# Patient Record
Sex: Male | Born: 1940 | Race: White | Hispanic: No | Marital: Married | Smoking: Never smoker
Health system: Southern US, Community
[De-identification: ages and names within clinical notes are randomized; demographics above are authoritative.]

## PROBLEM LIST (undated history)

## (undated) DIAGNOSIS — I5042 Chronic combined systolic (congestive) and diastolic (congestive) heart failure: Secondary | ICD-10-CM

## (undated) DIAGNOSIS — E785 Hyperlipidemia, unspecified: Secondary | ICD-10-CM

## (undated) DIAGNOSIS — N419 Inflammatory disease of prostate, unspecified: Secondary | ICD-10-CM

## (undated) DIAGNOSIS — N400 Enlarged prostate without lower urinary tract symptoms: Secondary | ICD-10-CM

## (undated) DIAGNOSIS — C4490 Unspecified malignant neoplasm of skin, unspecified: Secondary | ICD-10-CM

## (undated) DIAGNOSIS — M6530 Trigger finger, unspecified finger: Secondary | ICD-10-CM

## (undated) DIAGNOSIS — I2510 Atherosclerotic heart disease of native coronary artery without angina pectoris: Secondary | ICD-10-CM

## (undated) DIAGNOSIS — N200 Calculus of kidney: Secondary | ICD-10-CM

## (undated) HISTORY — DX: Unspecified malignant neoplasm of skin, unspecified: C44.90

## (undated) HISTORY — PX: CARDIAC CATHETERIZATION: SHX172

## (undated) HISTORY — DX: Calculus of kidney: N20.0

## (undated) HISTORY — DX: Chronic combined systolic (congestive) and diastolic (congestive) heart failure: I50.42

## (undated) HISTORY — DX: Trigger finger, unspecified finger: M65.30

## (undated) HISTORY — DX: Benign prostatic hyperplasia without lower urinary tract symptoms: N40.0

## (undated) HISTORY — DX: Hyperlipidemia, unspecified: E78.5

---

## 2005-03-11 ENCOUNTER — Encounter: Admission: RE | Admit: 2005-03-11 | Discharge: 2005-03-11 | Payer: Self-pay | Admitting: Family Medicine

## 2007-07-09 DIAGNOSIS — C4492 Squamous cell carcinoma of skin, unspecified: Secondary | ICD-10-CM

## 2007-07-09 HISTORY — DX: Squamous cell carcinoma of skin, unspecified: C44.92

## 2011-02-20 ENCOUNTER — Emergency Department (HOSPITAL_COMMUNITY)
Admission: EM | Admit: 2011-02-20 | Discharge: 2011-02-21 | Disposition: A | Discharge status: Home / Self Care | Payer: Medicare Other | Attending: Emergency Medicine | Admitting: Emergency Medicine

## 2011-02-20 DIAGNOSIS — R109 Unspecified abdominal pain: Secondary | ICD-10-CM | POA: Insufficient documentation

## 2011-02-20 DIAGNOSIS — N400 Enlarged prostate without lower urinary tract symptoms: Secondary | ICD-10-CM | POA: Insufficient documentation

## 2011-02-20 DIAGNOSIS — N201 Calculus of ureter: Secondary | ICD-10-CM | POA: Insufficient documentation

## 2011-02-20 DIAGNOSIS — R3129 Other microscopic hematuria: Secondary | ICD-10-CM | POA: Insufficient documentation

## 2011-02-21 ENCOUNTER — Emergency Department (HOSPITAL_COMMUNITY): Payer: Medicare Other

## 2011-02-21 ENCOUNTER — Encounter (HOSPITAL_COMMUNITY): Payer: Self-pay

## 2011-02-21 LAB — URINALYSIS, ROUTINE W REFLEX MICROSCOPIC
Ketones, ur: NEGATIVE mg/dL
Protein, ur: NEGATIVE mg/dL
Urobilinogen, UA: 0.2 mg/dL (ref 0.0–1.0)

## 2011-02-21 LAB — URINE MICROSCOPIC-ADD ON

## 2011-02-23 LAB — URINE CULTURE
Colony Count: 50000
Culture  Setup Time: 201206140453

## 2011-12-04 ENCOUNTER — Other Ambulatory Visit: Payer: Self-pay | Admitting: Family Medicine

## 2011-12-04 ENCOUNTER — Ambulatory Visit
Admission: RE | Admit: 2011-12-04 | Discharge: 2011-12-04 | Disposition: A | Discharge status: Home / Self Care | Payer: Medicare Other | Source: Ambulatory Visit | Attending: Family Medicine | Admitting: Family Medicine

## 2011-12-04 DIAGNOSIS — R1084 Generalized abdominal pain: Secondary | ICD-10-CM

## 2011-12-04 MED ORDER — IOHEXOL 300 MG/ML  SOLN
100.0000 mL | Freq: Once | INTRAMUSCULAR | Status: AC | PRN
  Administered 2011-12-04: 100 mL via INTRAVENOUS

## 2014-03-23 ENCOUNTER — Encounter: Payer: Self-pay | Admitting: *Deleted

## 2015-11-06 DIAGNOSIS — H35371 Puckering of macula, right eye: Secondary | ICD-10-CM | POA: Diagnosis not present

## 2015-11-06 DIAGNOSIS — Z961 Presence of intraocular lens: Secondary | ICD-10-CM | POA: Diagnosis not present

## 2015-11-09 DIAGNOSIS — H35371 Puckering of macula, right eye: Secondary | ICD-10-CM | POA: Diagnosis not present

## 2015-11-12 DIAGNOSIS — R358 Other polyuria: Secondary | ICD-10-CM | POA: Diagnosis not present

## 2015-11-24 DIAGNOSIS — N410 Acute prostatitis: Secondary | ICD-10-CM | POA: Diagnosis not present

## 2015-11-24 DIAGNOSIS — N200 Calculus of kidney: Secondary | ICD-10-CM | POA: Diagnosis not present

## 2015-11-24 DIAGNOSIS — N401 Enlarged prostate with lower urinary tract symptoms: Secondary | ICD-10-CM | POA: Diagnosis not present

## 2015-11-24 DIAGNOSIS — R350 Frequency of micturition: Secondary | ICD-10-CM | POA: Diagnosis not present

## 2015-11-24 DIAGNOSIS — R972 Elevated prostate specific antigen [PSA]: Secondary | ICD-10-CM | POA: Diagnosis not present

## 2015-11-24 DIAGNOSIS — N3941 Urge incontinence: Secondary | ICD-10-CM | POA: Diagnosis not present

## 2015-11-24 DIAGNOSIS — R3915 Urgency of urination: Secondary | ICD-10-CM | POA: Diagnosis not present

## 2015-12-04 DIAGNOSIS — N419 Inflammatory disease of prostate, unspecified: Secondary | ICD-10-CM | POA: Diagnosis not present

## 2015-12-11 DIAGNOSIS — R339 Retention of urine, unspecified: Secondary | ICD-10-CM | POA: Diagnosis not present

## 2015-12-11 DIAGNOSIS — R338 Other retention of urine: Secondary | ICD-10-CM | POA: Diagnosis not present

## 2015-12-11 DIAGNOSIS — N401 Enlarged prostate with lower urinary tract symptoms: Secondary | ICD-10-CM | POA: Diagnosis not present

## 2015-12-13 DIAGNOSIS — N400 Enlarged prostate without lower urinary tract symptoms: Secondary | ICD-10-CM | POA: Diagnosis not present

## 2015-12-13 DIAGNOSIS — R195 Other fecal abnormalities: Secondary | ICD-10-CM | POA: Diagnosis not present

## 2015-12-13 DIAGNOSIS — K5909 Other constipation: Secondary | ICD-10-CM | POA: Diagnosis not present

## 2015-12-13 DIAGNOSIS — R1030 Lower abdominal pain, unspecified: Secondary | ICD-10-CM | POA: Diagnosis not present

## 2015-12-15 DIAGNOSIS — N401 Enlarged prostate with lower urinary tract symptoms: Secondary | ICD-10-CM | POA: Diagnosis not present

## 2015-12-15 DIAGNOSIS — R339 Retention of urine, unspecified: Secondary | ICD-10-CM | POA: Diagnosis not present

## 2015-12-19 DIAGNOSIS — R339 Retention of urine, unspecified: Secondary | ICD-10-CM | POA: Diagnosis not present

## 2015-12-21 DIAGNOSIS — F419 Anxiety disorder, unspecified: Secondary | ICD-10-CM | POA: Diagnosis not present

## 2015-12-21 DIAGNOSIS — R339 Retention of urine, unspecified: Secondary | ICD-10-CM | POA: Diagnosis not present

## 2015-12-21 DIAGNOSIS — K5900 Constipation, unspecified: Secondary | ICD-10-CM | POA: Diagnosis not present

## 2015-12-21 DIAGNOSIS — N400 Enlarged prostate without lower urinary tract symptoms: Secondary | ICD-10-CM | POA: Diagnosis not present

## 2015-12-27 DIAGNOSIS — Z1211 Encounter for screening for malignant neoplasm of colon: Secondary | ICD-10-CM | POA: Diagnosis not present

## 2015-12-27 DIAGNOSIS — K5900 Constipation, unspecified: Secondary | ICD-10-CM | POA: Diagnosis not present

## 2016-01-03 ENCOUNTER — Encounter (HOSPITAL_COMMUNITY): Payer: Self-pay

## 2016-01-03 ENCOUNTER — Emergency Department (HOSPITAL_COMMUNITY)
Admission: EM | Admit: 2016-01-03 | Discharge: 2016-01-03 | Disposition: A | Discharge status: Home / Self Care | Payer: Medicare Other | Attending: Emergency Medicine | Admitting: Emergency Medicine

## 2016-01-03 ENCOUNTER — Emergency Department (HOSPITAL_COMMUNITY): Payer: Medicare Other

## 2016-01-03 DIAGNOSIS — R339 Retention of urine, unspecified: Secondary | ICD-10-CM | POA: Insufficient documentation

## 2016-01-03 DIAGNOSIS — Z7982 Long term (current) use of aspirin: Secondary | ICD-10-CM | POA: Insufficient documentation

## 2016-01-03 DIAGNOSIS — R109 Unspecified abdominal pain: Secondary | ICD-10-CM | POA: Diagnosis not present

## 2016-01-03 DIAGNOSIS — N401 Enlarged prostate with lower urinary tract symptoms: Secondary | ICD-10-CM | POA: Insufficient documentation

## 2016-01-03 DIAGNOSIS — N200 Calculus of kidney: Secondary | ICD-10-CM | POA: Insufficient documentation

## 2016-01-03 DIAGNOSIS — Z85828 Personal history of other malignant neoplasm of skin: Secondary | ICD-10-CM | POA: Diagnosis not present

## 2016-01-03 DIAGNOSIS — K5900 Constipation, unspecified: Secondary | ICD-10-CM | POA: Insufficient documentation

## 2016-01-03 DIAGNOSIS — Z8739 Personal history of other diseases of the musculoskeletal system and connective tissue: Secondary | ICD-10-CM | POA: Diagnosis not present

## 2016-01-03 DIAGNOSIS — Z79899 Other long term (current) drug therapy: Secondary | ICD-10-CM | POA: Insufficient documentation

## 2016-01-03 DIAGNOSIS — E785 Hyperlipidemia, unspecified: Secondary | ICD-10-CM | POA: Insufficient documentation

## 2016-01-03 HISTORY — DX: Inflammatory disease of prostate, unspecified: N41.9

## 2016-01-03 LAB — CBC WITH DIFFERENTIAL/PLATELET
BASOS ABS: 0 10*3/uL (ref 0.0–0.1)
Basophils Relative: 0 %
Eosinophils Absolute: 0.1 10*3/uL (ref 0.0–0.7)
Eosinophils Relative: 1 %
HEMATOCRIT: 41.3 % (ref 39.0–52.0)
Hemoglobin: 14.1 g/dL (ref 13.0–17.0)
LYMPHS PCT: 15 %
Lymphs Abs: 1.6 10*3/uL (ref 0.7–4.0)
MCH: 30.3 pg (ref 26.0–34.0)
MCHC: 34.1 g/dL (ref 30.0–36.0)
MCV: 88.6 fL (ref 78.0–100.0)
MONO ABS: 0.9 10*3/uL (ref 0.1–1.0)
MONOS PCT: 8 %
NEUTROS ABS: 8.5 10*3/uL — AB (ref 1.7–7.7)
Neutrophils Relative %: 76 %
Platelets: 173 10*3/uL (ref 150–400)
RBC: 4.66 MIL/uL (ref 4.22–5.81)
RDW: 13 % (ref 11.5–15.5)
WBC: 11.1 10*3/uL — ABNORMAL HIGH (ref 4.0–10.5)

## 2016-01-03 LAB — COMPREHENSIVE METABOLIC PANEL
ALK PHOS: 53 U/L (ref 38–126)
ALT: 19 U/L (ref 17–63)
AST: 19 U/L (ref 15–41)
Albumin: 4.8 g/dL (ref 3.5–5.0)
Anion gap: 12 (ref 5–15)
BILIRUBIN TOTAL: 0.7 mg/dL (ref 0.3–1.2)
BUN: 25 mg/dL — AB (ref 6–20)
CALCIUM: 9.7 mg/dL (ref 8.9–10.3)
CO2: 26 mmol/L (ref 22–32)
Chloride: 103 mmol/L (ref 101–111)
Creatinine, Ser: 1.47 mg/dL — ABNORMAL HIGH (ref 0.61–1.24)
GFR calc Af Amer: 52 mL/min — ABNORMAL LOW (ref >60)
GFR, EST NON AFRICAN AMERICAN: 45 mL/min — AB (ref >60)
Glucose, Bld: 132 mg/dL — ABNORMAL HIGH (ref 65–99)
POTASSIUM: 4.5 mmol/L (ref 3.5–5.1)
Sodium: 141 mmol/L (ref 135–145)
TOTAL PROTEIN: 7.3 g/dL (ref 6.5–8.1)

## 2016-01-03 LAB — URINALYSIS, ROUTINE W REFLEX MICROSCOPIC
Bilirubin Urine: NEGATIVE
GLUCOSE, UA: NEGATIVE mg/dL
Ketones, ur: NEGATIVE mg/dL
Nitrite: NEGATIVE
PH: 5.5 (ref 5.0–8.0)
Protein, ur: NEGATIVE mg/dL
Specific Gravity, Urine: 1.025 (ref 1.005–1.030)

## 2016-01-03 LAB — URINE MICROSCOPIC-ADD ON
Bacteria, UA: NONE SEEN
Squamous Epithelial / LPF: NONE SEEN

## 2016-01-03 LAB — LIPASE, BLOOD: LIPASE: 29 U/L (ref 11–51)

## 2016-01-03 MED ORDER — KETOROLAC TROMETHAMINE 30 MG/ML IJ SOLN: 30.0000 mg | Freq: Once | INTRAMUSCULAR | Status: DC

## 2016-01-03 MED ORDER — HYDROCODONE-ACETAMINOPHEN 5-325 MG PO TABS: 0.5000 | ORAL_TABLET | Freq: Four times a day (QID) | ORAL | Status: DC | PRN

## 2016-01-03 MED ORDER — ONDANSETRON HCL 4 MG PO TABS: 4.0000 mg | ORAL_TABLET | Freq: Three times a day (TID) | ORAL | Status: DC | PRN

## 2016-01-03 MED ORDER — DOCUSATE SODIUM 100 MG PO CAPS: 100.0000 mg | ORAL_CAPSULE | Freq: Two times a day (BID) | ORAL | Status: DC

## 2016-01-03 MED ORDER — HYDROCODONE-ACETAMINOPHEN 5-325 MG PO TABS
0.5000 | ORAL_TABLET | Freq: Once | ORAL | Status: AC
  Administered 2016-01-03: 0.5 via ORAL
  Filled 2016-01-03: qty 1

## 2016-01-03 MED ORDER — KETOROLAC TROMETHAMINE 30 MG/ML IJ SOLN
15.0000 mg | Freq: Once | INTRAMUSCULAR | Status: AC
  Administered 2016-01-03: 15 mg via INTRAVENOUS
  Filled 2016-01-03: qty 1

## 2016-01-03 MED ORDER — ONDANSETRON HCL 4 MG/2ML IJ SOLN
4.0000 mg | Freq: Once | INTRAMUSCULAR | Status: AC
  Administered 2016-01-03: 4 mg via INTRAVENOUS
  Filled 2016-01-03: qty 2

## 2016-01-03 MED ORDER — FENTANYL CITRATE (PF) 100 MCG/2ML IJ SOLN
50.0000 ug | Freq: Once | INTRAMUSCULAR | Status: AC
  Administered 2016-01-03: 50 ug via INTRAVENOUS
  Filled 2016-01-03: qty 2

## 2016-01-03 MED ORDER — SODIUM CHLORIDE 0.9 % IV BOLUS (SEPSIS)
1000.0000 mL | Freq: Once | INTRAVENOUS | Status: AC
  Administered 2016-01-03: 1000 mL via INTRAVENOUS

## 2016-01-03 MED ORDER — IBUPROFEN 400 MG PO TABS: 400.0000 mg | ORAL_TABLET | Freq: Three times a day (TID) | ORAL | Status: AC

## 2016-01-03 NOTE — ED Notes (Signed)
Patient c/o right flank pain since yesterday. Patient has a history of kidney stones. Patient has prostatitis and has been doing self cathing at home.

## 2016-01-03 NOTE — ED Provider Notes (Signed)
CSN: [id number]     Arrival date & time 01/03/16  [id number] History   First MD Initiated Contact with Patient 01/03/16 [phone]     Chief Complaint  Patient presents with  . Flank Pain     (Consider location/radiation/quality/duration/timing/severity/associated sxs/prior Treatment) Patient is a 75 y.o. James [name] presenting with flank pain.  Flank Pain This is a new problem. The current episode started 6 to 12 hours ago. The problem occurs constantly. The problem has been gradually worsening. Associated symptoms include abdominal pain. Pertinent negatives include no chest pain and no shortness of breath. He has tried nothing for the symptoms.    Past Medical History  Diagnosis Date  . Hyperlipidemia   . Kidney stone   . BPH (benign prostatic hypertrophy)   . Skin cancer   . Trigger finger   . Prostatitis    No past surgical history on file. Family History  Problem Relation Age of Onset  . Ovarian cancer Mother   . Heart disease Neg Hx    Social History  Substance Use Topics  . Smoking status: Never Smoker   . Smokeless tobacco: Never Used  . Alcohol Use: No    Review of Systems  Respiratory: Negative for cough and shortness of breath.   Cardiovascular: Negative for chest pain.  Gastrointestinal: Positive for nausea, abdominal pain and constipation. Negative for vomiting.  Genitourinary: Positive for flank pain.  Musculoskeletal: Negative for back pain and gait problem.  All other systems reviewed and are negative.     Allergies  Dicyclomine hcl and Sulfa antibiotics  Home Medications   Prior to Admission medications   Medication Sig Start Date End Date Taking? Authorizing Provider  ALPRAZolam (XANAX) 0.25 MG tablet Take 0.25 mg by mouth 3 (three) times daily as needed. 12/21/15  Yes Historical Provider, MD  aspirin EC 81 MG tablet Take 81 mg by mouth daily.   Yes Historical Provider, MD  atorvastatin (LIPITOR) 10 MG tablet Take 10 mg by mouth daily at 6 PM.    Yes Historical  Provider, MD  Multiple Vitamin (MULTIVITAMIN WITH MINERALS) TABS tablet Take 1 tablet by mouth daily.   Yes Historical Provider, MD  Omega-3 Fatty Acids (FISH OIL) 1000 MG CAPS Take 1,000 mg by mouth at bedtime.    Yes Historical Provider, MD  tamsulosin (FLOMAX) 0.4 MG CAPS capsule Take 0.4 mg by mouth 2 (two) times daily.    Yes Historical Provider, MD  docusate sodium (COLACE) 100 MG capsule Take 1 capsule (100 mg total) by mouth every 12 (twelve) hours. 01/03/16   [name], MD  HYDROcodone-acetaminophen (NORCO/VICODIN) 5-325 MG tablet Take 0.5-1 tablets by mouth every 6 (six) hours as needed for severe pain. 01/03/16   [name], MD  ibuprofen (ADVIL,MOTRIN) 400 MG tablet Take 1 tablet (400 mg total) by mouth 3 (three) times daily. 01/03/16 01/10/16  [name], MD  ondansetron (ZOFRAN) 4 MG tablet Take 1 tablet (4 mg total) by mouth every 8 (eight) hours as needed for nausea or vomiting. 01/03/16   [name], MD   BP 128/74 mmHg  Pulse 78  Temp(Src) 98.3 F (36.8 C) (Oral)  Resp 16  Ht 5\' 7"  (1.702 m)  Wt 178 lb (80.74 kg)  BMI 27.87 kg/m2  SpO2 100% Physical Exam  Constitutional: He is oriented to person, place, and time. He appears well-developed and well-nourished.  HENT:  Head: Normocephalic and atraumatic.  Neck: Normal range of motion.  Cardiovascular: Normal rate.   Pulmonary/Chest: Effort normal. No  respiratory distress. He has no wheezes.  Abdominal: Soft. He exhibits no distension. There is no tenderness. There is no rebound.  Musculoskeletal: Normal range of motion. He exhibits no edema or tenderness.  Neurological: He is alert and oriented to person, place, and time. No cranial nerve deficit. Coordination normal.  Skin: Skin is warm and dry.  Nursing note and vitals reviewed.   ED Course  Procedures (including critical care time) Labs Review Labs Reviewed  URINALYSIS, ROUTINE W REFLEX MICROSCOPIC (NOT AT [facility]) - Abnormal; Notable for the following:     APPearance CLOUDY (*)    Hgb urine dipstick LARGE (*)    Leukocytes, UA SMALL (*)    All other components within normal limits  CBC WITH DIFFERENTIAL/PLATELET - Abnormal; Notable for the following:    WBC 11.1 (*)    Neutro Abs 8.5 (*)    All other components within normal limits  COMPREHENSIVE METABOLIC PANEL - Abnormal; Notable for the following:    Glucose, Bld 132 (*)    BUN 25 (*)    Creatinine, Ser 1.47 (*)    GFR calc non Af Amer 45 (*)    GFR calc Af Amer 52 (*)    All other components within normal limits  LIPASE, BLOOD  URINE MICROSCOPIC-ADD ON    Imaging Review Ct Renal Stone Study  01/03/2016  CLINICAL DATA:  Left-sided pain started this morning, right-sided flank pain, vomiting. EXAM: CT ABDOMEN AND PELVIS WITHOUT CONTRAST TECHNIQUE: Multidetector CT imaging of the abdomen and pelvis was performed following the standard protocol without IV contrast. COMPARISON:  12/04/2011. FINDINGS: Lower chest: Lung bases show patchy ground-glass in the right lower lobe with dependent atelectasis. Heart size normal. No pericardial or pleural effusion. Hepatobiliary: Liver and gallbladder are unremarkable. No biliary ductal dilatation. Pancreas: Negative. Spleen: Negative. Adrenals/Urinary Tract: Adrenal glands are unremarkable. Right renal edema with moderate to severe right hydronephrosis secondary to 1 or 2 stones collectively measuring 10 mm at the right ureterovesical junction. No additional urinary calculi. Sub cm low-attenuation lesion off the lower pole left kidney, too small to characterize. Left ureter is decompressed. Bladder is low in volume. Stomach/Bowel: Stomach, small bowel and colon are unremarkable. Appendix is not readily visualized. Vascular/Lymphatic: Vascular structures are unremarkable. No pathologically enlarged lymph nodes. Reproductive: Prostate is rather enlarged. Other: No free fluid.  Mesenteries and peritoneum are unremarkable. 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 No worrisome lytic or sclerotic lesions. IMPRESSION: 1. Moderate to severe right hydronephrosis secondary to a stone or stones at the right ureterovesical junction, collectively measuring 10 mm. 2. Patchy ground-glass in the right lower lobe, likely infectious or inflammatory in etiology. 3. Enlarged prostate. Electronically Signed   By: Lorin Picket M.D.   On: 01/03/2016 09:01   I have personally reviewed and evaluated these images and lab results as part of my medical decision-making.   EKG Interpretation None      MDM   Final diagnoses:  Kidney stone   R flank pain, constipation, new urinary retention (h/o BPH).  eval for cystitis/pyelo, nephrolithiasis.  Does have kidney stones as likely cause for symptoms. Has BPH and constipation as well. Will continue miralax, and increase fluid intake. Will place foley so that he can increase fluid intake (been doing in and out caths for a week now anyway). Already has urology follow up on Friday, will reevaluate need for foley there.  Will return here for new/worsening symptoms.   New Prescriptions: Discharge Medication List  as of 01/03/2016 11:55 AM    START taking these medications   Details  docusate sodium (COLACE) 100 MG capsule Take 1 capsule (100 mg total) by mouth every 12 (twelve) hours., Starting 01/03/2016, Until Discontinued, Print    HYDROcodone-acetaminophen (NORCO/VICODIN) 5-325 MG tablet Take 0.5-1 tablets by mouth every 6 (six) hours as needed for severe pain., Starting 01/03/2016, Until Discontinued, Print    ibuprofen (ADVIL,MOTRIN) 400 MG tablet Take 1 tablet (400 mg total) by mouth 3 (three) times daily., Starting 01/03/2016, Until Wed 01/10/16, Print    ondansetron (ZOFRAN) 4 MG tablet Take 1 tablet (4 mg total) by mouth every 8 (eight) hours as needed for nausea or vomiting., Starting 01/03/2016, Until Discontinued, Print         I have personally and contemperaneously reviewed labs and  imaging and used in my decision making as above.   A medical screening exam was performed and I feel the patient has had an appropriate workup for their chief complaint at this time and likelihood of emergent condition existing is low. Their vital signs are stable. They have been counseled on decision, discharge, follow up and which symptoms necessitate immediate return to the emergency department.  They verbally stated understanding and agreement with plan and discharged in stable condition.      [name], MD 01/03/16 1538

## 2016-01-03 NOTE — Progress Notes (Signed)
Pt confirmed pcp in EPIC updated

## 2016-01-03 NOTE — ED Notes (Signed)
MD at bedside. EDP MESNER PRESENT. GINGER ALE GIVEN

## 2016-01-05 DIAGNOSIS — N201 Calculus of ureter: Secondary | ICD-10-CM | POA: Diagnosis not present

## 2016-01-07 DIAGNOSIS — N368 Other specified disorders of urethra: Secondary | ICD-10-CM | POA: Diagnosis not present

## 2016-01-07 DIAGNOSIS — R300 Dysuria: Secondary | ICD-10-CM | POA: Diagnosis not present

## 2016-01-09 DIAGNOSIS — Z882 Allergy status to sulfonamides status: Secondary | ICD-10-CM | POA: Diagnosis not present

## 2016-01-09 DIAGNOSIS — N201 Calculus of ureter: Secondary | ICD-10-CM | POA: Diagnosis not present

## 2016-01-09 DIAGNOSIS — I10 Essential (primary) hypertension: Secondary | ICD-10-CM | POA: Diagnosis not present

## 2016-01-09 DIAGNOSIS — R338 Other retention of urine: Secondary | ICD-10-CM | POA: Diagnosis not present

## 2016-01-09 DIAGNOSIS — N411 Chronic prostatitis: Secondary | ICD-10-CM | POA: Diagnosis not present

## 2016-01-09 DIAGNOSIS — R972 Elevated prostate specific antigen [PSA]: Secondary | ICD-10-CM | POA: Diagnosis not present

## 2016-01-09 DIAGNOSIS — N138 Other obstructive and reflux uropathy: Secondary | ICD-10-CM | POA: Diagnosis not present

## 2016-01-09 DIAGNOSIS — Z888 Allergy status to other drugs, medicaments and biological substances status: Secondary | ICD-10-CM | POA: Diagnosis not present

## 2016-01-09 DIAGNOSIS — N401 Enlarged prostate with lower urinary tract symptoms: Secondary | ICD-10-CM | POA: Diagnosis not present

## 2016-01-09 DIAGNOSIS — Z87442 Personal history of urinary calculi: Secondary | ICD-10-CM | POA: Diagnosis not present

## 2016-01-10 ENCOUNTER — Ambulatory Visit
Admission: RE | Admit: 2016-01-10 | Discharge: 2016-01-10 | Disposition: A | Discharge status: Home / Self Care | Payer: Medicare Other | Source: Ambulatory Visit | Attending: Physician Assistant | Admitting: Physician Assistant

## 2016-01-10 ENCOUNTER — Other Ambulatory Visit: Payer: Self-pay | Admitting: Physician Assistant

## 2016-01-10 DIAGNOSIS — K5900 Constipation, unspecified: Secondary | ICD-10-CM

## 2016-01-10 DIAGNOSIS — N200 Calculus of kidney: Secondary | ICD-10-CM | POA: Diagnosis not present

## 2016-01-10 DIAGNOSIS — R1084 Generalized abdominal pain: Secondary | ICD-10-CM | POA: Diagnosis not present

## 2016-01-10 DIAGNOSIS — K5904 Chronic idiopathic constipation: Secondary | ICD-10-CM | POA: Diagnosis not present

## 2016-01-12 DIAGNOSIS — R339 Retention of urine, unspecified: Secondary | ICD-10-CM | POA: Diagnosis not present

## 2016-01-14 ENCOUNTER — Emergency Department (HOSPITAL_COMMUNITY): Payer: Medicare Other

## 2016-01-14 ENCOUNTER — Emergency Department (HOSPITAL_COMMUNITY)
Admission: EM | Admit: 2016-01-14 | Discharge: 2016-01-14 | Disposition: A | Discharge status: Home / Self Care | Payer: Medicare Other | Attending: Emergency Medicine | Admitting: Emergency Medicine

## 2016-01-14 ENCOUNTER — Encounter (HOSPITAL_COMMUNITY): Payer: Self-pay | Admitting: Emergency Medicine

## 2016-01-14 DIAGNOSIS — Z7982 Long term (current) use of aspirin: Secondary | ICD-10-CM | POA: Insufficient documentation

## 2016-01-14 DIAGNOSIS — Z85828 Personal history of other malignant neoplasm of skin: Secondary | ICD-10-CM | POA: Diagnosis not present

## 2016-01-14 DIAGNOSIS — M25511 Pain in right shoulder: Secondary | ICD-10-CM | POA: Diagnosis not present

## 2016-01-14 DIAGNOSIS — Z79891 Long term (current) use of opiate analgesic: Secondary | ICD-10-CM | POA: Diagnosis not present

## 2016-01-14 DIAGNOSIS — J189 Pneumonia, unspecified organism: Secondary | ICD-10-CM | POA: Diagnosis not present

## 2016-01-14 DIAGNOSIS — E785 Hyperlipidemia, unspecified: Secondary | ICD-10-CM | POA: Insufficient documentation

## 2016-01-14 DIAGNOSIS — N400 Enlarged prostate without lower urinary tract symptoms: Secondary | ICD-10-CM | POA: Diagnosis not present

## 2016-01-14 DIAGNOSIS — R918 Other nonspecific abnormal finding of lung field: Secondary | ICD-10-CM | POA: Diagnosis not present

## 2016-01-14 DIAGNOSIS — Z79899 Other long term (current) drug therapy: Secondary | ICD-10-CM | POA: Insufficient documentation

## 2016-01-14 DIAGNOSIS — R1011 Right upper quadrant pain: Secondary | ICD-10-CM | POA: Diagnosis not present

## 2016-01-14 LAB — CBC
HEMATOCRIT: 38.4 % — AB (ref 39.0–52.0)
HEMOGLOBIN: 13.3 g/dL (ref 13.0–17.0)
MCH: 30.9 pg (ref 26.0–34.0)
MCHC: 34.6 g/dL (ref 30.0–36.0)
MCV: 89.1 fL (ref 78.0–100.0)
Platelets: 168 10*3/uL (ref 150–400)
RBC: 4.31 MIL/uL (ref 4.22–5.81)
RDW: 12.9 % (ref 11.5–15.5)
WBC: 9.7 10*3/uL (ref 4.0–10.5)

## 2016-01-14 LAB — COMPREHENSIVE METABOLIC PANEL
ALBUMIN: 4.2 g/dL (ref 3.5–5.0)
ALT: 17 U/L (ref 17–63)
ANION GAP: 10 (ref 5–15)
AST: 15 U/L (ref 15–41)
Alkaline Phosphatase: 58 U/L (ref 38–126)
BUN: 15 mg/dL (ref 6–20)
CO2: 27 mmol/L (ref 22–32)
Calcium: 9.2 mg/dL (ref 8.9–10.3)
Chloride: 101 mmol/L (ref 101–111)
Creatinine, Ser: 0.96 mg/dL (ref 0.61–1.24)
GFR calc Af Amer: >60 mL/min (ref >60)
GFR calc non Af Amer: >60 mL/min (ref >60)
GLUCOSE: 115 mg/dL — AB (ref 65–99)
POTASSIUM: 4.2 mmol/L (ref 3.5–5.1)
SODIUM: 138 mmol/L (ref 135–145)
Total Bilirubin: 1.1 mg/dL (ref 0.3–1.2)
Total Protein: 7.3 g/dL (ref 6.5–8.1)

## 2016-01-14 LAB — URINALYSIS, ROUTINE W REFLEX MICROSCOPIC
BILIRUBIN URINE: NEGATIVE
GLUCOSE, UA: NEGATIVE mg/dL
HGB URINE DIPSTICK: NEGATIVE
Ketones, ur: NEGATIVE mg/dL
Leukocytes, UA: NEGATIVE
Nitrite: NEGATIVE
PH: 7.5 (ref 5.0–8.0)
PROTEIN: NEGATIVE mg/dL
SPECIFIC GRAVITY, URINE: 1.018 (ref 1.005–1.030)

## 2016-01-14 LAB — LIPASE, BLOOD: Lipase: 24 U/L (ref 11–51)

## 2016-01-14 MED ORDER — DIATRIZOATE MEGLUMINE & SODIUM 66-10 % PO SOLN
30.0000 mL | Freq: Once | ORAL | Status: AC
  Administered 2016-01-14: 30 mL via ORAL

## 2016-01-14 MED ORDER — HYDROCODONE-ACETAMINOPHEN 5-325 MG PO TABS: 2.0000 | ORAL_TABLET | ORAL | Status: DC | PRN

## 2016-01-14 MED ORDER — LEVOFLOXACIN 750 MG PO TABS
750.0000 mg | ORAL_TABLET | Freq: Once | ORAL | Status: AC
  Administered 2016-01-14: 750 mg via ORAL
  Filled 2016-01-14: qty 1

## 2016-01-14 MED ORDER — MORPHINE SULFATE (PF) 4 MG/ML IV SOLN
4.0000 mg | Freq: Once | INTRAVENOUS | Status: AC
  Administered 2016-01-14: 4 mg via INTRAVENOUS
  Filled 2016-01-14: qty 1

## 2016-01-14 MED ORDER — LEVOFLOXACIN 750 MG PO TABS: 750.0000 mg | ORAL_TABLET | Freq: Every day | ORAL | Status: DC

## 2016-01-14 MED ORDER — IOPAMIDOL (ISOVUE-300) INJECTION 61%
100.0000 mL | Freq: Once | INTRAVENOUS | Status: AC | PRN
  Administered 2016-01-14: 100 mL via INTRAVENOUS

## 2016-01-14 NOTE — Discharge Instructions (Signed)
Community-Acquired Pneumonia, Adult °Pneumonia is an infection of the lungs. There are different types of pneumonia. One type can develop while a person is in a hospital. A different type, called community-acquired pneumonia, develops in people who are not, or have not recently been, in the hospital or other health care facility.  °CAUSES °Pneumonia may be caused by bacteria, viruses, or funguses. Community-acquired pneumonia is often caused by Streptococcus pneumonia bacteria. These bacteria are often passed from one person to another by breathing in droplets from the cough or sneeze of an infected person. °RISK FACTORS °The condition is more likely to develop in: °· People who have chronic diseases, such as chronic obstructive pulmonary disease (COPD), asthma, congestive heart failure, cystic fibrosis, diabetes, or kidney disease. °· People who have early-stage or late-stage HIV. °· People who have sickle cell disease. °· People who have had their spleen removed (splenectomy). °· People who have poor dental hygiene. °· People who have medical conditions that increase the risk of breathing in (aspirating) secretions their own mouth and nose.   °· People who have a weakened immune system (immunocompromised). °· People who smoke. °· People who travel to areas where pneumonia-causing germs commonly exist. °· People who are around animal habitats or animals that have pneumonia-causing germs, including birds, bats, rabbits, cats, and farm animals. °SYMPTOMS °Symptoms of this condition include: °· A dry cough. °· A wet (productive) cough. °· Fever. °· Sweating. °· Chest pain, especially when breathing deeply or coughing. °· Rapid breathing or difficulty breathing. °· Shortness of breath. °· Shaking chills. °· Fatigue. °· Muscle aches. °DIAGNOSIS °Your health care provider will take a medical history and perform a physical exam. You may also have other tests, including: °· Imaging studies of your chest, including  X-rays. °· Tests to check your blood oxygen level and other blood gases. °· Other tests on blood, mucus (sputum), fluid around your lungs (pleural fluid), and urine. °If your pneumonia is severe, other tests may be done to identify the specific cause of your illness. °TREATMENT °The type of treatment that you receive depends on many factors, such as the cause of your pneumonia, the medicines you take, and other medical conditions that you have. For most adults, treatment and recovery from pneumonia may occur at home. In some cases, treatment must happen in a hospital. Treatment may include: °· Antibiotic medicines, if the pneumonia was caused by bacteria. °· Antiviral medicines, if the pneumonia was caused by a virus. °· Medicines that are given by mouth or through an IV tube. °· Oxygen. °· Respiratory therapy. °Although rare, treating severe pneumonia may include: °· Mechanical ventilation. This is done if you are not breathing well on your own and you cannot maintain a safe blood oxygen level. °· Thoracentesis. This procedure removes fluid around one lung or both lungs to help you breathe better. °HOME CARE INSTRUCTIONS °· Take over-the-counter and prescription medicines only as told by your health care provider. °¨ Only take cough medicine if you are losing sleep. Understand that cough medicine can prevent your body's natural ability to remove mucus from your lungs. °¨ If you were prescribed an antibiotic medicine, take it as told by your health care provider. Do not stop taking the antibiotic even if you start to feel better. °· Sleep in a semi-upright position at night. Try sleeping in a reclining chair, or place a few pillows under your head. °· Do not use tobacco products, including cigarettes, chewing tobacco, and e-cigarettes. If you need help quitting, ask your health care provider. °· Drink enough water to keep your urine   clear or pale yellow. This will help to thin out mucus secretions in your  lungs. PREVENTION There are ways that you can decrease your risk of developing community-acquired pneumonia. Consider getting a pneumococcal vaccine if:  You are older than 74 years of age.  You are older than 74 years of age and are undergoing cancer treatment, have chronic lung disease, or have other medical conditions that affect your immune system. Ask your health care provider if this applies to you. There are different types and schedules of pneumococcal vaccines. Ask your health care provider which vaccination option is best for you. You may also prevent community-acquired pneumonia if you take these actions:  Get an influenza vaccine every year. Ask your health care provider which type of influenza vaccine is best for you.  Go to the dentist on a regular basis.  Wash your hands often. Use hand sanitizer if soap and water are not available. SEEK MEDICAL CARE IF:  You have a fever.  You are losing sleep because you cannot control your cough with cough medicine. SEEK IMMEDIATE MEDICAL CARE IF:  You have worsening shortness of breath.  You have increased chest pain.  Your sickness becomes worse, especially if you are an older adult or have a weakened immune system.  You cough up blood.   This information is not intended to replace advice given to you by your health care provider. Make sure you discuss any questions you have with your health care provider.   Take antibiotics as prescribed. Follow up with your primary care doctor as soon as possible for reevaluation. Take pain medication as needed. Please return to the emergency department if you experience severe worsening of your symptoms, difficulty breathing, increased pain, fevers, chills, cough, loss of consciousness.

## 2016-01-14 NOTE — ED Notes (Signed)
Patient transported to CT 

## 2016-01-14 NOTE — ED Notes (Signed)
Recently had a kidney stone and stent placed last week, stent removed on Friday. Woke up today at 0300 with RUQ abd pain radiating into his right shoulder, hurts worse to take a deep breath. Does not appear to be in obvious distress in triage. 99.8 temp in triage, elevated from urgent care an hour ago at 97.7.

## 2016-01-14 NOTE — ED Provider Notes (Signed)
CSN: [id number]     Arrival date & time 01/14/16  1045 History   First MD Initiated Contact with Patient 01/14/16 1120     Chief Complaint  Patient presents with  . Abdominal Pain  . Shoulder Pain     (Consider location/radiation/quality/duration/timing/severity/associated sxs/prior Treatment) HPI  James [name] is a 75 y.o M with a pmhx of HLD, BPH, nephrolithiasis who presents the ED today complaining of right upper quadrant abdominal pain. Patient states when he woke up this morning he had severe right upper quadrant abdominal pain that radiated to his right shoulder. Pain was 8/10 and was worse when he took a deep breath. The pain has since eased off and is now only a dull ache. He reports some nausea but no vomiting. She was seen at urgent care prior to arrival and sent to the ED for further evaluation. Patient also reports that he has severe constipation. He has been passing a lot of gas and small BM yesterday. Patient also require self-catheterization due to his BPH. He does not report any issues surrounding urination. Patient also states that he had a stent placed in his right kidney 6 days ago due to nephrolithiasis which was an uncomplicated procedure and he has not had any issues since. He denies any fevers, chills, diarrhea, melena, hematochezia, chest pain or shortness of breath.  Past Medical History  Diagnosis Date  . Hyperlipidemia   . Kidney stone   . BPH (benign prostatic hypertrophy)   . Skin cancer   . Trigger finger   . Prostatitis    History reviewed. No pertinent past surgical history. Family History  Problem Relation Age of Onset  . Ovarian cancer Mother   . Heart disease Neg Hx    Social History  Substance Use Topics  . Smoking status: Never Smoker   . Smokeless tobacco: Never Used  . Alcohol Use: No    Review of Systems  All other systems reviewed and are negative.     Allergies  Dicyclomine hcl and Sulfa antibiotics  Home Medications   Prior  to Admission medications   Medication Sig Start Date End Date Taking? Authorizing Provider  ALPRAZolam (XANAX) 0.25 MG tablet Take 0.25 mg by mouth 3 (three) times daily as needed. 12/21/15   Historical Provider, MD  aspirin EC 81 MG tablet Take 81 mg by mouth daily.    Historical Provider, MD  atorvastatin (LIPITOR) 10 MG tablet Take 10 mg by mouth daily at 6 PM.     Historical Provider, MD  docusate sodium (COLACE) 100 MG capsule Take 1 capsule (100 mg total) by mouth every 12 (twelve) hours. 01/03/16   [name], MD  HYDROcodone-acetaminophen (NORCO/VICODIN) 5-325 MG tablet Take 0.5-1 tablets by mouth every 6 (six) hours as needed for severe pain. 01/03/16   [name], MD  Multiple Vitamin (MULTIVITAMIN WITH MINERALS) TABS tablet Take 1 tablet by mouth daily.    Historical Provider, MD  Omega-3 Fatty Acids (FISH OIL) 1000 MG CAPS Take 1,000 mg by mouth at bedtime.     Historical Provider, MD  ondansetron (ZOFRAN) 4 MG tablet Take 1 tablet (4 mg total) by mouth every 8 (eight) hours as needed for nausea or vomiting. 01/03/16   [name], MD  tamsulosin (FLOMAX) 0.4 MG CAPS capsule Take 0.4 mg by mouth 2 (two) times daily.     Historical Provider, MD   BP 139/74 mmHg  Pulse 97  Temp(Src) 99.8 F (37.7 C) (Oral)  Resp 18  SpO2 93% Physical Exam  Constitutional: He is oriented to person, place, and time. He appears well-developed and well-nourished. No distress.  HENT:  Head: Normocephalic and atraumatic.  Mouth/Throat: No oropharyngeal exudate.  Eyes: Conjunctivae and EOM are normal. Pupils are equal, round, and reactive to light. Right eye exhibits no discharge. Left eye exhibits no discharge. No scleral icterus.  Neck: Neck supple.  Cardiovascular: Normal rate, regular rhythm, normal heart sounds and intact distal pulses.  Exam reveals no gallop and no friction rub.   No murmur heard. Pulmonary/Chest: Effort normal and breath sounds normal. No respiratory distress. He has no  wheezes. He has no rales. He exhibits tenderness ( Right anterior chest wall).  Abdominal: Soft. Bowel sounds are normal. He exhibits distension. There is no tenderness. There is no guarding.  Musculoskeletal: Normal range of motion. He exhibits no edema.  Lymphadenopathy:    He has no cervical adenopathy.  Neurological: He is alert and oriented to person, place, and time.  Skin: Skin is warm and dry. No rash noted. He is not diaphoretic. No erythema. No pallor.  Psychiatric: He has a normal mood and affect. His behavior is normal.  Nursing note and vitals reviewed.   ED Course  Procedures (including critical care time) Labs Review Labs Reviewed  COMPREHENSIVE METABOLIC PANEL - Abnormal; Notable for the following:    Glucose, Bld 115 (*)    All other components within normal limits  CBC - Abnormal; Notable for the following:    HCT 38.4 (*)    All other components within normal limits  LIPASE, BLOOD  URINALYSIS, ROUTINE W REFLEX MICROSCOPIC (NOT AT [facility])    Imaging Review Dg Chest 2 View  01/14/2016  CLINICAL DATA:  Patient with right upper chest pain for 5 days. EXAM: CHEST  2 VIEW COMPARISON:  CT abdomen pelvis 5 7 2017. FINDINGS: Normal cardiac and mediastinal contours. Low lung volumes. Heterogeneous opacities within the right-greater-than-left lower lungs. No pleural effusion or pneumothorax. Mid thoracic spine degenerative changes. Dense nodular opacity within the right upper hemi thorax. IMPRESSION: Airspace opacities within the right-greater-than-left lower lungs may represent atelectasis or infection. Dense nodular opacity within the right upper hemi thorax may represent overlapping osseous structure or potentially a true pulmonary nodule. Consider evaluation with apical lordotic imaging. Otherwise, chest CT is warranted. Electronically Signed   By: Lovey Newcomer M.D.   On: 01/14/2016 16:11   Ct Chest Wo Contrast  01/14/2016  CLINICAL DATA:  Abnormal chest x-ray.  Evaluate for  possible nodule. EXAM: CT CHEST WITHOUT CONTRAST TECHNIQUE: Multidetector CT imaging of the chest was performed following the standard protocol without IV contrast. COMPARISON:  Chest x-ray and CT of the abdomen and pelvis from earlier today FINDINGS: The questioned nodular density on the chest x-ray from earlier today represents asymmetric degenerative change at the right first costochondral junction. No suspicious pulmonary nodules or masses are identified. Opacity in the right base is more confluent than appreciated on the recent CT of the chest with air bronchograms. These findings are more consistent with infiltrate/pneumonia than atelectasis. The minimal opacity in the left lung base is consist with atelectasis. A very mild opacity in the right middle lobe could represent further infiltrate. No effusions. The heart size is unchanged. The thoracic aorta and pulmonary arteries are normal in caliber. No adenopathy. Evaluation of the upper abdomen is limited but unremarkable. Visualized bones are within normal limits. IMPRESSION: 1. Right basilar and middle lobe opacities are worrisome for infiltrate/pneumonia. Recommend follow-up to  resolution. 2. No suspicious pulmonary nodules are identified. Electronically Signed   By: Dorise Bullion III M.D   On: 01/14/2016 17:49   Ct Abdomen Pelvis W Contrast  01/14/2016  CLINICAL DATA:  The patient had a renal stone and stent placed 1 week ago. Stent was removed 2 days ago. The patient awoke with right upper quadrant pain radiating into the shoulder. Pain is worse with inspiration. EXAM: CT ABDOMEN AND PELVIS WITH CONTRAST TECHNIQUE: Multidetector CT imaging of the abdomen and pelvis was performed using the standard protocol following bolus administration of intravenous contrast. CONTRAST:  176mL ISOVUE-300 IOPAMIDOL (ISOVUE-300) INJECTION 61% COMPARISON:  January 03, 2016 FINDINGS: There is new opacity in the inferior right middle lobe and posteriorly in the right lower  lobe. The lung bases are otherwise normal. No free air or free fluid. The liver, gallbladder, and portal vein are normal. The spleen, pancreas, and adrenal glands are normal. No renal stones, masses, or hydronephrosis. No aneurysm or adenopathy. The stomach and small bowel are normal. There is moderate fecal loading throughout the colon. The colon is redundant and tortuous but otherwise normal. No evidence of appendicitis. There is a fat containing umbilical hernia. The pelvis demonstrates no adenopathy or mass. The prostate is enlarged measuring 5.1 x 7.1 cm. The seminal vesicles are normal. Calcifications in the posterior bladder are likely recently passed stones. The visualized bones are unremarkable. IMPRESSION: 1. Increasing opacity in the right middle and lower lobes. While this could be atelectasis, infiltrate is not excluded. That being said, atelectasis is favored. Recommend clinical correlation and follow-up to resolution. 2. Calcifications dependently in the bladder are likely recently passed renal stones, not seen previously in the bladder on the last study. 3. No on going renal stones or obstruction. 4. Prostate enlargement. Electronically Signed   By: Dorise Bullion III M.D   On: 01/14/2016 14:01   I have personally reviewed and evaluated these images and lab results as part of my medical decision-making.   EKG Interpretation   Date/Time:  Sunday Jan 14 2016 15:24:30 EDT Ventricular Rate:  93 PR Interval:  189 QRS Duration: 83 QT Interval:  343 QTC Calculation: 427 R Axis:   -38 Text Interpretation:  Sinus rhythm Probable left atrial enlargement Left  axis deviation Anteroseptal infarct, age indeterminate No acute changes No  old tracing to compare Confirmed by Kathrynn Humble, MD, Thelma Comp [phone]) on  01/14/2016 3:41:08 PM      MDM   Final diagnoses:  CAP (community acquired pneumonia)    75 year-old male with recent right kidney stent placement presents the ED with acute onset right  upper quadrant abdominal pain that radiates into his right chest, shoulder and back. Pain is pleuritic in nature and worsens with deep breath. Patient appears well in ED, in no apparent distress. Vitals are stable. No labored breathing. Patient states his pain has significantly improved since this morning it is now a dull ache. Concern for possible perinephric abscess given recent procedure. We'll obtain lab work and CT abdomen. All lab work is within normal limits. CT abdomen reveals an increasing opacity in the right middle and lower lobes. No evidence of abscess, renal stones or obstruction. Upon reevaluation of the patient, his pain is significantly worsened especially with deep breaths. Pt is low risk well's criteria. No hypoxia or tachycardia. Doubt PE. EKG obtained which reveals sinus rhythm, no evidence of ischemia. Chest x-ray obtained which shows airspace opacities within the right and left lower lungs, greater on the right.  This may represent atelectasis or infection. There is a dense nodular opacity in the right upper hemithorax as well. CT chest recommended. I spoke with the radiology who states that a CT noncontrast is appropriate in this case. Chest CT obtained which reveals right basilar and middle lobe opacities worrisome for infiltrate and pneumonia. Although this is an atypical presentation of pneumonia. We'll treat with antibiotics, Levaquin. Patient is low risk on his port score, feel that this may be treated as an outpatient. His pain has improved with opioid medication. Patient was ambulated in the ED and maintained his oxygen saturation above 93%. Treatment plan was discussed with patient who is agreeable. He will follow-up with his primary care doctor tomorrow for reevaluation. Prescription for Levaquin given as well as additional pain medication.  Patient was discussed with and seen by Dr. Kathrynn Humble who agrees with the treatment plan.     Dondra Spry [place], PA-C 01/14/16  2017  Varney Biles, MD 01/16/16 [phone]

## 2016-01-14 NOTE — ED Notes (Signed)
Pt stayed around 93% during ambulation

## 2016-01-14 NOTE — ED Notes (Signed)
Pt ambulatory and independent at discharge.  Left with wife.

## 2016-01-19 DIAGNOSIS — J189 Pneumonia, unspecified organism: Secondary | ICD-10-CM | POA: Diagnosis not present

## 2016-01-19 DIAGNOSIS — L259 Unspecified contact dermatitis, unspecified cause: Secondary | ICD-10-CM | POA: Diagnosis not present

## 2016-01-22 DIAGNOSIS — R339 Retention of urine, unspecified: Secondary | ICD-10-CM | POA: Diagnosis not present

## 2016-01-29 DIAGNOSIS — N200 Calculus of kidney: Secondary | ICD-10-CM | POA: Diagnosis not present

## 2016-01-29 DIAGNOSIS — R339 Retention of urine, unspecified: Secondary | ICD-10-CM | POA: Diagnosis not present

## 2016-01-29 DIAGNOSIS — N312 Flaccid neuropathic bladder, not elsewhere classified: Secondary | ICD-10-CM | POA: Diagnosis not present

## 2016-01-29 DIAGNOSIS — N401 Enlarged prostate with lower urinary tract symptoms: Secondary | ICD-10-CM | POA: Diagnosis not present

## 2016-02-06 DIAGNOSIS — L821 Other seborrheic keratosis: Secondary | ICD-10-CM | POA: Diagnosis not present

## 2016-02-06 DIAGNOSIS — D485 Neoplasm of uncertain behavior of skin: Secondary | ICD-10-CM | POA: Diagnosis not present

## 2016-02-06 DIAGNOSIS — D0439 Carcinoma in situ of skin of other parts of face: Secondary | ICD-10-CM | POA: Diagnosis not present

## 2016-02-06 DIAGNOSIS — L570 Actinic keratosis: Secondary | ICD-10-CM | POA: Diagnosis not present

## 2016-02-08 DIAGNOSIS — R339 Retention of urine, unspecified: Secondary | ICD-10-CM | POA: Diagnosis not present

## 2016-02-16 ENCOUNTER — Ambulatory Visit
Admission: RE | Admit: 2016-02-16 | Discharge: 2016-02-16 | Disposition: A | Discharge status: Home / Self Care | Payer: Medicare Other | Source: Ambulatory Visit | Attending: Family Medicine | Admitting: Family Medicine

## 2016-02-16 ENCOUNTER — Other Ambulatory Visit: Payer: Self-pay | Admitting: Family Medicine

## 2016-02-16 DIAGNOSIS — Z09 Encounter for follow-up examination after completed treatment for conditions other than malignant neoplasm: Secondary | ICD-10-CM

## 2016-02-16 DIAGNOSIS — R918 Other nonspecific abnormal finding of lung field: Secondary | ICD-10-CM | POA: Diagnosis not present

## 2016-03-07 DIAGNOSIS — R339 Retention of urine, unspecified: Secondary | ICD-10-CM | POA: Diagnosis not present

## 2016-04-26 DIAGNOSIS — R338 Other retention of urine: Secondary | ICD-10-CM | POA: Diagnosis not present

## 2016-04-26 DIAGNOSIS — N281 Cyst of kidney, acquired: Secondary | ICD-10-CM | POA: Diagnosis not present

## 2016-04-26 DIAGNOSIS — N200 Calculus of kidney: Secondary | ICD-10-CM | POA: Diagnosis not present

## 2016-04-26 DIAGNOSIS — N312 Flaccid neuropathic bladder, not elsewhere classified: Secondary | ICD-10-CM | POA: Diagnosis not present

## 2016-04-26 DIAGNOSIS — N401 Enlarged prostate with lower urinary tract symptoms: Secondary | ICD-10-CM | POA: Diagnosis not present

## 2016-05-01 DIAGNOSIS — R339 Retention of urine, unspecified: Secondary | ICD-10-CM | POA: Diagnosis not present

## 2016-05-02 DIAGNOSIS — L570 Actinic keratosis: Secondary | ICD-10-CM | POA: Diagnosis not present

## 2016-05-02 DIAGNOSIS — D485 Neoplasm of uncertain behavior of skin: Secondary | ICD-10-CM | POA: Diagnosis not present

## 2016-05-02 DIAGNOSIS — C44722 Squamous cell carcinoma of skin of right lower limb, including hip: Secondary | ICD-10-CM | POA: Diagnosis not present

## 2016-06-12 DIAGNOSIS — D485 Neoplasm of uncertain behavior of skin: Secondary | ICD-10-CM | POA: Diagnosis not present

## 2016-06-12 DIAGNOSIS — L570 Actinic keratosis: Secondary | ICD-10-CM | POA: Diagnosis not present

## 2016-06-12 DIAGNOSIS — C44722 Squamous cell carcinoma of skin of right lower limb, including hip: Secondary | ICD-10-CM | POA: Diagnosis not present

## 2016-06-12 DIAGNOSIS — D489 Neoplasm of uncertain behavior, unspecified: Secondary | ICD-10-CM | POA: Diagnosis not present

## 2016-07-03 DIAGNOSIS — Z23 Encounter for immunization: Secondary | ICD-10-CM | POA: Diagnosis not present

## 2016-07-23 DIAGNOSIS — L570 Actinic keratosis: Secondary | ICD-10-CM | POA: Diagnosis not present

## 2016-07-23 DIAGNOSIS — R339 Retention of urine, unspecified: Secondary | ICD-10-CM | POA: Diagnosis not present

## 2016-07-23 DIAGNOSIS — L820 Inflamed seborrheic keratosis: Secondary | ICD-10-CM | POA: Diagnosis not present

## 2016-07-23 DIAGNOSIS — C44722 Squamous cell carcinoma of skin of right lower limb, including hip: Secondary | ICD-10-CM | POA: Diagnosis not present

## 2016-07-23 DIAGNOSIS — D0462 Carcinoma in situ of skin of left upper limb, including shoulder: Secondary | ICD-10-CM | POA: Diagnosis not present

## 2016-07-24 DIAGNOSIS — D492 Neoplasm of unspecified behavior of bone, soft tissue, and skin: Secondary | ICD-10-CM | POA: Diagnosis not present

## 2016-07-24 DIAGNOSIS — C44722 Squamous cell carcinoma of skin of right lower limb, including hip: Secondary | ICD-10-CM | POA: Diagnosis not present

## 2016-07-24 DIAGNOSIS — D0462 Carcinoma in situ of skin of left upper limb, including shoulder: Secondary | ICD-10-CM | POA: Diagnosis not present

## 2016-08-16 DIAGNOSIS — R339 Retention of urine, unspecified: Secondary | ICD-10-CM | POA: Diagnosis not present

## 2016-08-27 DIAGNOSIS — E7800 Pure hypercholesterolemia, unspecified: Secondary | ICD-10-CM | POA: Diagnosis not present

## 2016-08-27 DIAGNOSIS — Z0000 Encounter for general adult medical examination without abnormal findings: Secondary | ICD-10-CM | POA: Diagnosis not present

## 2016-08-27 DIAGNOSIS — F419 Anxiety disorder, unspecified: Secondary | ICD-10-CM | POA: Diagnosis not present

## 2016-08-27 DIAGNOSIS — N400 Enlarged prostate without lower urinary tract symptoms: Secondary | ICD-10-CM | POA: Diagnosis not present

## 2016-09-06 DIAGNOSIS — H6090 Unspecified otitis externa, unspecified ear: Secondary | ICD-10-CM | POA: Diagnosis not present

## 2016-09-18 DIAGNOSIS — R339 Retention of urine, unspecified: Secondary | ICD-10-CM | POA: Diagnosis not present

## 2016-10-18 DIAGNOSIS — R339 Retention of urine, unspecified: Secondary | ICD-10-CM | POA: Diagnosis not present

## 2016-10-28 DIAGNOSIS — N138 Other obstructive and reflux uropathy: Secondary | ICD-10-CM | POA: Diagnosis not present

## 2016-10-28 DIAGNOSIS — N401 Enlarged prostate with lower urinary tract symptoms: Secondary | ICD-10-CM | POA: Diagnosis not present

## 2016-10-28 DIAGNOSIS — N281 Cyst of kidney, acquired: Secondary | ICD-10-CM | POA: Diagnosis not present

## 2016-10-28 DIAGNOSIS — R339 Retention of urine, unspecified: Secondary | ICD-10-CM | POA: Diagnosis not present

## 2016-10-28 DIAGNOSIS — N312 Flaccid neuropathic bladder, not elsewhere classified: Secondary | ICD-10-CM | POA: Diagnosis not present

## 2016-11-15 DIAGNOSIS — R339 Retention of urine, unspecified: Secondary | ICD-10-CM | POA: Diagnosis not present

## 2017-01-13 DIAGNOSIS — R339 Retention of urine, unspecified: Secondary | ICD-10-CM | POA: Diagnosis not present

## 2017-02-07 DIAGNOSIS — H6990 Unspecified Eustachian tube disorder, unspecified ear: Secondary | ICD-10-CM | POA: Diagnosis not present

## 2017-02-21 IMAGING — CT CT ABD-PELV W/ CM
2 of 5 series · 16 of 46 positions shown, 18 images · IV contrast (ISOVUE)
Comparison: January 03, 2016

CLINICAL DATA: The patient had a renal stone and stent placed 1
week ago. Stent was removed 2 days ago. The patient awoke with right
upper quadrant pain radiating into the shoulder. Pain is worse with
inspiration.

EXAM:
CT ABDOMEN AND PELVIS WITH CONTRAST
TECHNIQUE: Multidetector CT imaging of the abdomen and pelvis was performed
using the standard protocol following bolus administration of
intravenous contrast.
CONTRAST:  100mL GZK4LB-I44 IOPAMIDOL (GZK4LB-I44) INJECTION 61%

[Series 2: abd/pel with · axial · 0.77mm/px · z∈[-453,-33]mm · 13 of 100 slices shown, 15 images]
[im 8/100  soft-tissue]
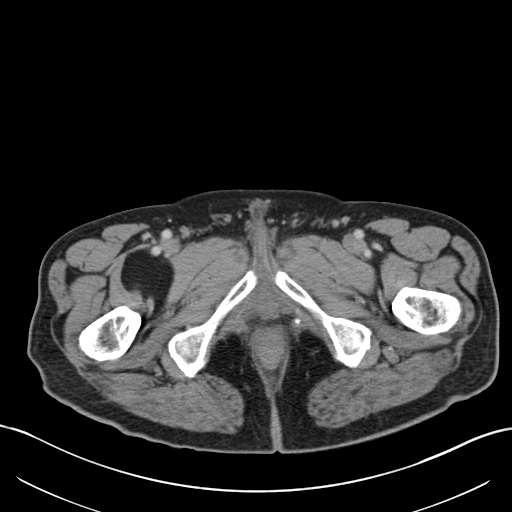
[im 8/100  bone]
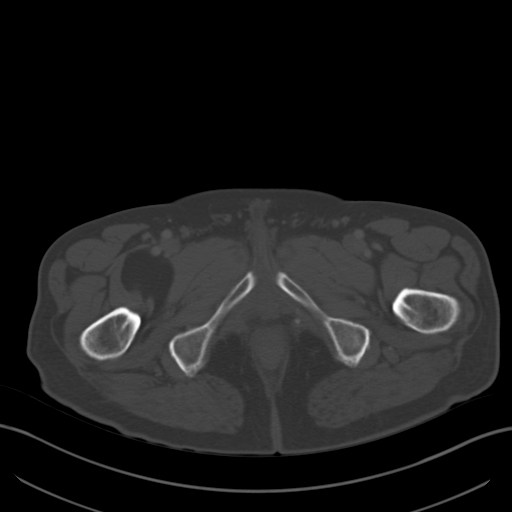
[im 15/100  soft-tissue]
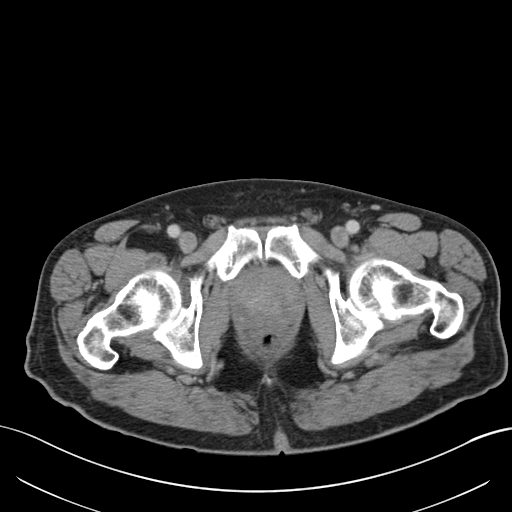
[im 22/100  soft-tissue]
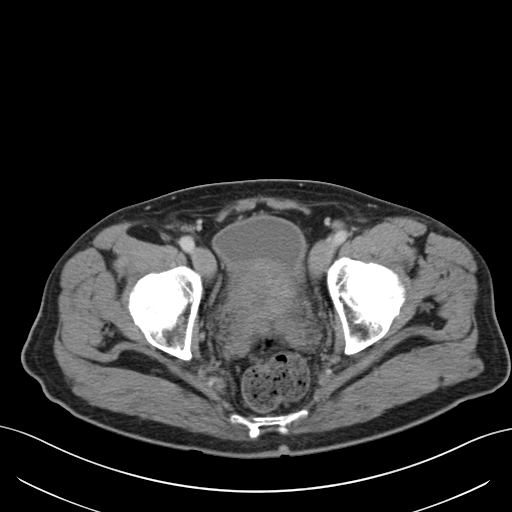
[im 29/100  soft-tissue]
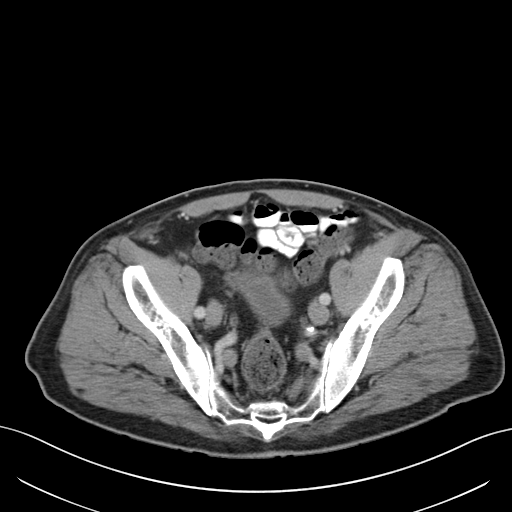
[im 36/100  soft-tissue]
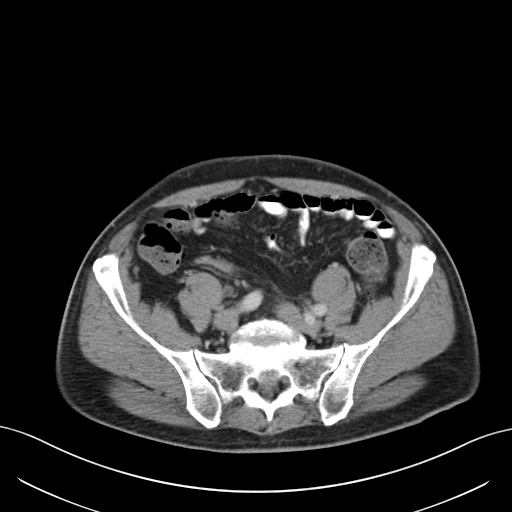
[im 43/100  soft-tissue]
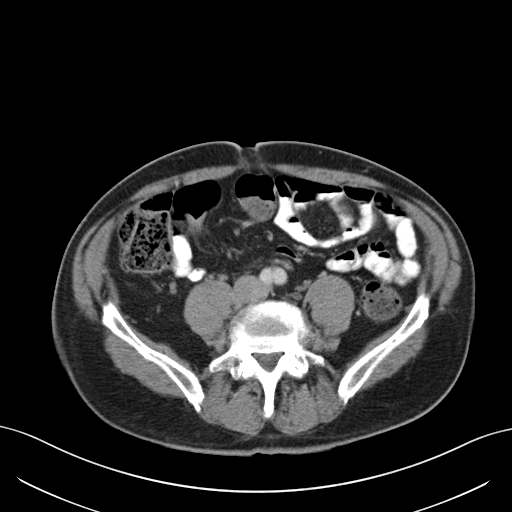
[im 50/100  soft-tissue]
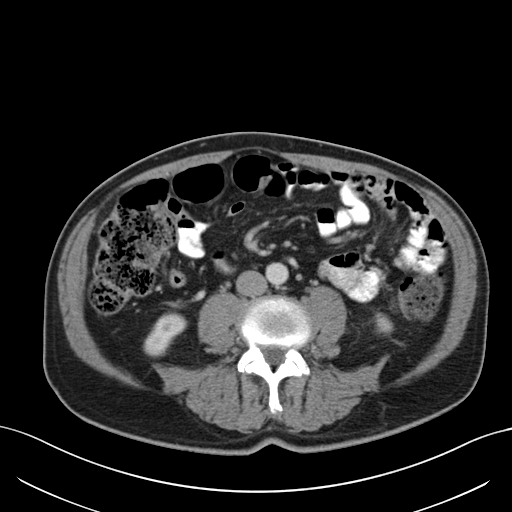
[im 57/100  soft-tissue]
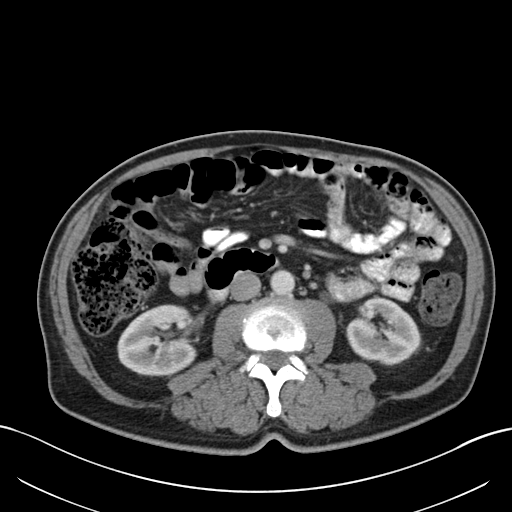
[im 64/100  soft-tissue]
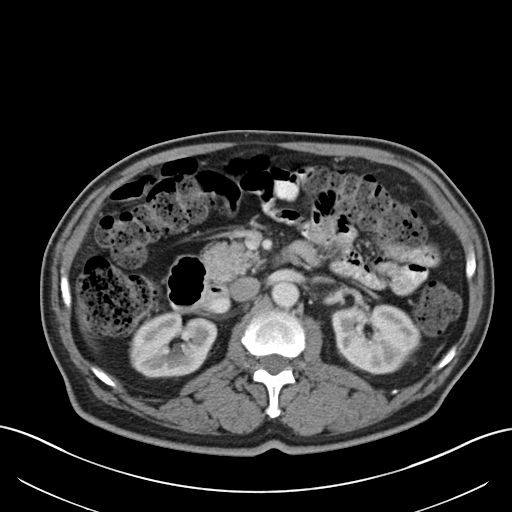
[im 64/100  bone]
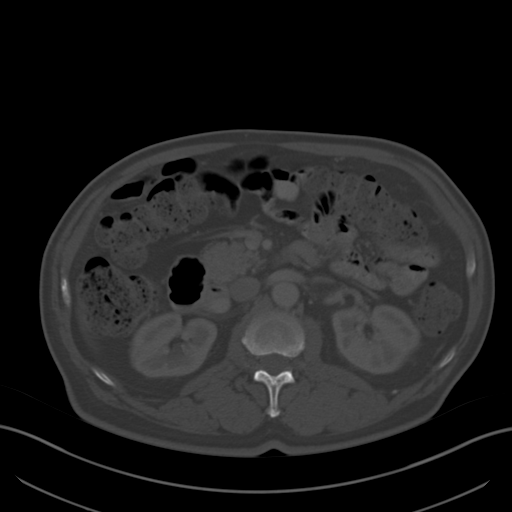
[im 71/100  soft-tissue]
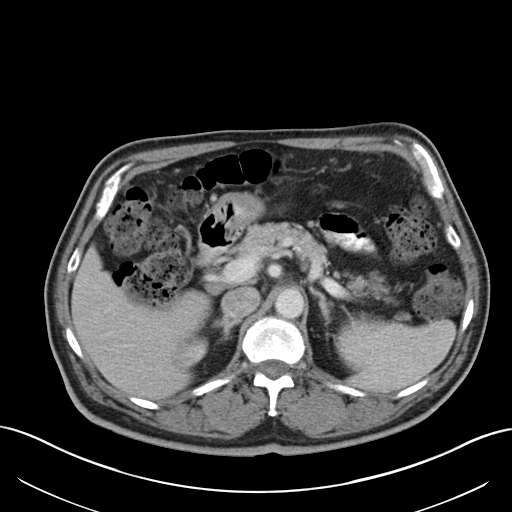
[im 78/100  soft-tissue]
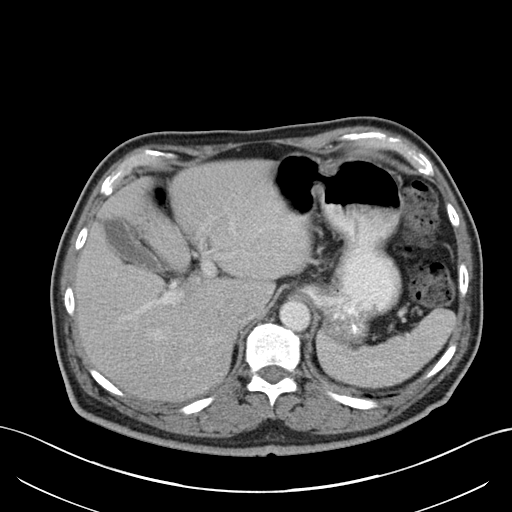
[im 85/100  soft-tissue]
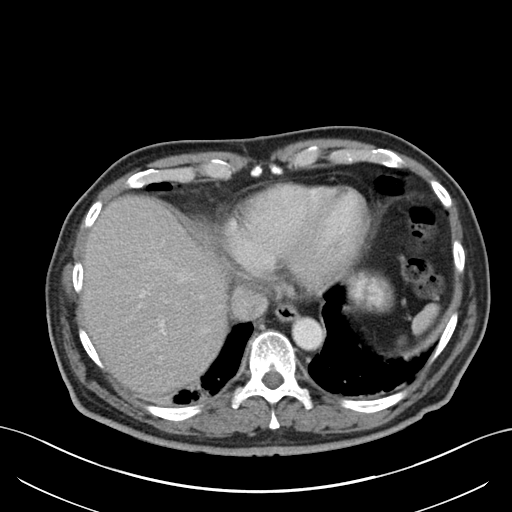
[im 92/100  soft-tissue]
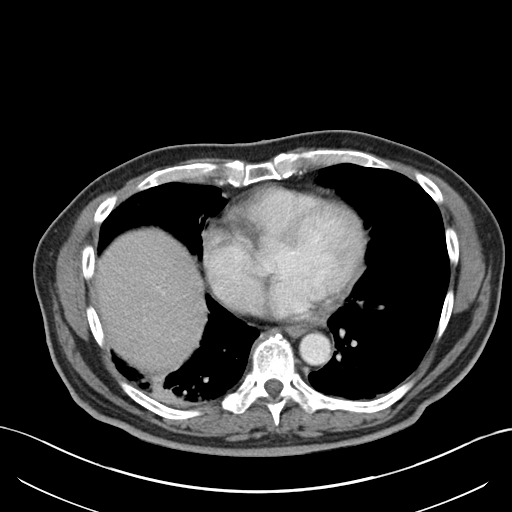

[Series 4: coronal a/|p · coronal · 0.74mm/px · 3 of 154 slices shown]
[im 52/154  soft-tissue]
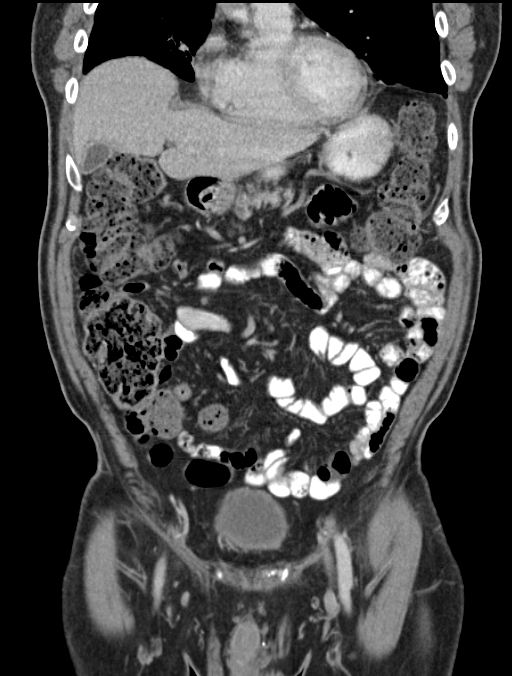
[im 69/154  soft-tissue]
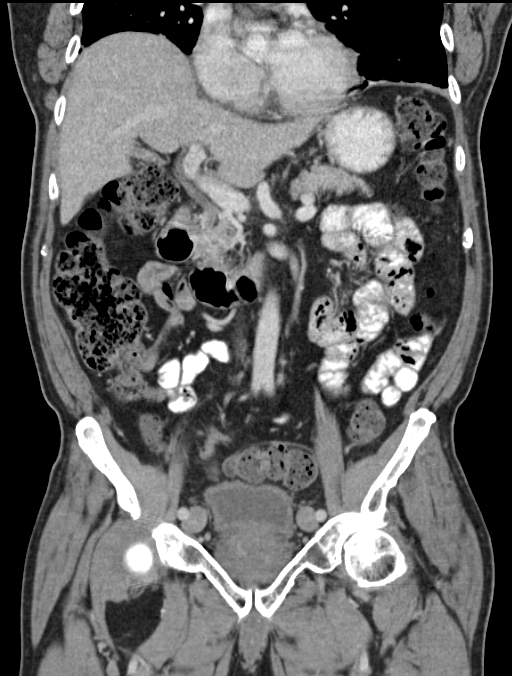
[im 86/154  soft-tissue]
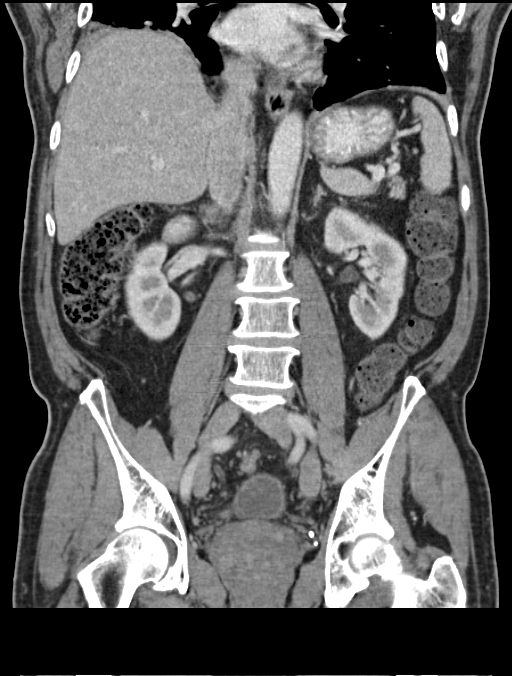

[16 of 46 positions shown; findings below may reference images not displayed]

FINDINGS: There is new opacity in the inferior right middle lobe and
posteriorly in the right lower lobe. The lung bases are otherwise
normal.

No free air or free fluid. The liver, gallbladder, and portal vein
are normal. The spleen, pancreas, and adrenal glands are normal. No
renal stones, masses, or hydronephrosis. No aneurysm or adenopathy.
The stomach and small bowel are normal. There is moderate fecal
loading throughout the colon. The colon is redundant and tortuous
but otherwise normal. No evidence of appendicitis.

There is a fat containing umbilical hernia.

The pelvis demonstrates no adenopathy or mass. The prostate is
enlarged measuring 5.1 x 7.1 cm. The seminal vesicles are normal.
Calcifications in the posterior bladder are likely recently passed
stones.

The visualized bones are unremarkable.
IMPRESSION: 1. Increasing opacity in the right middle and lower lobes. While
this could be atelectasis, infiltrate is not excluded. That being
said, atelectasis is favored. Recommend clinical correlation and
follow-up to resolution.
2. Calcifications dependently in the bladder are likely recently
passed renal stones, not seen previously in the bladder on the last
study.
3. No on going renal stones or obstruction.
4. Prostate enlargement.

## 2017-02-21 IMAGING — CT CT CHEST W/O CM
2 of 4 series · 15 of 36 positions shown, 18 images · non-contrast
Comparison: Chest x-ray and CT of the abdomen and pelvis from
earlier today

CLINICAL DATA: Abnormal chest x-ray.  Evaluate for possible nodule.

EXAM:
CT CHEST WITHOUT CONTRAST
TECHNIQUE: Multidetector CT imaging of the chest was performed following the
standard protocol without IV contrast.

[Series 5: lung windows · axial · 0.70mm/px · z∈[-321,-83]mm · 12 of 139 slices shown, 15 images]
[im 10/139  mediastinal]
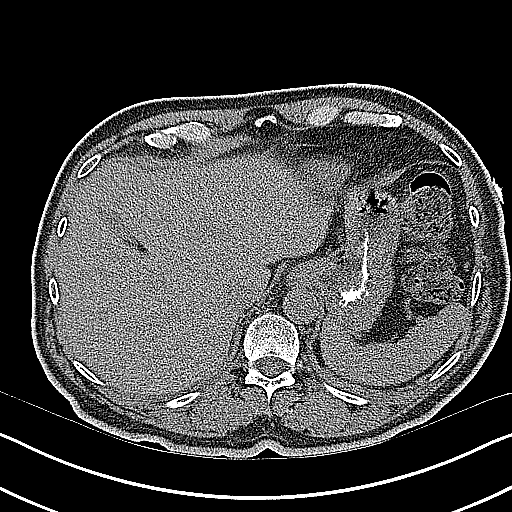
[im 10/139  lung]
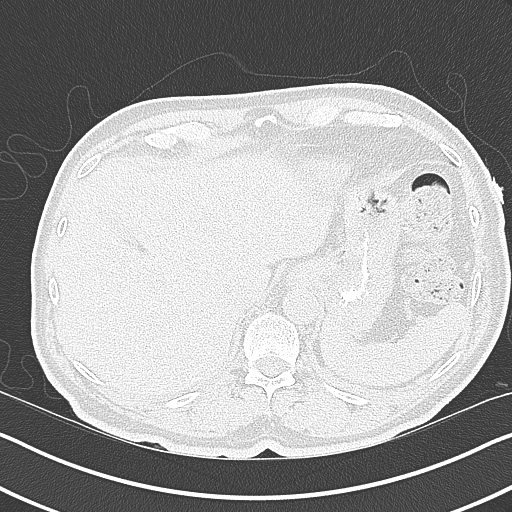
[im 19/139  lung]
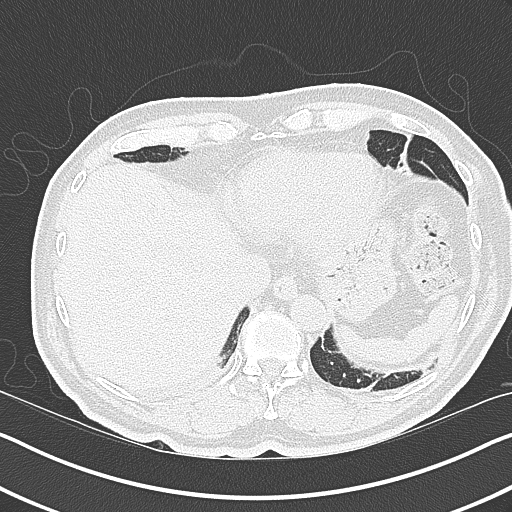
[im 28/139  lung]
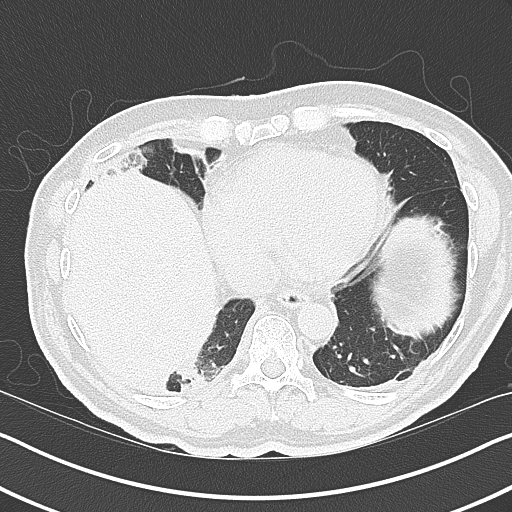
[im 47/139  lung]
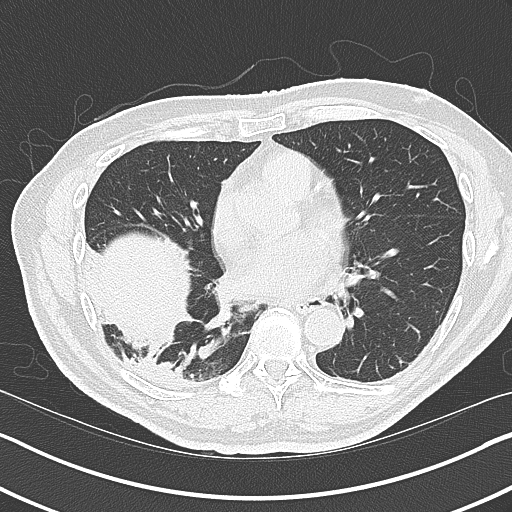
[im 56/139  mediastinal]
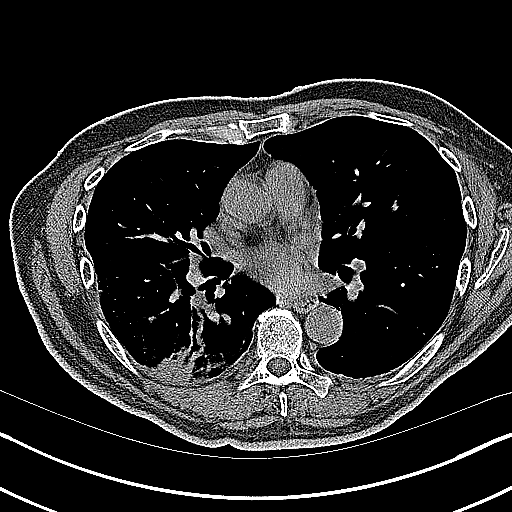
[im 56/139  lung]
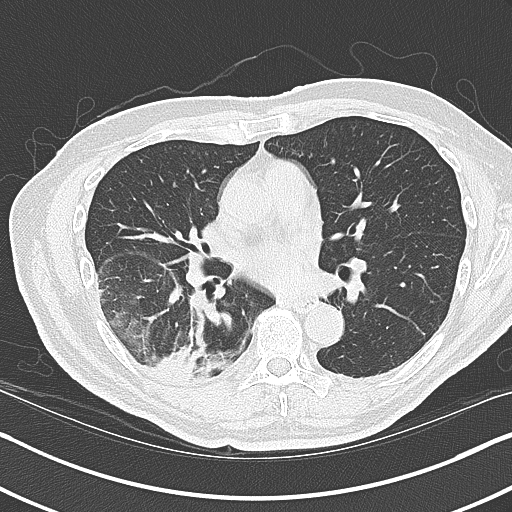
[im 65/139  lung]
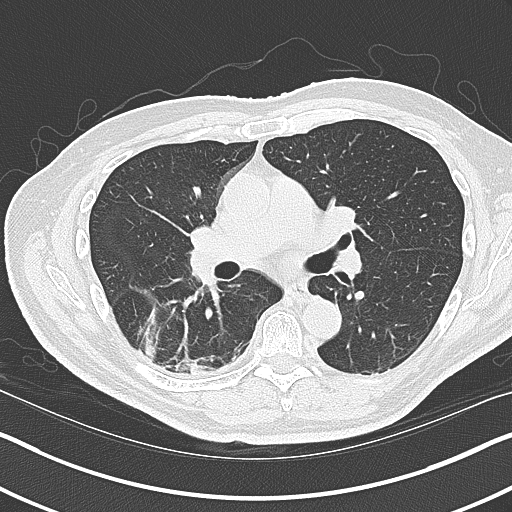
[im 74/139  lung]
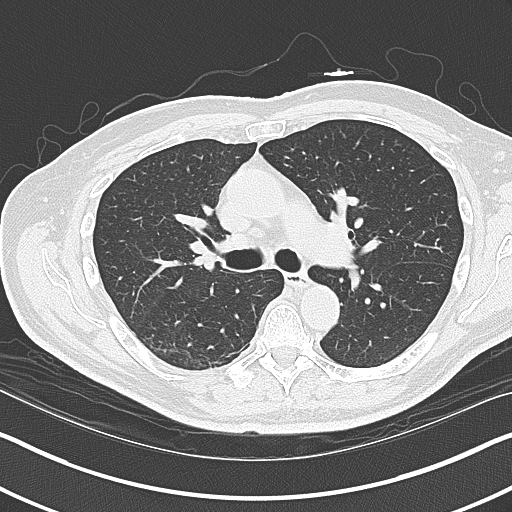
[im 83/139  lung]
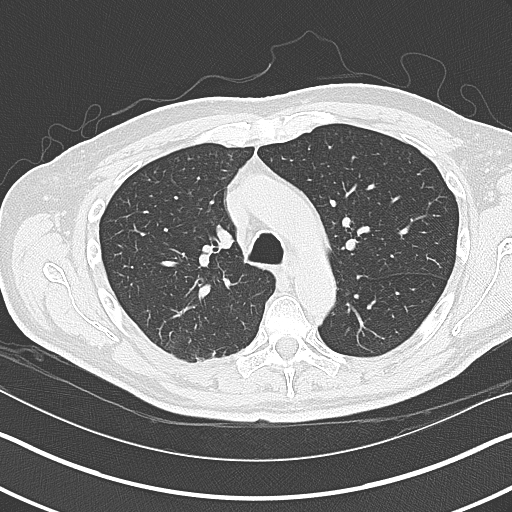
[im 93/139  mediastinal]
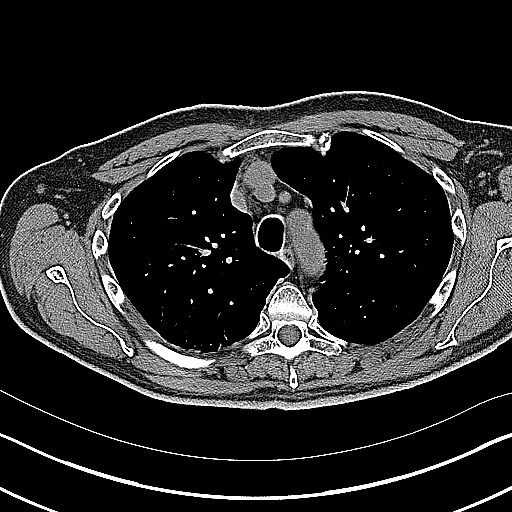
[im 93/139  lung]
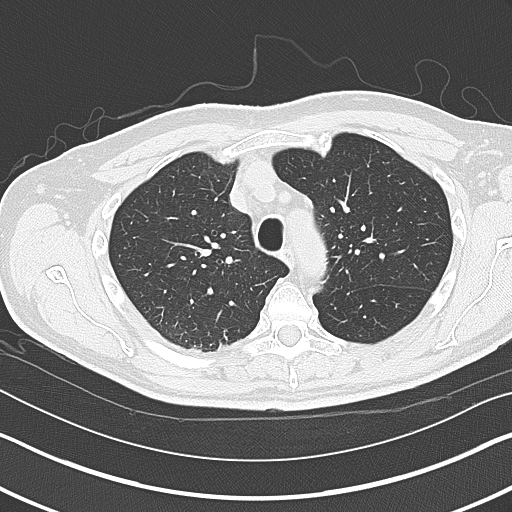
[im 111/139  lung]
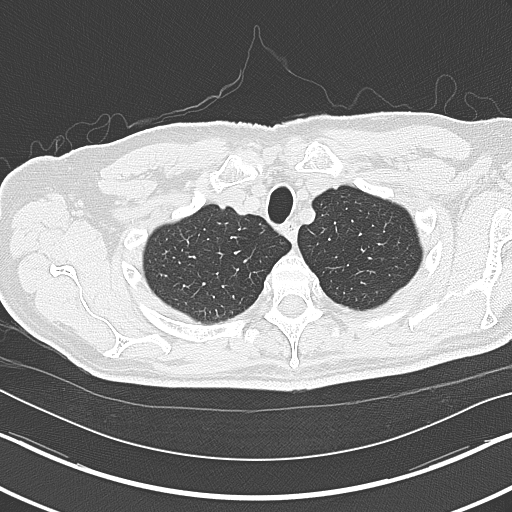
[im 120/139  lung]
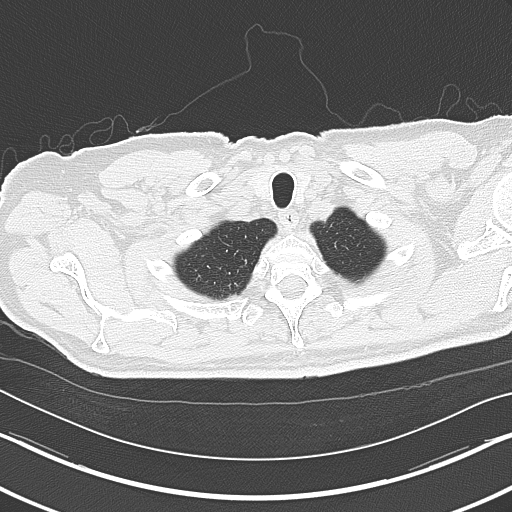
[im 129/139  lung]
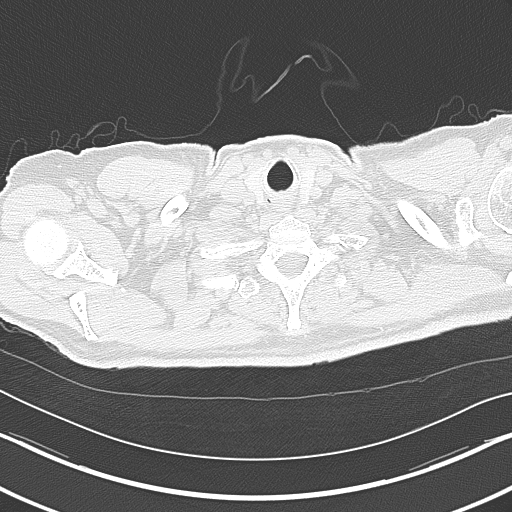

[Series 6: coronals · coronal · 0.59mm/px · 3 of 150 slices shown]
[im 30/150  lung]
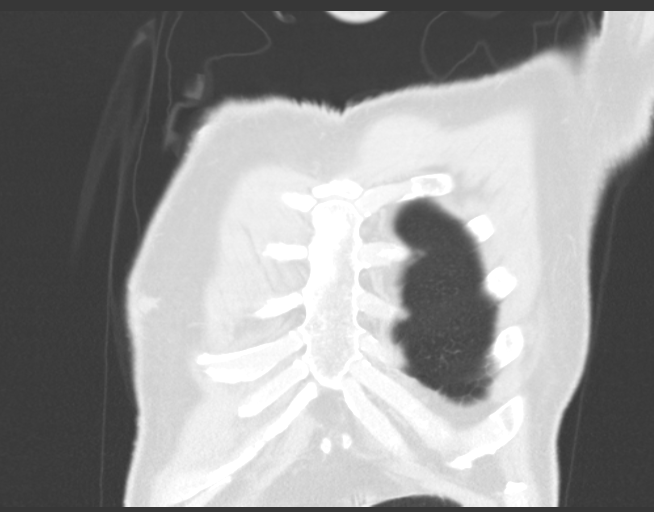
[im 60/150  lung]
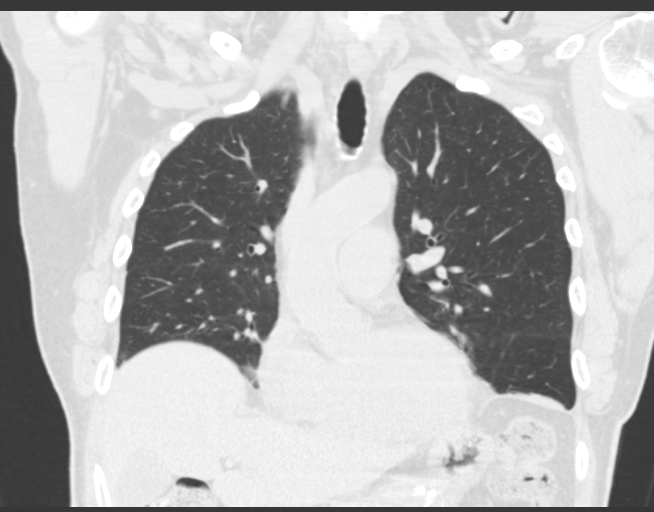
[im 90/150  lung]
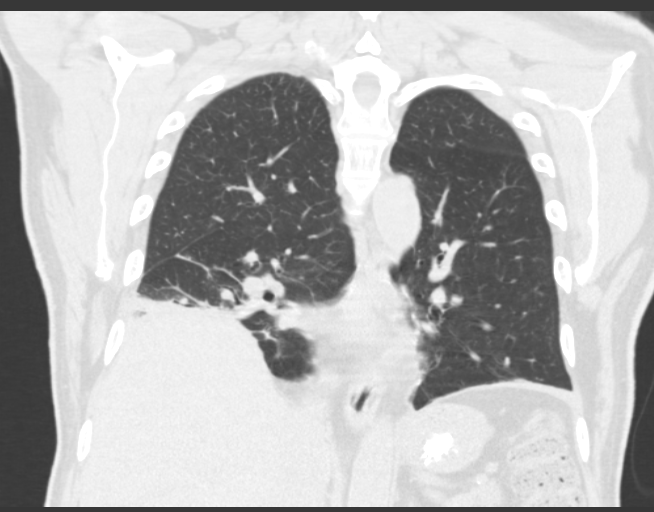

[15 of 36 positions shown; findings below may reference images not displayed]

FINDINGS: The questioned nodular density on the chest x-ray from earlier today
represents asymmetric degenerative change at the right first
costochondral junction. No suspicious pulmonary nodules or masses
are identified. Opacity in the right base is more confluent than
appreciated on the recent CT of the chest with air bronchograms.
These findings are more consistent with infiltrate/pneumonia than
atelectasis. The minimal opacity in the left lung base is consist
with atelectasis. A very mild opacity in the right middle lobe could
represent further infiltrate. No effusions. The heart size is
unchanged. The thoracic aorta and pulmonary arteries are normal in
caliber. No adenopathy.

Evaluation of the upper abdomen is limited but unremarkable.

Visualized bones are within normal limits.
IMPRESSION: 1. Right basilar and middle lobe opacities are worrisome for
infiltrate/pneumonia. Recommend follow-up to resolution.
2. No suspicious pulmonary nodules are identified.

## 2017-03-04 DIAGNOSIS — R339 Retention of urine, unspecified: Secondary | ICD-10-CM | POA: Diagnosis not present

## 2017-04-01 DIAGNOSIS — R339 Retention of urine, unspecified: Secondary | ICD-10-CM | POA: Diagnosis not present

## 2017-04-28 DIAGNOSIS — N281 Cyst of kidney, acquired: Secondary | ICD-10-CM | POA: Diagnosis not present

## 2017-04-28 DIAGNOSIS — N401 Enlarged prostate with lower urinary tract symptoms: Secondary | ICD-10-CM | POA: Diagnosis not present

## 2017-04-28 DIAGNOSIS — R338 Other retention of urine: Secondary | ICD-10-CM | POA: Diagnosis not present

## 2017-04-28 DIAGNOSIS — N312 Flaccid neuropathic bladder, not elsewhere classified: Secondary | ICD-10-CM | POA: Diagnosis not present

## 2017-05-01 DIAGNOSIS — R339 Retention of urine, unspecified: Secondary | ICD-10-CM | POA: Diagnosis not present

## 2017-05-22 DIAGNOSIS — L821 Other seborrheic keratosis: Secondary | ICD-10-CM | POA: Diagnosis not present

## 2017-05-22 DIAGNOSIS — C44729 Squamous cell carcinoma of skin of left lower limb, including hip: Secondary | ICD-10-CM | POA: Diagnosis not present

## 2017-05-22 DIAGNOSIS — D492 Neoplasm of unspecified behavior of bone, soft tissue, and skin: Secondary | ICD-10-CM | POA: Diagnosis not present

## 2017-05-22 DIAGNOSIS — D487 Neoplasm of uncertain behavior of other specified sites: Secondary | ICD-10-CM | POA: Diagnosis not present

## 2017-05-22 DIAGNOSIS — L570 Actinic keratosis: Secondary | ICD-10-CM | POA: Diagnosis not present

## 2017-05-30 DIAGNOSIS — R339 Retention of urine, unspecified: Secondary | ICD-10-CM | POA: Diagnosis not present

## 2017-06-26 DIAGNOSIS — R339 Retention of urine, unspecified: Secondary | ICD-10-CM | POA: Diagnosis not present

## 2017-07-03 DIAGNOSIS — Z23 Encounter for immunization: Secondary | ICD-10-CM | POA: Diagnosis not present

## 2017-07-24 DIAGNOSIS — R339 Retention of urine, unspecified: Secondary | ICD-10-CM | POA: Diagnosis not present

## 2017-08-20 DIAGNOSIS — R339 Retention of urine, unspecified: Secondary | ICD-10-CM | POA: Diagnosis not present

## 2017-08-26 DIAGNOSIS — H35371 Puckering of macula, right eye: Secondary | ICD-10-CM | POA: Diagnosis not present

## 2017-08-26 DIAGNOSIS — Z961 Presence of intraocular lens: Secondary | ICD-10-CM | POA: Diagnosis not present

## 2017-08-28 DIAGNOSIS — N400 Enlarged prostate without lower urinary tract symptoms: Secondary | ICD-10-CM | POA: Diagnosis not present

## 2017-08-28 DIAGNOSIS — B356 Tinea cruris: Secondary | ICD-10-CM | POA: Diagnosis not present

## 2017-08-28 DIAGNOSIS — Z0001 Encounter for general adult medical examination with abnormal findings: Secondary | ICD-10-CM | POA: Diagnosis not present

## 2017-08-28 DIAGNOSIS — E7800 Pure hypercholesterolemia, unspecified: Secondary | ICD-10-CM | POA: Diagnosis not present

## 2017-09-19 DIAGNOSIS — R339 Retention of urine, unspecified: Secondary | ICD-10-CM | POA: Diagnosis not present

## 2017-10-01 DIAGNOSIS — H35371 Puckering of macula, right eye: Secondary | ICD-10-CM | POA: Diagnosis not present

## 2017-10-09 DIAGNOSIS — Z961 Presence of intraocular lens: Secondary | ICD-10-CM | POA: Diagnosis not present

## 2017-10-09 DIAGNOSIS — Z09 Encounter for follow-up examination after completed treatment for conditions other than malignant neoplasm: Secondary | ICD-10-CM | POA: Diagnosis not present

## 2017-10-09 DIAGNOSIS — H35371 Puckering of macula, right eye: Secondary | ICD-10-CM | POA: Diagnosis not present

## 2017-10-20 DIAGNOSIS — R339 Retention of urine, unspecified: Secondary | ICD-10-CM | POA: Diagnosis not present

## 2017-10-27 DIAGNOSIS — R338 Other retention of urine: Secondary | ICD-10-CM | POA: Diagnosis not present

## 2017-10-27 DIAGNOSIS — N3020 Other chronic cystitis without hematuria: Secondary | ICD-10-CM | POA: Diagnosis not present

## 2017-10-27 DIAGNOSIS — N200 Calculus of kidney: Secondary | ICD-10-CM | POA: Diagnosis not present

## 2017-10-27 DIAGNOSIS — N281 Cyst of kidney, acquired: Secondary | ICD-10-CM | POA: Diagnosis not present

## 2017-10-27 DIAGNOSIS — N401 Enlarged prostate with lower urinary tract symptoms: Secondary | ICD-10-CM | POA: Diagnosis not present

## 2017-11-13 DIAGNOSIS — H35371 Puckering of macula, right eye: Secondary | ICD-10-CM | POA: Diagnosis not present

## 2017-11-13 DIAGNOSIS — Z09 Encounter for follow-up examination after completed treatment for conditions other than malignant neoplasm: Secondary | ICD-10-CM | POA: Diagnosis not present

## 2017-11-17 DIAGNOSIS — R339 Retention of urine, unspecified: Secondary | ICD-10-CM | POA: Diagnosis not present

## 2017-12-16 DIAGNOSIS — R339 Retention of urine, unspecified: Secondary | ICD-10-CM | POA: Diagnosis not present

## 2018-01-15 DIAGNOSIS — R339 Retention of urine, unspecified: Secondary | ICD-10-CM | POA: Diagnosis not present

## 2018-02-05 DIAGNOSIS — F411 Generalized anxiety disorder: Secondary | ICD-10-CM | POA: Diagnosis not present

## 2018-02-05 DIAGNOSIS — J309 Allergic rhinitis, unspecified: Secondary | ICD-10-CM | POA: Diagnosis not present

## 2018-02-17 DIAGNOSIS — R339 Retention of urine, unspecified: Secondary | ICD-10-CM | POA: Diagnosis not present

## 2018-02-26 DIAGNOSIS — L570 Actinic keratosis: Secondary | ICD-10-CM | POA: Diagnosis not present

## 2018-02-26 DIAGNOSIS — C44722 Squamous cell carcinoma of skin of right lower limb, including hip: Secondary | ICD-10-CM | POA: Diagnosis not present

## 2018-02-26 DIAGNOSIS — C44729 Squamous cell carcinoma of skin of left lower limb, including hip: Secondary | ICD-10-CM | POA: Diagnosis not present

## 2018-02-26 DIAGNOSIS — L820 Inflamed seborrheic keratosis: Secondary | ICD-10-CM | POA: Diagnosis not present

## 2018-02-26 DIAGNOSIS — D485 Neoplasm of uncertain behavior of skin: Secondary | ICD-10-CM | POA: Diagnosis not present

## 2018-03-18 DIAGNOSIS — R339 Retention of urine, unspecified: Secondary | ICD-10-CM | POA: Diagnosis not present

## 2018-04-01 DIAGNOSIS — C44729 Squamous cell carcinoma of skin of left lower limb, including hip: Secondary | ICD-10-CM | POA: Diagnosis not present

## 2018-04-01 DIAGNOSIS — L570 Actinic keratosis: Secondary | ICD-10-CM | POA: Diagnosis not present

## 2018-04-15 DIAGNOSIS — R339 Retention of urine, unspecified: Secondary | ICD-10-CM | POA: Diagnosis not present

## 2018-04-23 DIAGNOSIS — C44729 Squamous cell carcinoma of skin of left lower limb, including hip: Secondary | ICD-10-CM | POA: Diagnosis not present

## 2018-04-23 DIAGNOSIS — S81802A Unspecified open wound, left lower leg, initial encounter: Secondary | ICD-10-CM | POA: Diagnosis not present

## 2018-04-30 DIAGNOSIS — S81802A Unspecified open wound, left lower leg, initial encounter: Secondary | ICD-10-CM | POA: Diagnosis not present

## 2018-04-30 DIAGNOSIS — Z4801 Encounter for change or removal of surgical wound dressing: Secondary | ICD-10-CM | POA: Diagnosis not present

## 2018-05-07 DIAGNOSIS — Z4801 Encounter for change or removal of surgical wound dressing: Secondary | ICD-10-CM | POA: Diagnosis not present

## 2018-05-07 DIAGNOSIS — S81802A Unspecified open wound, left lower leg, initial encounter: Secondary | ICD-10-CM | POA: Diagnosis not present

## 2018-05-14 DIAGNOSIS — C44722 Squamous cell carcinoma of skin of right lower limb, including hip: Secondary | ICD-10-CM | POA: Diagnosis not present

## 2018-05-14 DIAGNOSIS — Z4801 Encounter for change or removal of surgical wound dressing: Secondary | ICD-10-CM | POA: Diagnosis not present

## 2018-05-14 DIAGNOSIS — S81802A Unspecified open wound, left lower leg, initial encounter: Secondary | ICD-10-CM | POA: Diagnosis not present

## 2018-05-15 DIAGNOSIS — R339 Retention of urine, unspecified: Secondary | ICD-10-CM | POA: Diagnosis not present

## 2018-05-21 DIAGNOSIS — Z4801 Encounter for change or removal of surgical wound dressing: Secondary | ICD-10-CM | POA: Diagnosis not present

## 2018-05-21 DIAGNOSIS — S81802A Unspecified open wound, left lower leg, initial encounter: Secondary | ICD-10-CM | POA: Diagnosis not present

## 2018-06-01 DIAGNOSIS — N281 Cyst of kidney, acquired: Secondary | ICD-10-CM | POA: Diagnosis not present

## 2018-06-01 DIAGNOSIS — R339 Retention of urine, unspecified: Secondary | ICD-10-CM | POA: Diagnosis not present

## 2018-06-01 DIAGNOSIS — N312 Flaccid neuropathic bladder, not elsewhere classified: Secondary | ICD-10-CM | POA: Diagnosis not present

## 2018-06-01 DIAGNOSIS — N200 Calculus of kidney: Secondary | ICD-10-CM | POA: Diagnosis not present

## 2018-06-12 DIAGNOSIS — R61 Generalized hyperhidrosis: Secondary | ICD-10-CM | POA: Diagnosis not present

## 2018-06-12 DIAGNOSIS — N3000 Acute cystitis without hematuria: Secondary | ICD-10-CM | POA: Diagnosis not present

## 2018-06-12 DIAGNOSIS — Z23 Encounter for immunization: Secondary | ICD-10-CM | POA: Diagnosis not present

## 2018-06-12 DIAGNOSIS — R339 Retention of urine, unspecified: Secondary | ICD-10-CM | POA: Diagnosis not present

## 2018-06-26 DIAGNOSIS — R8290 Unspecified abnormal findings in urine: Secondary | ICD-10-CM | POA: Diagnosis not present

## 2018-07-13 DIAGNOSIS — R339 Retention of urine, unspecified: Secondary | ICD-10-CM | POA: Diagnosis not present

## 2018-07-14 DIAGNOSIS — L820 Inflamed seborrheic keratosis: Secondary | ICD-10-CM | POA: Diagnosis not present

## 2018-07-14 DIAGNOSIS — C44729 Squamous cell carcinoma of skin of left lower limb, including hip: Secondary | ICD-10-CM | POA: Diagnosis not present

## 2018-07-14 DIAGNOSIS — D485 Neoplasm of uncertain behavior of skin: Secondary | ICD-10-CM | POA: Diagnosis not present

## 2018-08-11 DIAGNOSIS — R339 Retention of urine, unspecified: Secondary | ICD-10-CM | POA: Diagnosis not present

## 2018-08-20 DIAGNOSIS — R0789 Other chest pain: Secondary | ICD-10-CM | POA: Diagnosis not present

## 2018-08-20 DIAGNOSIS — R0602 Shortness of breath: Secondary | ICD-10-CM | POA: Diagnosis not present

## 2018-08-22 ENCOUNTER — Encounter (HOSPITAL_COMMUNITY): Payer: Self-pay | Admitting: Emergency Medicine

## 2018-08-22 ENCOUNTER — Inpatient Hospital Stay (HOSPITAL_COMMUNITY)
Admission: EM | Admit: 2018-08-22 | Discharge: 2018-08-24 | DRG: 247 | Disposition: A | Discharge status: Home / Self Care | Payer: Medicare Other | Attending: Cardiovascular Disease | Admitting: Cardiovascular Disease

## 2018-08-22 ENCOUNTER — Inpatient Hospital Stay (HOSPITAL_COMMUNITY)
Admission: EM | Disposition: A | Discharge status: Home / Self Care | Payer: Self-pay | Source: Home / Self Care | Attending: Cardiovascular Disease

## 2018-08-22 ENCOUNTER — Emergency Department (HOSPITAL_COMMUNITY): Payer: Medicare Other

## 2018-08-22 DIAGNOSIS — Z8041 Family history of malignant neoplasm of ovary: Secondary | ICD-10-CM | POA: Diagnosis not present

## 2018-08-22 DIAGNOSIS — E782 Mixed hyperlipidemia: Secondary | ICD-10-CM | POA: Diagnosis present

## 2018-08-22 DIAGNOSIS — I213 ST elevation (STEMI) myocardial infarction of unspecified site: Secondary | ICD-10-CM

## 2018-08-22 DIAGNOSIS — I2510 Atherosclerotic heart disease of native coronary artery without angina pectoris: Secondary | ICD-10-CM | POA: Diagnosis not present

## 2018-08-22 DIAGNOSIS — Z888 Allergy status to other drugs, medicaments and biological substances status: Secondary | ICD-10-CM

## 2018-08-22 DIAGNOSIS — N400 Enlarged prostate without lower urinary tract symptoms: Secondary | ICD-10-CM | POA: Diagnosis present

## 2018-08-22 DIAGNOSIS — I255 Ischemic cardiomyopathy: Secondary | ICD-10-CM | POA: Diagnosis not present

## 2018-08-22 DIAGNOSIS — Z85828 Personal history of other malignant neoplasm of skin: Secondary | ICD-10-CM | POA: Diagnosis not present

## 2018-08-22 DIAGNOSIS — Z955 Presence of coronary angioplasty implant and graft: Secondary | ICD-10-CM

## 2018-08-22 DIAGNOSIS — R079 Chest pain, unspecified: Secondary | ICD-10-CM | POA: Diagnosis not present

## 2018-08-22 DIAGNOSIS — Z79899 Other long term (current) drug therapy: Secondary | ICD-10-CM

## 2018-08-22 DIAGNOSIS — Z87442 Personal history of urinary calculi: Secondary | ICD-10-CM

## 2018-08-22 DIAGNOSIS — I2102 ST elevation (STEMI) myocardial infarction involving left anterior descending coronary artery: Principal | ICD-10-CM

## 2018-08-22 DIAGNOSIS — I252 Old myocardial infarction: Secondary | ICD-10-CM | POA: Diagnosis present

## 2018-08-22 DIAGNOSIS — Z882 Allergy status to sulfonamides status: Secondary | ICD-10-CM | POA: Diagnosis not present

## 2018-08-22 HISTORY — PX: LEFT HEART CATH AND CORONARY ANGIOGRAPHY: CATH118249

## 2018-08-22 HISTORY — PX: CORONARY/GRAFT ACUTE MI REVASCULARIZATION: CATH118305

## 2018-08-22 HISTORY — DX: Atherosclerotic heart disease of native coronary artery without angina pectoris: I25.10

## 2018-08-22 LAB — BASIC METABOLIC PANEL
Anion gap: 13 (ref 5–15)
BUN: 22 mg/dL (ref 8–23)
CO2: 22 mmol/L (ref 22–32)
Calcium: 9.4 mg/dL (ref 8.9–10.3)
Chloride: 104 mmol/L (ref 98–111)
Creatinine, Ser: 1.02 mg/dL (ref 0.61–1.24)
GFR calc Af Amer: >60 mL/min (ref >60)
Glucose, Bld: 125 mg/dL — ABNORMAL HIGH (ref 70–99)
Potassium: 3.6 mmol/L (ref 3.5–5.1)
SODIUM: 139 mmol/L (ref 135–145)

## 2018-08-22 LAB — I-STAT TROPONIN, ED: TROPONIN I, POC: 0.17 ng/mL — AB (ref 0.00–0.08)

## 2018-08-22 LAB — PROTIME-INR
INR: 0.99
Prothrombin Time: 13 seconds (ref 11.4–15.2)

## 2018-08-22 LAB — POCT ACTIVATED CLOTTING TIME: Activated Clotting Time: 235 seconds

## 2018-08-22 LAB — HEMOGLOBIN A1C
Hgb A1c MFr Bld: 5.4 % (ref 4.8–5.6)
Mean Plasma Glucose: 108.28 mg/dL

## 2018-08-22 LAB — TROPONIN I
Troponin I: 0.35 ng/mL (ref <0.03)
Troponin I: 28.81 ng/mL (ref <0.03)
Troponin I: >65 ng/mL (ref <0.03)

## 2018-08-22 LAB — CBC
HCT: 45.3 % (ref 39.0–52.0)
Hemoglobin: 15.1 g/dL (ref 13.0–17.0)
MCH: 30.5 pg (ref 26.0–34.0)
MCHC: 33.3 g/dL (ref 30.0–36.0)
MCV: 91.5 fL (ref 80.0–100.0)
Platelets: 159 10*3/uL (ref 150–400)
RBC: 4.95 MIL/uL (ref 4.22–5.81)
RDW: 12.7 % (ref 11.5–15.5)
WBC: 8.6 10*3/uL (ref 4.0–10.5)
nRBC: 0 % (ref 0.0–0.2)

## 2018-08-22 LAB — LIPID PANEL
CHOL/HDL RATIO: 3.6 ratio
Cholesterol: 156 mg/dL (ref 0–200)
HDL: 43 mg/dL (ref >40)
LDL Cholesterol: 93 mg/dL (ref 0–99)
Triglycerides: 101 mg/dL (ref <150)
VLDL: 20 mg/dL (ref 0–40)

## 2018-08-22 LAB — APTT: aPTT: 29 seconds (ref 24–36)

## 2018-08-22 LAB — BRAIN NATRIURETIC PEPTIDE: B Natriuretic Peptide: 186.4 pg/mL — ABNORMAL HIGH (ref 0.0–100.0)

## 2018-08-22 LAB — MRSA PCR SCREENING: MRSA BY PCR: NEGATIVE

## 2018-08-22 SURGERY — CORONARY/GRAFT ACUTE MI REVASCULARIZATION
Anesthesia: LOCAL

## 2018-08-22 MED ORDER — SODIUM CHLORIDE 0.9 % IV SOLN
INTRAVENOUS | Status: DC
  Administered 2018-08-22: 12:00:00 via INTRAVENOUS

## 2018-08-22 MED ORDER — LIDOCAINE HCL (PF) 1 % IJ SOLN
INTRAMUSCULAR | Status: AC
  Filled 2018-08-22: qty 30

## 2018-08-22 MED ORDER — SODIUM CHLORIDE 0.9 % IV SOLN
INTRAVENOUS | Status: AC
  Administered 2018-08-22: 17:00:00 via INTRAVENOUS

## 2018-08-22 MED ORDER — SODIUM CHLORIDE 0.9 % IV SOLN: 250.0000 mL | INTRAVENOUS | Status: DC | PRN

## 2018-08-22 MED ORDER — TICAGRELOR 90 MG PO TABS
180.0000 mg | ORAL_TABLET | Freq: Once | ORAL | Status: AC
  Administered 2018-08-22: 180 mg via ORAL
  Filled 2018-08-22: qty 2

## 2018-08-22 MED ORDER — HYDRALAZINE HCL 20 MG/ML IJ SOLN: 5.0000 mg | INTRAMUSCULAR | Status: AC | PRN

## 2018-08-22 MED ORDER — TIROFIBAN HCL IN NACL 5-0.9 MG/100ML-% IV SOLN
INTRAVENOUS | Status: AC | PRN
  Administered 2018-08-22: 0.15 ug/kg/min via INTRAVENOUS

## 2018-08-22 MED ORDER — HEPARIN SODIUM (PORCINE) 5000 UNIT/ML IJ SOLN
5000.0000 [IU] | Freq: Three times a day (TID) | INTRAMUSCULAR | Status: DC
  Administered 2018-08-23 – 2018-08-24 (×4): 5000 [IU] via SUBCUTANEOUS
  Filled 2018-08-22 (×4): qty 1

## 2018-08-22 MED ORDER — NITROGLYCERIN 1 MG/10 ML FOR IR/CATH LAB
INTRA_ARTERIAL | Status: DC | PRN
  Administered 2018-08-22: 150 ug via INTRACORONARY
  Administered 2018-08-22: 200 ug via INTRACORONARY

## 2018-08-22 MED ORDER — FENTANYL CITRATE (PF) 100 MCG/2ML IJ SOLN
INTRAMUSCULAR | Status: AC
  Filled 2018-08-22: qty 2

## 2018-08-22 MED ORDER — ACETAMINOPHEN 325 MG PO TABS: 650.0000 mg | ORAL_TABLET | ORAL | Status: DC | PRN

## 2018-08-22 MED ORDER — ONDANSETRON HCL 4 MG/2ML IJ SOLN
INTRAMUSCULAR | Status: AC
  Filled 2018-08-22: qty 2

## 2018-08-22 MED ORDER — NITROGLYCERIN 0.4 MG SL SUBL: 0.4000 mg | SUBLINGUAL_TABLET | SUBLINGUAL | Status: DC | PRN

## 2018-08-22 MED ORDER — TIROFIBAN HCL IN NACL 5-0.9 MG/100ML-% IV SOLN
0.1500 ug/kg/min | INTRAVENOUS | Status: AC
  Filled 2018-08-22: qty 100

## 2018-08-22 MED ORDER — VERAPAMIL HCL 2.5 MG/ML IV SOLN
INTRAVENOUS | Status: DC | PRN
  Administered 2018-08-22 (×2): 200 ug via INTRACORONARY

## 2018-08-22 MED ORDER — HEPARIN (PORCINE) IN NACL 1000-0.9 UT/500ML-% IV SOLN
INTRAVENOUS | Status: DC | PRN
  Administered 2018-08-22 (×2): 500 mL

## 2018-08-22 MED ORDER — MIDAZOLAM HCL 2 MG/2ML IJ SOLN
INTRAMUSCULAR | Status: AC
  Filled 2018-08-22: qty 2

## 2018-08-22 MED ORDER — ASPIRIN 81 MG PO CHEW
324.0000 mg | CHEWABLE_TABLET | Freq: Once | ORAL | Status: AC
  Administered 2018-08-22: 324 mg via ORAL
  Filled 2018-08-22: qty 4

## 2018-08-22 MED ORDER — HEPARIN (PORCINE) IN NACL 1000-0.9 UT/500ML-% IV SOLN
INTRAVENOUS | Status: AC
  Filled 2018-08-22: qty 1000

## 2018-08-22 MED ORDER — VERAPAMIL HCL 2.5 MG/ML IV SOLN
INTRAVENOUS | Status: DC | PRN
  Administered 2018-08-22: 10 mL via INTRA_ARTERIAL

## 2018-08-22 MED ORDER — LABETALOL HCL 5 MG/ML IV SOLN: 10.0000 mg | INTRAVENOUS | Status: AC | PRN

## 2018-08-22 MED ORDER — SODIUM CHLORIDE 0.9% FLUSH: 3.0000 mL | INTRAVENOUS | Status: DC | PRN

## 2018-08-22 MED ORDER — HEPARIN SODIUM (PORCINE) 5000 UNIT/ML IJ SOLN: 60.0000 [IU]/kg | Freq: Once | INTRAMUSCULAR | Status: DC

## 2018-08-22 MED ORDER — ASPIRIN EC 81 MG PO TBEC
81.0000 mg | DELAYED_RELEASE_TABLET | Freq: Every day | ORAL | Status: DC
  Administered 2018-08-23 – 2018-08-24 (×2): 81 mg via ORAL
  Filled 2018-08-22 (×2): qty 1

## 2018-08-22 MED ORDER — MIDAZOLAM HCL 2 MG/2ML IJ SOLN
INTRAMUSCULAR | Status: DC | PRN
  Administered 2018-08-22: 2 mg via INTRAVENOUS

## 2018-08-22 MED ORDER — TIROFIBAN HCL IN NACL 5-0.9 MG/100ML-% IV SOLN
INTRAVENOUS | Status: AC
  Filled 2018-08-22: qty 100

## 2018-08-22 MED ORDER — TICAGRELOR 90 MG PO TABS
90.0000 mg | ORAL_TABLET | Freq: Two times a day (BID) | ORAL | Status: DC
  Administered 2018-08-23 – 2018-08-24 (×3): 90 mg via ORAL
  Filled 2018-08-22 (×3): qty 1

## 2018-08-22 MED ORDER — IOHEXOL 350 MG/ML SOLN
INTRAVENOUS | Status: DC | PRN
  Administered 2018-08-22: 165 mg via INTRA_ARTERIAL

## 2018-08-22 MED ORDER — NITROGLYCERIN 1 MG/10 ML FOR IR/CATH LAB
INTRA_ARTERIAL | Status: AC
  Filled 2018-08-22: qty 10

## 2018-08-22 MED ORDER — SODIUM CHLORIDE 0.9% FLUSH
3.0000 mL | Freq: Two times a day (BID) | INTRAVENOUS | Status: DC
  Administered 2018-08-22 – 2018-08-23 (×3): 3 mL via INTRAVENOUS

## 2018-08-22 MED ORDER — HEPARIN SODIUM (PORCINE) 5000 UNIT/ML IJ SOLN
4000.0000 [IU] | Freq: Once | INTRAMUSCULAR | Status: AC
  Administered 2018-08-22: 4000 [IU] via INTRAVENOUS

## 2018-08-22 MED ORDER — MORPHINE SULFATE (PF) 2 MG/ML IV SOLN: 2.0000 mg | INTRAVENOUS | Status: DC | PRN

## 2018-08-22 MED ORDER — HEPARIN SODIUM (PORCINE) 1000 UNIT/ML IJ SOLN
INTRAMUSCULAR | Status: AC
  Filled 2018-08-22: qty 1

## 2018-08-22 MED ORDER — VERAPAMIL HCL 2.5 MG/ML IV SOLN
INTRAVENOUS | Status: AC
  Filled 2018-08-22: qty 2

## 2018-08-22 MED ORDER — HEPARIN SODIUM (PORCINE) 1000 UNIT/ML IJ SOLN
INTRAMUSCULAR | Status: DC | PRN
  Administered 2018-08-22: 5000 [IU] via INTRAVENOUS
  Administered 2018-08-22: 2000 [IU] via INTRAVENOUS

## 2018-08-22 MED ORDER — ATORVASTATIN CALCIUM 80 MG PO TABS
80.0000 mg | ORAL_TABLET | Freq: Every day | ORAL | Status: DC
  Administered 2018-08-22 – 2018-08-23 (×2): 80 mg via ORAL
  Filled 2018-08-22 (×2): qty 1

## 2018-08-22 MED ORDER — TIROFIBAN (AGGRASTAT) BOLUS VIA INFUSION
INTRAVENOUS | Status: DC | PRN
  Administered 2018-08-22: 2110 ug via INTRAVENOUS

## 2018-08-22 MED ORDER — ONDANSETRON HCL 4 MG/2ML IJ SOLN
INTRAMUSCULAR | Status: DC | PRN
  Administered 2018-08-22: 4 mg via INTRAVENOUS

## 2018-08-22 MED ORDER — OXYCODONE HCL 5 MG PO TABS: 5.0000 mg | ORAL_TABLET | ORAL | Status: DC | PRN

## 2018-08-22 MED ORDER — PANTOPRAZOLE SODIUM 40 MG PO TBEC
40.0000 mg | DELAYED_RELEASE_TABLET | Freq: Every day | ORAL | Status: DC
  Administered 2018-08-22 – 2018-08-24 (×3): 40 mg via ORAL
  Filled 2018-08-22 (×3): qty 1

## 2018-08-22 MED ORDER — FENTANYL CITRATE (PF) 100 MCG/2ML IJ SOLN
INTRAMUSCULAR | Status: DC | PRN
  Administered 2018-08-22: 25 ug via INTRAVENOUS

## 2018-08-22 MED ORDER — TAMSULOSIN HCL 0.4 MG PO CAPS
0.4000 mg | ORAL_CAPSULE | Freq: Two times a day (BID) | ORAL | Status: DC
  Administered 2018-08-22 – 2018-08-24 (×4): 0.4 mg via ORAL
  Filled 2018-08-22 (×4): qty 1

## 2018-08-22 MED ORDER — TICAGRELOR 90 MG PO TABS
ORAL_TABLET | ORAL | Status: DC | PRN
  Administered 2018-08-22: 180 mg via ORAL

## 2018-08-22 MED ORDER — CARVEDILOL 3.125 MG PO TABS
3.1250 mg | ORAL_TABLET | Freq: Two times a day (BID) | ORAL | Status: DC
  Administered 2018-08-22 – 2018-08-24 (×4): 3.125 mg via ORAL
  Filled 2018-08-22 (×4): qty 1

## 2018-08-22 MED ORDER — ONDANSETRON HCL 4 MG/2ML IJ SOLN: 4.0000 mg | Freq: Four times a day (QID) | INTRAMUSCULAR | Status: DC | PRN

## 2018-08-22 SURGICAL SUPPLY — 18 items
BALLN SAPPHIRE 2.5X12 (BALLOONS) ×2
BALLN ~~LOC~~ EMERGE MR 3.25X15 (BALLOONS) ×2
BALLOON SAPPHIRE 2.5X12 (BALLOONS) ×1 IMPLANT
BALLOON ~~LOC~~ EMERGE MR 3.25X15 (BALLOONS) ×1 IMPLANT
CATH 5FR JL3.5 JR4 ANG PIG MP (CATHETERS) ×2 IMPLANT
CATH LAUNCHER 6FR EBU3.5 (CATHETERS) ×2 IMPLANT
DEVICE RAD COMP TR BAND LRG (VASCULAR PRODUCTS) ×2 IMPLANT
GLIDESHEATH SLEND SS 6F .021 (SHEATH) ×2 IMPLANT
GUIDEWIRE INQWIRE 1.5J.035X260 (WIRE) ×1 IMPLANT
INQWIRE 1.5J .035X260CM (WIRE) ×2
KIT ENCORE 26 ADVANTAGE (KITS) ×2 IMPLANT
KIT HEART LEFT (KITS) ×2 IMPLANT
PACK CARDIAC CATHETERIZATION (CUSTOM PROCEDURE TRAY) ×2 IMPLANT
STENT RESOLUTE ONYX 3.0X18 (Permanent Stent) ×2 IMPLANT
SYR MEDRAD MARK 7 150ML (SYRINGE) ×2 IMPLANT
TRANSDUCER W/STOPCOCK (MISCELLANEOUS) ×2 IMPLANT
TUBING CIL FLEX 10 FLL-RA (TUBING) ×2 IMPLANT
WIRE COUGAR XT STRL 190CM (WIRE) ×2 IMPLANT

## 2018-08-22 NOTE — ED Triage Notes (Signed)
Pt has been having substernal chest pain that started 1 week ago and intermittent pt seen PCP on Thursday had EKG and started on GERD medication. Pt states pain has gotten worse and having what feels like gas in chest with a lot of belching.

## 2018-08-22 NOTE — H&P (Signed)
Cardiology Admission History and Physical:   Patient ID: James [name] MRN: [id number]; DOB: 10-13-1940   Admission date: 08/22/2018  Primary Care Provider: Alroy Dust, L.Marlou Sa, MD Primary Cardiologist: No primary care provider on file.  Primary Electrophysiologist:  None   Chief Complaint: Chest pain  Patient Profile:   James [name] is a 77 y.o. male with history of hyperlipidemia who presents with chest pain and EKG consistent with anteroseptal STEMI  History of Present Illness:   James [name] awakened this morning with chest pain.  He describes substernal central discomfort described as a pressure-like sensation, currently 9/10.  There is associated diaphoresis and shortness of breath.  No nausea or vomiting.  He has had intermittent symptoms of chest discomfort over the past week both with and without exertion.  This morning the pain became more severe and constant.  He and his wife drove to the emergency department and arrived by private vehicle.  Initial EKG was suggestive but not diagnostic of STEMI.  His troponin came back mildly elevated and chest pain symptoms persisted.  A repeat EKG showed more clear anteroseptal ST elevation and a code STEMI is called.   Past Medical History:  Diagnosis Date  . BPH (benign prostatic hypertrophy)   . Hyperlipidemia   . Kidney stone   . Prostatitis   . Skin cancer   . Trigger finger     No past surgical history on file.   Medications Prior to Admission: Prior to Admission medications   Medication Sig Start Date End Date Taking? Authorizing Provider  atorvastatin (LIPITOR) 10 MG tablet Take 10 mg by mouth daily at 6 PM.    Yes [provider]  Multiple Vitamin (MULTIVITAMIN WITH MINERALS) TABS tablet Take 1 tablet by mouth daily.   Yes [provider]  Omega-3 Fatty Acids (FISH OIL) 1000 MG CAPS Take 1,000 mg by mouth at bedtime.    Yes [provider]  omeprazole (PRILOSEC) 20 MG capsule Take 20 mg by mouth  daily.   Yes [provider]  tamsulosin (FLOMAX) 0.4 MG CAPS capsule Take 0.4 mg by mouth 2 (two) times daily.    Yes [provider]     Allergies:    Allergies  Allergen Reactions  . Dicyclomine Hcl Other (See Comments)    Severe stomach pain   . Sulfa Antibiotics Rash    Social History:   Social History   Socioeconomic History  . Marital status: Married    Spouse name: Not on file  . Number of children: Not on file  . Years of education: Not on file  . Highest education level: Not on file  Occupational History  . Not on file  Social Needs  . Financial resource strain: Not on file  . Food insecurity:    Worry: Not on file    Inability: Not on file  . Transportation needs:    Medical: Not on file    Non-medical: Not on file  Tobacco Use  . Smoking status: Never Smoker  . Smokeless tobacco: Never Used  Substance and Sexual Activity  . Alcohol use: No  . Drug use: No  . Sexual activity: Not on file  Lifestyle  . Physical activity:    Days per week: Not on file    Minutes per session: Not on file  . Stress: Not on file  Relationships  . Social connections:    Talks on phone: Not on file    Gets together: Not on file  Attends religious service: Not on file    Active member of club or organization: Not on file    Attends meetings of clubs or organizations: Not on file    Relationship status: Not on file  . Intimate partner violence:    Fear of current or ex partner: Not on file    Emotionally abused: Not on file    Physically abused: Not on file    Forced sexual activity: Not on file  Other Topics Concern  . Not on file  Social History Narrative  . Not on file    Family History:   The patient's family history includes Ovarian cancer in his mother. There is no history of Heart disease.    ROS:  Please see the history of present illness.  All other ROS reviewed and negative.     Physical Exam/Data:   Vitals:   08/22/18 1116  08/22/18 1132 08/22/18 1146 08/22/18 1208  BP: (!) 150/78  (!) 148/73   Pulse: 66  74   Resp: 15  18   Temp:      TempSrc:      SpO2: 99%  97% 100%  Weight:  84.4 kg    Height:  5\' 9"  (1.753 m)     No intake or output data in the 24 hours ending 08/22/18 1411 Filed Weights   08/22/18 1132  Weight: 84.4 kg   Body mass index is 27.47 kg/m.  General:  Well nourished, well developed, in no acute distress HEENT: normal Lymph: no adenopathy Neck: no JVD Endocrine:  No thryomegaly Vascular: No carotid bruits; FA pulses 2+ bilaterally  Cardiac:  normal S1, S2; RRR; no murmur  Lungs:  clear to auscultation bilaterally, no wheezing, rhonchi or rales  Abd: soft, nontender, no hepatomegaly  Ext: no edema Musculoskeletal:  No deformities, BUE and BLE strength normal and equal Skin: warm and dry  Neuro:  CNs 2-12 intact, no focal abnormalities noted Psych:  Normal affect    EKG:  The ECG that was done was personally reviewed and demonstrates normal sinus rhythm with anteroseptal ST segment elevation consistent with acute injury  Relevant CV Studies: Pending  Laboratory Data:  Chemistry Recent Labs  Lab 08/22/18 1042  NA 139  K 3.6  CL 104  CO2 22  GLUCOSE 125*  BUN 22  CREATININE 1.02  CALCIUM 9.4  GFRNONAA >60  GFRAA >60  ANIONGAP 13    No results for input(s): PROT, ALBUMIN, AST, ALT, ALKPHOS, BILITOT in the last 168 hours. Hematology Recent Labs  Lab 08/22/18 1042  WBC 8.6  RBC 4.95  HGB 15.1  HCT 45.3  MCV 91.5  MCH 30.5  MCHC 33.3  RDW 12.7  PLT 159   Cardiac Enzymes Recent Labs  Lab 08/22/18 1130  TROPONINI 0.35*    Recent Labs  Lab 08/22/18 1109  TROPIPOC 0.17*    BNPNo results for input(s): BNP, PROBNP in the last 168 hours.  DDimer No results for input(s): DDIMER in the last 168 hours.  Radiology/Studies:  Dg Chest 2 View  Result Date: 08/22/2018 CLINICAL DATA:  Sub sternal chest pain. EXAM: CHEST - 2 VIEW COMPARISON:  None.  FINDINGS: The heart size and mediastinal contours are within normal limits. Both lungs are clear. The visualized skeletal structures are unremarkable. IMPRESSION: No active cardiopulmonary disease. Electronically Signed   By: Kerby Moors M.D.   On: 08/22/2018 11:04    Assessment and Plan:   87. 77 year old male with acute anteroseptal STEMI: The  patient is brought directly to the cardiac catheterization lab.  With ongoing chest pain we will proceed emergently with cardiac catheterization and primary PCI.  He is received weight-based heparin 4000 unit bolus as well as aspirin.  Further plan/disposition pending his cardiac catheterization results. 2. Mixed hyperlipidemia: He is taking atorvastatin 10 mg at home.  Will increase his atorvastatin dose to 80 mg as he now has an indication for high intensity statin drug.  Risks, indications, and alternatives to emergency cardiac catheterization are reviewed with the patient in the Cath Lab today.  Emergency implied consent is obtained.  Severity of Illness: The appropriate patient status for this patient is INPATIENT. Inpatient status is judged to be reasonable and necessary in order to provide the required intensity of service to ensure the patient's safety. The patient's presenting symptoms, physical exam findings, and initial radiographic and laboratory data in the context of their chronic comorbidities is felt to place them at high risk for further clinical deterioration. Furthermore, it is not anticipated that the patient will be medically stable for discharge from the hospital within 2 midnights of admission.   * I certify that at the point of admission it is my clinical judgment that the patient will require inpatient hospital care spanning beyond 2 midnights from the point of admission due to high intensity of service, high risk for further deterioration and high frequency of surveillance required.*    For questions or updates, please contact [place] Please consult [email or web address] for contact info under        Signed, Sherren Mocha, MD  08/22/2018 2:11 PM

## 2018-08-22 NOTE — ED Notes (Signed)
Activated code stemi per md miller

## 2018-08-22 NOTE — ED Notes (Signed)
Got patient undress on the zoll pads patient is resting with nurse at bedside and family

## 2018-08-22 NOTE — ED Provider Notes (Signed)
[place] EMERGENCY DEPARTMENT Provider Note   CSN: [id number] Arrival date & time: 08/22/18  1023     History   Chief Complaint Chief Complaint  Patient presents with  . Chest Pain/STEMI    HPI James [name] is a 77 y.o. male.  HPI  The patient is a 77 year old male, he has no prior history of cardiac disease, he does not smoke cigarettes, does not use any illegal drugs.  He reports that approximately 1 week ago he started developing intermittent pain which was short-lived, middle of the chest, seems to have some abnormal feeling in the middle of his left upper arm above the elbow but this would get better spontaneously and was not associated with shortness of breath.  This morning after awakening at 6:00 AM he developed more of an ongoing symptom which is more persistent, it is left side, making him feel shaky, he does not have any associated shortness of breath nausea vomiting swelling of the legs but has felt a bit sweaty this week.  He has had a prior work-up with a stress test in the past but this was negative, this was many years ago.  He is not being treated for hypertension diabetes or but does take a medication for hypercholesterolemia.  At this time the patient symptoms are moderate persistent, mid to left side of the chest, heavy and radiating to the left arm.  Past Medical History:  Diagnosis Date  . BPH (benign prostatic hypertrophy)   . Hyperlipidemia   . Kidney stone   . Prostatitis   . Skin cancer   . Trigger finger     Patient Active Problem List   Diagnosis Date Noted  . STEMI (ST elevation myocardial infarction) ([place]) 08/22/2018  . ST elevation myocardial infarction involving left anterior descending (LAD) coronary artery ([place]) 08/22/2018    No past surgical history on file.      Home Medications    Prior to Admission medications   Medication Sig Start Date End Date Taking? Authorizing Provider  atorvastatin (LIPITOR) 10 MG  tablet Take 10 mg by mouth daily at 6 PM.    Yes [provider]  Multiple Vitamin (MULTIVITAMIN WITH MINERALS) TABS tablet Take 1 tablet by mouth daily.   Yes [provider]  Omega-3 Fatty Acids (FISH OIL) 1000 MG CAPS Take 1,000 mg by mouth at bedtime.    Yes [provider]  omeprazole (PRILOSEC) 20 MG capsule Take 20 mg by mouth daily.   Yes [provider]  tamsulosin (FLOMAX) 0.4 MG CAPS capsule Take 0.4 mg by mouth 2 (two) times daily.    Yes [provider]    Family History Family History  Problem Relation Age of Onset  . Ovarian cancer Mother   . Heart disease Neg Hx     Social History Social History   Tobacco Use  . Smoking status: Never Smoker  . Smokeless tobacco: Never Used  Substance Use Topics  . Alcohol use: No  . Drug use: No     Allergies   Dicyclomine hcl and Sulfa antibiotics   Review of Systems Review of Systems  All other systems reviewed and are negative.    Physical Exam Updated Vital Signs BP (!) 148/73   Pulse 74   Temp 98 F (36.7 C) (Oral)   Resp 18   Ht 1.753 m (5\' 9" )   Wt 84.4 kg   SpO2 100%   BMI 27.47 kg/m   Physical Exam  Vitals signs and nursing note reviewed.  Constitutional:      General: He is not in acute distress.    Appearance: He is well-developed. He is ill-appearing.  HENT:     Head: Normocephalic and atraumatic.     Mouth/Throat:     Pharynx: No oropharyngeal exudate.  Eyes:     General: No scleral icterus.       Right eye: No discharge.        Left eye: No discharge.     Conjunctiva/sclera: Conjunctivae normal.     Pupils: Pupils are equal, round, and reactive to light.  Neck:     Musculoskeletal: Normal range of motion and neck supple.     Thyroid: No thyromegaly.     Vascular: No JVD.  Cardiovascular:     Rate and Rhythm: Normal rate and regular rhythm.     Heart sounds: Normal heart sounds. No murmur. No friction rub. No gallop.   Pulmonary:      Effort: Pulmonary effort is normal. No respiratory distress.     Breath sounds: Normal breath sounds. No wheezing or rales.  Abdominal:     General: Bowel sounds are normal. There is no distension.     Palpations: Abdomen is soft. There is no mass.     Tenderness: There is no abdominal tenderness.  Musculoskeletal: Normal range of motion.        General: No tenderness.  Lymphadenopathy:     Cervical: No cervical adenopathy.  Skin:    General: Skin is warm and dry.     Findings: No erythema or rash.  Neurological:     Mental Status: He is alert.     Coordination: Coordination normal.  Psychiatric:        Behavior: Behavior normal.      ED Treatments / Results  Labs (all labs ordered are listed, but only abnormal results are displayed) Labs Reviewed  BASIC METABOLIC PANEL - Abnormal; Notable for the following components:      Result Value   Glucose, Bld 125 (*)    All other components within normal limits  TROPONIN I - Abnormal; Notable for the following components:   Troponin I 0.35 (*)    All other components within normal limits  I-STAT TROPONIN, ED - Abnormal; Notable for the following components:   Troponin i, poc 0.17 (*)    All other components within normal limits  MRSA PCR SCREENING  CBC  PROTIME-INR  APTT  LIPID PANEL    EKG EKG Interpretation  Date/Time:  Saturday August 22 2018 10:26:26 EST Ventricular Rate:  71 PR Interval:  200 QRS Duration: 82 QT Interval:  384 QTC Calculation: 417 R Axis:   -71 Text Interpretation:  Normal sinus rhythm Left anterior fascicular block Anterolateral infarct , age undetermined Abnormal ECG Since last tracing ST elevation  now present in V1 and V2 Confirmed by Noemi Chapel [phone]) on 08/22/2018 11:24:18 AM   Radiology Dg Chest 2 View  Result Date: 08/22/2018 CLINICAL DATA:  Sub sternal chest pain. EXAM: CHEST - 2 VIEW COMPARISON:  None. FINDINGS: The heart size and mediastinal contours are within normal limits.  Both lungs are clear. The visualized skeletal structures are unremarkable. IMPRESSION: No active cardiopulmonary disease. Electronically Signed   By: Kerby Moors M.D.   On: 08/22/2018 11:04    Procedures .Critical Care Performed by: Noemi Chapel, MD Authorized by: Noemi Chapel, MD   Critical care provider statement:    Critical care time (minutes):  35  Critical care time was exclusive of:  Separately billable procedures and treating other patients and teaching time   Critical care was necessary to treat or prevent imminent or life-threatening deterioration of the following conditions:  Cardiac failure   Critical care was time spent personally by me on the following activities:  Blood draw for specimens, development of treatment plan with patient or surrogate, discussions with consultants, evaluation of patient's response to treatment, examination of patient, obtaining history from patient or surrogate, ordering and performing treatments and interventions, ordering and review of laboratory studies, ordering and review of radiographic studies, pulse oximetry, re-evaluation of patient's condition and review of old charts   (including critical care time)  Medications Ordered in ED Medications  0.9 %  sodium chloride infusion ( Intravenous New Bag/Given 08/22/18 1138)  tirofiban (AGGRASTAT) infusion 50 mcg/mL 100 mL (has no administration in time range)  aspirin chewable tablet 324 mg (324 mg Oral Given 08/22/18 1137)  heparin injection 4,000 Units (4,000 Units Intravenous Given 08/22/18 1141)  tirofiban (AGGRASTAT) infusion 50 mcg/mL 100 mL (0.15 mcg/kg/min  84.4 kg Intravenous New Bag/Given 08/22/18 1240)     Initial Impression / Assessment and Plan / ED Course  I have reviewed the triage vital signs and the nursing notes.  Pertinent labs & imaging results that were available during my care of the patient were reviewed by me and considered in my medical decision making (see chart  for details).     The patient does have some slight hypertension, his blood pressure is in the 150 range, his EKG does show an elevation in leads V1 and V2 consistent with a STEMI, his EKG shows no signs of arrhythmia.  I have activated a code STEMI immediately upon my evaluation of the patient in the room.  His troponin has come back elevated at 0.17, cardiology notified by code STEMI activation.  Heparin and aspirin ordered.  I discussed care with Dr. Burt Knack, he will come to take the patient to the Cath Lab.  Critically ill patient, heparin, aspirin, cardiac monitoring    Final Clinical Impressions(s) / ED Diagnoses   Final diagnoses:  ST elevation myocardial infarction involving left anterior descending (LAD) coronary artery ([facility])      Noemi Chapel, MD 08/22/18 1410

## 2018-08-22 NOTE — Progress Notes (Signed)
Did teaching on TR band and care related to sight.  Teaching on new meds including Brilinta, coreg, and ASA.

## 2018-08-22 NOTE — Progress Notes (Signed)
Saw pt's spouse when on 2H, she said pt was doing well and she was going home for now.  She seemed relieved.

## 2018-08-22 NOTE — Progress Notes (Signed)
   08/22/18 1100  Clinical Encounter Type  Visited With Patient and family together;Health care provider  Visit Type Initial;ED;Code;Other (Comment) (STEMI)  Referral From Other (Comment) (STEMI pg)  Spiritual Encounters  Spiritual Needs Emotional  Stress Factors  Patient Stress Factors Loss of control;Health changes  Family Stress Factors Loss of control   Met w/ pt and spouse in ED rm with RNs and other medical staff present.  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  Walked wife to [facility] waiting rm, let cath lab know she was there.  Myra Gianotti resident, [phone]

## 2018-08-23 ENCOUNTER — Inpatient Hospital Stay (HOSPITAL_COMMUNITY): Payer: Medicare Other

## 2018-08-23 DIAGNOSIS — I2102 ST elevation (STEMI) myocardial infarction involving left anterior descending coronary artery: Secondary | ICD-10-CM

## 2018-08-23 LAB — ECHOCARDIOGRAM COMPLETE
Height: 69 in
Weight: 2976 oz

## 2018-08-23 LAB — CBC
HCT: 41.6 % (ref 39.0–52.0)
Hemoglobin: 13.6 g/dL (ref 13.0–17.0)
MCH: 29.8 pg (ref 26.0–34.0)
MCHC: 32.7 g/dL (ref 30.0–36.0)
MCV: 91.2 fL (ref 80.0–100.0)
Platelets: 155 10*3/uL (ref 150–400)
RBC: 4.56 MIL/uL (ref 4.22–5.81)
RDW: 12.8 % (ref 11.5–15.5)
WBC: 9.9 10*3/uL (ref 4.0–10.5)
nRBC: 0 % (ref 0.0–0.2)

## 2018-08-23 LAB — BASIC METABOLIC PANEL
Anion gap: 12 (ref 5–15)
BUN: 15 mg/dL (ref 8–23)
CO2: 22 mmol/L (ref 22–32)
Calcium: 8.7 mg/dL — ABNORMAL LOW (ref 8.9–10.3)
Chloride: 103 mmol/L (ref 98–111)
Creatinine, Ser: 0.95 mg/dL (ref 0.61–1.24)
GFR calc Af Amer: >60 mL/min (ref >60)
GFR calc non Af Amer: >60 mL/min (ref >60)
Glucose, Bld: 114 mg/dL — ABNORMAL HIGH (ref 70–99)
Potassium: 3.7 mmol/L (ref 3.5–5.1)
Sodium: 137 mmol/L (ref 135–145)

## 2018-08-23 LAB — LIPID PANEL
CHOLESTEROL: 138 mg/dL (ref 0–200)
HDL: 37 mg/dL — ABNORMAL LOW (ref >40)
LDL Cholesterol: 85 mg/dL (ref 0–99)
Total CHOL/HDL Ratio: 3.7 RATIO
Triglycerides: 82 mg/dL (ref <150)
VLDL: 16 mg/dL (ref 0–40)

## 2018-08-23 LAB — TROPONIN I: Troponin I: 62.89 ng/mL (ref <0.03)

## 2018-08-23 NOTE — Progress Notes (Signed)
Echocardiogram 2D Echocardiogram has been performed.  James [name] 08/23/2018, 12:28 PM

## 2018-08-23 NOTE — Progress Notes (Addendum)
Progress Note  Patient Name: James [name] Date of Encounter: 08/23/2018  Primary Cardiologist: Sherren Mocha, MD   Subjective   Had 9/10 substernal chest pain with diaphoresis prior to his cardiac catheterization.  Anteroseptal STEMI was noted.  Currently improved, no significant chest pain or shortness of breath.  Inpatient Medications    Scheduled Meds: . aspirin EC  81 mg Oral Daily  . atorvastatin  80 mg Oral q1800  . carvedilol  3.125 mg Oral BID WC  . heparin  5,000 Units Subcutaneous Q8H  . pantoprazole  40 mg Oral Daily  . sodium chloride flush  3 mL Intravenous Q12H  . sodium chloride flush  3 mL Intravenous Q12H  . tamsulosin  0.4 mg Oral BID  . ticagrelor  90 mg Oral BID   Continuous Infusions: . sodium chloride 20 mL/hr at 08/22/18 1300  . sodium chloride    . sodium chloride     PRN Meds: sodium chloride, sodium chloride, acetaminophen, morphine injection, nitroGLYCERIN, ondansetron (ZOFRAN) IV, oxyCODONE, sodium chloride flush, sodium chloride flush   Vital Signs    Vitals:   08/23/18 0000 08/23/18 0400 08/23/18 0746 08/23/18 0800  BP:    91/64  Pulse:    72  Resp:    18  Temp: 98.3 F (36.8 C) 98.3 F (36.8 C) 98 F (36.7 C)   TempSrc: Oral Oral Oral   SpO2:    95%  Weight:      Height:        Intake/Output Summary (Last 24 hours) at 08/23/2018 0932 Last data filed at 08/23/2018 0800 Gross per 24 hour  Intake 510.33 ml  Output 1850 ml  Net -1339.67 ml   Filed Weights   08/22/18 1132  Weight: 84.4 kg    Telemetry    Sinus rhythm, no ventricular tachycardia no atrial fibrillation- Personally Reviewed  ECG    Sinus rhythm with biphasic T waves noted early precordial leads, subtle ST elevation in these leads as well, improved- Personally Reviewed  Physical Exam   GEN: No acute distress.   Neck: No JVD Cardiac: RRR, no murmurs, rubs, or gallops.  Respiratory: Clear to auscultation bilaterally. GI: Soft, nontender,  non-distended  MS: No edema; No deformity. Neuro:  Nonfocal  Psych: Normal affect   Labs    Chemistry Recent Labs  Lab 08/22/18 1042 08/23/18 0330  NA 139 137  K 3.6 3.7  CL 104 103  CO2 22 22  GLUCOSE 125* 114*  BUN 22 15  CREATININE 1.02 0.95  CALCIUM 9.4 8.7*  GFRNONAA >60 >60  GFRAA >60 >60  ANIONGAP 13 12     Hematology Recent Labs  Lab 08/22/18 1042 08/23/18 0330  WBC 8.6 9.9  RBC 4.95 4.56  HGB 15.1 13.6  HCT 45.3 41.6  MCV 91.5 91.2  MCH 30.5 29.8  MCHC 33.3 32.7  RDW 12.7 12.8  PLT 159 155    Cardiac Enzymes Recent Labs  Lab 08/22/18 1130 08/22/18 1702 08/22/18 2052 08/23/18 0330  TROPONINI 0.35* 28.81* >65.00* 62.89*    Recent Labs  Lab 08/22/18 1109  TROPIPOC 0.17*     BNP Recent Labs  Lab 08/22/18 1702  BNP 186.4*     DDimer No results for input(s): DDIMER in the last 168 hours.   Radiology    Dg Chest 2 View  Result Date: 08/22/2018 CLINICAL DATA:  Sub sternal chest pain. EXAM: CHEST - 2 VIEW COMPARISON:  None. FINDINGS: The heart size and mediastinal contours are  within normal limits. Both lungs are clear. The visualized skeletal structures are unremarkable. IMPRESSION: No active cardiopulmonary disease. Electronically Signed   By: Kerby Moors M.D.   On: 08/22/2018 11:04    Cardiac Studies    Ost Cx to Prox Cx lesion is 25% stenosed.  Prox LAD lesion is 100% stenosed.  A drug-eluting stent was successfully placed using a STENT RESOLUTE ONYX 3.0X18.  Post intervention, there is a 0% residual stenosis.  The left ventricular systolic function is normal.  LV end diastolic pressure is mildly elevated.  The left ventricular ejection fraction is 45-50% by visual estimate.   1.  Acute anteroseptal MI secondary to total occlusion of the mid LAD just after the origin of the first perforator and first diagonal branches 2.  Patent RCA and left circumflex with minimal nonobstructive disease 3.  Mild LV segmental  contraction abnormality with periapical hypokinesis and an LVEF estimated at 45%  Recommendations: Continue Aggrastat x6 hours, reload with ticagrelor 180 mg (patient vomited 10 minutes after initial loading dose), post MI medical therapy.  If no complications arise patient should be eligible for discharge in 61   Patient Profile     77 y.o. male with anterior septal MI  Assessment & Plan    Anteroseptal ST elevation myocardial infarction -Successful DES placement LAD mid just after the first perforator and first diagonal branch. -Continue with aggressive secondary risk factor prevention, beta-blocker statin and dual antiplatelet therapy, cardiac rehabilitation - Troponin greater than 65.  BNP 186  Ischemic cardiomyopathy -EF estimated 45% during cardiac catheterization. -Beta-blocker, ACE inhibitor as tolerated. -Checking echocardiogram  Mixed hyperlipidemia - Increased atorvastatin from 10-80. -LDL calculated 85  BPH   - flomax.  5 out of 7 days/week he does in and out catheterization at home.  Foley cath.  This is okay here.  For questions or updates, please contact [place] Please consult [email or web address] for contact info under        Signed, Candee Furbish, MD  08/23/2018, 9:32 AM

## 2018-08-24 ENCOUNTER — Other Ambulatory Visit: Payer: Self-pay

## 2018-08-24 ENCOUNTER — Encounter (HOSPITAL_COMMUNITY): Payer: Self-pay | Admitting: Cardiovascular Disease

## 2018-08-24 ENCOUNTER — Telehealth: Payer: Self-pay | Admitting: Physician Assistant

## 2018-08-24 MED ORDER — TICAGRELOR 90 MG PO TABS: 90.0000 mg | ORAL_TABLET | Freq: Two times a day (BID) | ORAL | 11 refills | Status: DC

## 2018-08-24 MED ORDER — ASPIRIN 81 MG PO TBEC: 81.0000 mg | DELAYED_RELEASE_TABLET | Freq: Every day | ORAL | 3 refills | Status: DC

## 2018-08-24 MED ORDER — NITROGLYCERIN 0.4 MG SL SUBL: 0.4000 mg | SUBLINGUAL_TABLET | SUBLINGUAL | 12 refills | Status: DC | PRN

## 2018-08-24 MED ORDER — LISINOPRIL 2.5 MG PO TABS: 2.5000 mg | ORAL_TABLET | Freq: Every day | ORAL | Status: DC

## 2018-08-24 MED ORDER — CARVEDILOL 3.125 MG PO TABS: 3.1250 mg | ORAL_TABLET | Freq: Two times a day (BID) | ORAL | 6 refills | Status: DC

## 2018-08-24 MED ORDER — ATORVASTATIN CALCIUM 80 MG PO TABS: 80.0000 mg | ORAL_TABLET | Freq: Every day | ORAL | 11 refills | Status: DC

## 2018-08-24 MED ORDER — LISINOPRIL 2.5 MG PO TABS: 2.5000 mg | ORAL_TABLET | Freq: Every day | ORAL | 6 refills | Status: DC

## 2018-08-24 MED FILL — Lidocaine HCl Local Preservative Free (PF) Inj 1%: INTRAMUSCULAR | Qty: 30 | Status: AC

## 2018-08-24 MED FILL — NITROGLYCERIN 0.4 MG TAB SL: 0.4 | 8 days supply | Qty: 25 | Fill #0 | Status: TO

## 2018-08-24 MED FILL — LISINOPRIL 2.5 MG TABLET: 2.5 | 30 days supply | Qty: 30 | Fill #0 | Status: TO

## 2018-08-24 MED FILL — BRILINTA 90 MG TABLET: 90 | 30 days supply | Qty: 60 | Fill #0 | Status: TO

## 2018-08-24 MED FILL — ASPIRIN LOW DOSE 81 MG TBEC: 81 | 90 days supply | Qty: 90 | Fill #0 | Status: TO

## 2018-08-24 MED FILL — ATORVASTATIN CALCIUM 80 MG: 80 | 30 days supply | Qty: 30 | Fill #0 | Status: TO

## 2018-08-24 MED FILL — CARVEDILOL 3.125 MG TABLET: 3.125 | 30 days supply | Qty: 60 | Fill #0 | Status: TO

## 2018-08-24 NOTE — Care Management (Signed)
Brilinta benefits check sent and pending. CM team will follow to discuss est monthly copay amount and provide Brilinta card once cost has been determined.   Maud Deed. Bethel Sirois BSN, NCM-BC, ACM-RN

## 2018-08-24 NOTE — Care Management (Signed)
#  6.  S/W WILL @ [facility]  RX # [phone]   TICAGRELOR  : NONE FORMULARY  BTILINTA   90 MG BID COVER- YES C0-PAY- $ 371.36 TIER- 3 DRUG PRIOR APPROVAL- NO  DEDUCTIBLE : NOT MET    PREFERRED PHARMACY :  YES -  [facility]

## 2018-08-24 NOTE — Discharge Summary (Signed)
Discharge Summary    Patient ID: James [name] MRN: [id number]; DOB: 11-19-1940  Admit date: 08/22/2018 Discharge date: 08/24/2018  Primary Care Provider: Alroy Dust, L.Marlou Sa, MD  Primary Cardiologist: Sherren Mocha, MD   Discharge Diagnoses    Active Problems:   STEMI (ST elevation myocardial infarction) ([place])   ST elevation myocardial infarction involving left anterior descending (LAD) coronary artery ([facility])   HLD   CAD  Allergies Allergies  Allergen Reactions  . Dicyclomine Hcl Other (See Comments)    Severe stomach pain   . Sulfa Antibiotics Rash    Diagnostic Studies/Procedures    Echo 08/23/18 Study Conclusions  - Left ventricle: The cavity size was normal. Wall thickness was   increased in a pattern of mild LVH. Systolic function was mildly   to moderately reduced. The estimated ejection fraction was in the   range of 40% to 45%. There is akinesis of the   mid-apicalanteroseptal and apical myocardium. Doppler parameters   are consistent with abnormal left ventricular relaxation (grade 1   diastolic dysfunction). - Right ventricle: The cavity size was mildly dilated. Wall   thickness was normal.  Coronary/Graft Acute MI Revascularization  08/22/18  LEFT HEART CATH AND CORONARY ANGIOGRAPHY  Conclusion     Ost Cx to Prox Cx lesion is 25% stenosed.  Prox LAD lesion is 100% stenosed.  A drug-eluting stent was successfully placed using a STENT RESOLUTE ONYX 3.0X18.  Post intervention, there is a 0% residual stenosis.  The left ventricular systolic function is normal.  LV end diastolic pressure is mildly elevated.  The left ventricular ejection fraction is 45-50% by visual estimate.   1.  Acute anteroseptal MI secondary to total occlusion of the mid LAD just after the origin of the first perforator and first diagonal branches 2.  Patent RCA and left circumflex with minimal nonobstructive disease 3.  Mild LV segmental contraction abnormality with  periapical hypokinesis and an LVEF estimated at 45%  Recommendations: Continue Aggrastat x6 hours, reload with ticagrelor 180 mg (patient vomited 10 minutes after initial loading dose), post MI medical therapy.  If no complications arise patient should be eligible for discharge in 48 hours    Diagnostic  Dominance: Right    Intervention       History of Present Illness     James [name] is a 77 y.o. male with history of hyperlipidemia who presented with chest pain and EKG consistent with anteroseptal STEMI.   James [name] awakened in morning at day of presentation with chest pain.  He described substernal central discomfort s a pressure-like sensation. There is associated diaphoresis and shortness of breath.  No nausea or vomiting.  He has had intermittent symptoms of chest discomfort over the past week both with and without exertion. In morning the pain became more severe and constant.  He and his wife drove to the emergency department and arrived by private vehicle.  Initial EKG was suggestive but not diagnostic of STEMI.  His troponin came back mildly elevated and chest pain symptoms persisted.  A repeat EKG showed more clear anteroseptal ST elevation and a code STEMI is called.  Hospital Course     Consultants: None  1. STEMI Cath showed Acute anteroseptal MI secondary to total occlusion of the mid LAD just after the origin of the first perforator and first diagonal branches s/p successful DES placement LAD. Troponin peaked > 65. EF of 45% by cath. Echo showed LVEF of 40-45% with akinesis of the mid-apicalanteroseptal and apical  myocardium and grade 1DD. Continue ASA, Brillinta, stain and BB. No recurrent chest pain. Ambulated well.   2. Ischemic cardiomyopathy - LVEF 40 to 45%. Akinesis of the mid-apicalanteroseptal and apical myocardium and grade 1DD. Euvolemic. Did not required diuretics. Heart failure education given. Continue BB and ACE.   3. HLD - 08/23/2018:  Cholesterol 138; HDL 37; LDL Cholesterol 85; Triglycerides 82; VLDL 16  - Atorvastatin will be increased from 10 mg to 80 mg daily.  - Lipid panel and LFTs in 6 weeks.   She has been seen by Dr. Burt Knack today and deemed ready for discharge home. All follow-up appointments have been scheduled. Discharge medications are listed below.   Discharge Vitals Blood pressure 115/64, pulse 81, temperature 97.8 F (36.6 C), temperature source Oral, resp. rate 13, height 5\' 9"  (1.753 m), weight 81.1 kg, SpO2 94 %.  Filed Weights   08/22/18 1132 08/24/18 0600  Weight: 84.4 kg 81.1 kg    Labs & Radiologic Studies    CBC Recent Labs    08/22/18 1042 08/23/18 0330  WBC 8.6 9.9  HGB 15.1 13.6  HCT 45.3 41.6  MCV 91.5 91.2  PLT 159 601   Basic Metabolic Panel Recent Labs    08/22/18 1042 08/23/18 0330  NA 139 137  K 3.6 3.7  CL 104 103  CO2 22 22  GLUCOSE 125* 114*  BUN 22 15  CREATININE 1.02 0.95  CALCIUM 9.4 8.7*   Cardiac Enzymes Recent Labs    08/22/18 1702 08/22/18 2052 08/23/18 0330  TROPONINI 28.81* >65.00* 62.89*   Hemoglobin A1C Recent Labs    08/22/18 1702  HGBA1C 5.4   Fasting Lipid Panel Recent Labs    08/23/18 0330  CHOL 138  HDL 37*  LDLCALC 85  TRIG 82  CHOLHDL 3.7  _____________  Dg Chest 2 View  Result Date: 08/22/2018 CLINICAL DATA:  Sub sternal chest pain. EXAM: CHEST - 2 VIEW COMPARISON:  None. FINDINGS: The heart size and mediastinal contours are within normal limits. Both lungs are clear. The visualized skeletal structures are unremarkable. IMPRESSION: No active cardiopulmonary disease. Electronically Signed   By: Kerby Moors M.D.   On: 08/22/2018 11:04   Disposition   Pt is being discharged home today in good condition.  Follow-up Plans & Appointments    Follow-up Information    Liliane Shi, PA-C. Go on 09/04/2018.   Specialties:  Cardiology, Physician Assistant Why:  @8 :45am for hospital follow up. Please arrive 15 minutes  early.  Contact information: [address] [address] [phone]          Discharge Instructions    Diet - low sodium heart healthy   Complete by:  As directed    Discharge instructions   Complete by:  As directed    No driving for 2 weeks. No lifting over 10 lbs for 4 weeks. No sexual activity for 4 weeks. You may not return to work until cleared by your cardiologist. Keep procedure site clean & dry. If you notice increased pain, swelling, bleeding or pus, call/return!  You may shower, but no soaking baths/hot tubs/pools for 1 week.   Increase activity slowly   Complete by:  As directed       Discharge Medications   Allergies as of 08/24/2018      Reactions   Dicyclomine Hcl Other (See Comments)   Severe stomach pain   Sulfa Antibiotics Rash      Medication List  TAKE these medications   aspirin 81 MG EC tablet Take 1 tablet (81 mg total) by mouth daily. Start taking on:  August 25, 2018   atorvastatin 80 MG tablet Commonly known as:  LIPITOR Take 1 tablet (80 mg total) by mouth daily at 6 PM. What changed:    medication strength  how much to take   carvedilol 3.125 MG tablet Commonly known as:  COREG Take 1 tablet (3.125 mg total) by mouth 2 (two) times daily with a meal.   Fish Oil 1000 MG Caps Take 1,000 mg by mouth at bedtime.   lisinopril 2.5 MG tablet Commonly known as:  PRINIVIL,ZESTRIL Take 1 tablet (2.5 mg total) by mouth daily.   multivitamin with minerals Tabs tablet Take 1 tablet by mouth daily.   nitroGLYCERIN 0.4 MG SL tablet Commonly known as:  NITROSTAT Place 1 tablet (0.4 mg total) under the tongue every 5 (five) minutes x 3 doses as needed for chest pain.   omeprazole 20 MG capsule Commonly known as:  PRILOSEC Take 20 mg by mouth daily.   tamsulosin 0.4 MG Caps capsule Commonly known as:  FLOMAX Take 0.4 mg by mouth 2 (two) times daily.   ticagrelor 90 MG Tabs tablet Commonly known as:   BRILINTA Take 1 tablet (90 mg total) by mouth 2 (two) times daily.        Acute coronary syndrome (MI, NSTEMI, STEMI, etc) this admission?: Yes.     AHA/ACC Clinical Performance & Quality Measures: 1. Aspirin prescribed? - Yes 2. ADP Receptor Inhibitor (Plavix/Clopidogrel, Brilinta/Ticagrelor or Effient/Prasugrel) prescribed (includes medically managed patients)? - Yes 3. Beta Blocker prescribed? - Yes 4. High Intensity Statin (Lipitor 40-80mg  or Crestor 20-40mg ) prescribed? - Yes 5. EF assessed during THIS hospitalization? - Yes 6. For EF <40%, was ACEI/ARB prescribed? - Yes 7. For EF <40%, Aldosterone Antagonist (Spironolactone or Eplerenone) prescribed? - Not Applicable (EF >/= 50%) 8. Cardiac Rehab Phase II ordered (Included Medically managed Patients)? - Yes     Outstanding Labs/Studies   Lipid panel and LFTs in 6 weeks   Duration of Discharge Encounter   Greater than 30 minutes including physician time.  Jarrett Soho, PA 08/24/2018, 10:23 AM

## 2018-08-24 NOTE — Progress Notes (Signed)
Progress Note  Patient Name: James [name] Date of Encounter: 08/24/2018  Primary Cardiologist: Sherren Mocha, MD   Subjective   Feels well this morning.  No chest pain or shortness of breath.  Inpatient Medications    Scheduled Meds: . aspirin EC  81 mg Oral Daily  . atorvastatin  80 mg Oral q1800  . carvedilol  3.125 mg Oral BID WC  . heparin  5,000 Units Subcutaneous Q8H  . pantoprazole  40 mg Oral Daily  . sodium chloride flush  3 mL Intravenous Q12H  . sodium chloride flush  3 mL Intravenous Q12H  . tamsulosin  0.4 mg Oral BID  . ticagrelor  90 mg Oral BID   Continuous Infusions: . sodium chloride 20 mL/hr at 08/22/18 1300  . sodium chloride    . sodium chloride     PRN Meds: sodium chloride, sodium chloride, acetaminophen, morphine injection, nitroGLYCERIN, ondansetron (ZOFRAN) IV, oxyCODONE, sodium chloride flush, sodium chloride flush   Vital Signs    Vitals:   08/24/18 0700 08/24/18 0800 08/24/18 0805 08/24/18 0806  BP: 112/61 115/64  115/64  Pulse: 75 77  81  Resp: (!) 21 13    Temp:   97.8 F (36.6 C)   TempSrc:   Oral   SpO2: 92% 94%    Weight:      Height:        Intake/Output Summary (Last 24 hours) at 08/24/2018 0849 Last data filed at 08/24/2018 0805 Gross per 24 hour  Intake -  Output 1025 ml  Net -1025 ml   Filed Weights   08/22/18 1132 08/24/18 0600  Weight: 84.4 kg 81.1 kg    Telemetry    Normal sinus rhythm without significant arrhythmia- Personally Reviewed   Physical Exam  Alert, oriented male in no distress GEN: No acute distress.   Neck: No JVD Cardiac: RRR, no murmurs, rubs, or gallops.  Respiratory: Clear to auscultation bilaterally. GI: Soft, nontender, non-distended  MS: No edema; No deformity.  Right radial site is clear. Neuro:  Nonfocal  Psych: Normal affect   Labs    Chemistry Recent Labs  Lab 08/22/18 1042 08/23/18 0330  NA 139 137  K 3.6 3.7  CL 104 103  CO2 22 22  GLUCOSE 125* 114*  BUN 22  15  CREATININE 1.02 0.95  CALCIUM 9.4 8.7*  GFRNONAA >60 >60  GFRAA >60 >60  ANIONGAP 13 12     Hematology Recent Labs  Lab 08/22/18 1042 08/23/18 0330  WBC 8.6 9.9  RBC 4.95 4.56  HGB 15.1 13.6  HCT 45.3 41.6  MCV 91.5 91.2  MCH 30.5 29.8  MCHC 33.3 32.7  RDW 12.7 12.8  PLT 159 155    Cardiac Enzymes Recent Labs  Lab 08/22/18 1130 08/22/18 1702 08/22/18 2052 08/23/18 0330  TROPONINI 0.35* 28.81* >65.00* 62.89*    Recent Labs  Lab 08/22/18 1109  TROPIPOC 0.17*     BNP Recent Labs  Lab 08/22/18 1702  BNP 186.4*     DDimer No results for input(s): DDIMER in the last 168 hours.   Radiology    Dg Chest 2 View  Result Date: 08/22/2018 CLINICAL DATA:  Sub sternal chest pain. EXAM: CHEST - 2 VIEW COMPARISON:  None. FINDINGS: The heart size and mediastinal contours are within normal limits. Both lungs are clear. The visualized skeletal structures are unremarkable. IMPRESSION: No active cardiopulmonary disease. Electronically Signed   By: Kerby Moors M.D.   On: 08/22/2018 11:04  Cardiac Studies   2D Echo: Study Conclusions  - Left ventricle: The cavity size was normal. Wall thickness was   increased in a pattern of mild LVH. Systolic function was mildly   to moderately reduced. The estimated ejection fraction was in the   range of 40% to 45%. There is akinesis of the   mid-apicalanteroseptal and apical myocardium. Doppler parameters   are consistent with abnormal left ventricular relaxation (grade 1   diastolic dysfunction). - Right ventricle: The cavity size was mildly dilated. Wall   thickness was normal.  Patient Profile     77 y.o. male acute anteroseptal STEMI  Assessment & Plan    1.  Acute STEMI involving the LAD: The patient has done well with primary PCI.  He was treated with a drug-eluting stent in the mid LAD.  He had no other significant coronary obstruction noted.  He will be treated with aspirin and ticagrelor for a minimum of 12  months.  He has been initiated on a beta-blocker and high intensity statin drug. 2.  Ischemic cardiomyopathy.  LVEF 40 to 45%.  He is on a low-dose of carvedilol 3.125 mg twice daily.  His blood pressure is in the low normal range, but I think he can tolerate a low-dose of an ACE inhibitor.  We will start him on lisinopril 2.5 mg daily. 3.  Mixed hyperlipidemia: Atorvastatin will be increased from 10 mg to 80 mg daily to get him on a high intensity statin drug.  His LDL cholesterol is 85 mg/dL.  Disposition: Home today  For questions or updates, please contact [place] Please consult [email or web address] for contact info under        Signed, Sherren Mocha, MD  08/24/2018, 8:49 AM

## 2018-08-24 NOTE — Care Management Note (Signed)
Case Management Note  Patient Details  Name: James [name] MRN: [id number] Date of Birth: 04/04/1941  Subjective/Objective: 77 yo male presented with acute STEMI s/p cath with PCI.                 Action/Plan: CM met with patient/spouse to discuss transitional needs. Patient states living at home with spouse, independent with ADLs PTA. PCP verified as: L. Dean Mitchell; pharmacy of choice: [facility], [address]. Brilinta benefits check sent and continue pending. CM will f/u with patient once est monthly benefits amount is obtained, with patient agreeable to a f/u call. Brilinta will be filled by TOC pharmacy and provided prior to transitioning home. No further needs from CM.   Expected Discharge Date:  08/24/18               Expected Discharge Plan:  Home/Self Care  In-House Referral:  NA  Discharge planning Services  CM Consult, Medication Assistance(Brilinta benefits check)  Post Acute Care Choice:  NA Choice offered to:  NA  DME Arranged:  N/A DME Agency:  NA  HH Arranged:  NA HH Agency:  NA  Status of Service:  Completed, signed off  If discussed at Long Length of Stay Meetings, dates discussed:    Additional Comments:  Natalie Gay RN, BSN, NCM-BC, ACM-RN [phone] 08/24/2018, 11:21 AM  

## 2018-08-24 NOTE — Progress Notes (Signed)
Discharged to home with wife. All discharge instructions given to patient and wife. Questions answered.

## 2018-08-24 NOTE — Discharge Instructions (Signed)
Information about your medication: Brilinta (anti-platelet agent)  Generic Name (Brand): ticagrelor (Brilinta), twice daily medication  PURPOSE: You are taking this medication along with aspirin to lower your chance of having a heart attack, stroke, or blood clots in your heart stent. These can be fatal. Brilinta and aspirin help prevent platelets from sticking together and forming a clot that can block an artery or your stent.   Common SIDE EFFECTS you may experience include: bruising or bleeding more easily, shortness of breath after dose (if notice, try drinking caffeinated beverage with dose; effect is temporary and should improve over time)  Do not stop taking BRILINTA without talking to the doctor who prescribes it for you. People who are treated with a stent and stop taking Brilinta too soon, have a higher risk of getting a blood clot in the stent, having a heart attack, or dying. If you stop Brilinta because of bleeding, or for other reasons, your risk of a heart attack or stroke may increase.   Tell all of your doctors and dentists that you are taking Brilinta. They should talk to the doctor who prescribed Brilinta for you before you have any surgery or invasive procedure.   Contact your health care provider if you experience: severe or uncontrollable bleeding, pink/red/brown urine, vomiting blood or vomit that looks like "coffee grounds", red or black stools (looks like tar), coughing up blood or blood clots -------------------------------------------------------------------------------------------------------

## 2018-08-24 NOTE — Telephone Encounter (Signed)
TOC Patient- Please call Patient-  Pt has an appointment on 09-04-18 with Richardson Dopp.

## 2018-08-24 NOTE — Progress Notes (Signed)
CARDIAC REHAB PHASE I   PRE:  Rate/Rhythm: 77 SR  BP:  Supine: 96/57  Sitting:   Standing:    SaO2: 95%RA  MODE:  Ambulation: 370 ft   POST:  Rate/Rhythm: 87 SR  BP:  Supine:   Sitting: 114/64  Standing:    SaO2: 95%RA 0950-1100 Pt walked 370 ft on RA with steady gait and no CP. Tolerated well. MI education completed with pt and wife who voiced understanding. Stressed importance of brilinta with stent. Reviewed MI restrictions, NTG use, ex ed, heart healthy food choices, CRP 2, signs of CHF and importance of watching sodium and daily weights. Referred to [place] CRP 2.   Graylon Good, RN BSN  08/24/2018 10:59 AM

## 2018-08-25 NOTE — Telephone Encounter (Signed)
Patient contacted regarding discharge from [facility] on 08/24/18.  Patient understands to follow up with provider Richardson Dopp PA on 09/04/18 at 8:45 AM at [facility].. Patient understands discharge instructions? yes Patient understands medications and regiment? yes Patient understands to bring all medications to this visit? yes  Patient is concerned about cost of Brilinta, needs phone call for assistance.

## 2018-08-31 ENCOUNTER — Telehealth (HOSPITAL_COMMUNITY): Payer: Self-pay

## 2018-08-31 NOTE — Telephone Encounter (Signed)
Pt insurance is active and benefits verified through [facility]. Co-pay $0.00, DED $0.00/$0.00 met, out of pocket $5,[phone]/$534.64 met, co-insurance 20%. No pre-authorization required. M.J/[employer], 08/31/18 @ 3:15PM, REF# [id number]  Will contact patient to see if he is interested in the Cardiac Rehab Program. If interested, patient will need to complete follow up appt. Once completed, patient will be contacted for scheduling upon review by the RN Navigator.

## 2018-08-31 NOTE — Telephone Encounter (Signed)
Attempted to contact pt in regards to CR, unable to leave VM.

## 2018-09-03 ENCOUNTER — Encounter: Payer: Self-pay | Admitting: Cardiovascular Disease

## 2018-09-04 ENCOUNTER — Ambulatory Visit (INDEPENDENT_AMBULATORY_CARE_PROVIDER_SITE_OTHER): Payer: Medicare Other | Admitting: Physician Assistant

## 2018-09-04 ENCOUNTER — Encounter: Payer: Self-pay | Admitting: Physician Assistant

## 2018-09-04 VITALS — BP 100/60 | HR 65 | Ht 69.0 in | Wt 177.4 lb

## 2018-09-04 DIAGNOSIS — E785 Hyperlipidemia, unspecified: Secondary | ICD-10-CM | POA: Diagnosis not present

## 2018-09-04 DIAGNOSIS — I2102 ST elevation (STEMI) myocardial infarction involving left anterior descending coronary artery: Secondary | ICD-10-CM

## 2018-09-04 DIAGNOSIS — I5042 Chronic combined systolic (congestive) and diastolic (congestive) heart failure: Secondary | ICD-10-CM | POA: Diagnosis not present

## 2018-09-04 DIAGNOSIS — I5022 Chronic systolic (congestive) heart failure: Secondary | ICD-10-CM | POA: Insufficient documentation

## 2018-09-04 HISTORY — DX: Chronic combined systolic (congestive) and diastolic (congestive) heart failure: I50.42

## 2018-09-04 LAB — BASIC METABOLIC PANEL
BUN/Creatinine Ratio: 28 — ABNORMAL HIGH (ref 10–24)
BUN: 27 mg/dL (ref 8–27)
CO2: 22 mmol/L (ref 20–29)
CREATININE: 0.97 mg/dL (ref 0.76–1.27)
Calcium: 9.5 mg/dL (ref 8.6–10.2)
Chloride: 101 mmol/L (ref 96–106)
GFR calc Af Amer: 87 mL/min/{1.73_m2} (ref >59)
GFR, EST NON AFRICAN AMERICAN: 76 mL/min/{1.73_m2} (ref >59)
Glucose: 93 mg/dL (ref 65–99)
POTASSIUM: 4.3 mmol/L (ref 3.5–5.2)
SODIUM: 140 mmol/L (ref 134–144)

## 2018-09-04 NOTE — Progress Notes (Signed)
Cardiology Office Note:    Date:  09/04/2018   ID:  JSON KOELZER, DOB 07/13/1941, MRN [id number]  PCP:  Aurea Graff.James Sa, MD  Cardiologist:  Sherren Mocha, MD  Electrophysiologist:  None  Urologist:  Dr. Lawerance Bach Dermatologist:  Dr. Onalee Hua / Karma Lew, PA-C  Referring MD: Alroy Dust, Carlean Jews.James Sa, MD   Chief Complaint  Patient presents with  . Hospitalization Follow-up    s/p MI >> PCI    History of Present Illness:    James [name] is a 77 y.o. male who was admitted 12/14-12/16 with an anteroseptal ST elevation myocardial infarction.  Emergent Cardiac catheterization demonstrated total occlusion of the mid LAD just after the origin of the first perforator and first diagonal branches.  EF was 45% with periapical hypokinesis.  Patient underwent PCI with a DES to the proximal LAD.  Follow-up echocardiogram demonstrated EF 40-45% with grade 1 diastolic dysfunction.  [name] returns for follow-up.  He is here with his wife.  Since discharge, he has noted occasional left-sided chest discomfort.  This is fairly brief and resolves on its own.  It is not like his anginal symptoms.  He denies shortness of breath, paroxysmal nocturnal dyspnea, lower extremity swelling.  Denies syncope near syncope.  He does have to catheterize himself and has had occasional bleeding.  Otherwise, he denies any significant bleeding issues.   Prior CV studies:   The following studies were reviewed today:  Echocardiogram 08/23/2018 Mild LVH, EF 40-45, anteroseptal and apical akinesis, grade 1 diastolic dysfunction, mildly dilated RV  Cardiac catheterization 08/22/2018 LM normal LAD proximal 100-heavily thrombotic LCx ostial 25 RCA normal EF 45-50, apical hypokinesis PCI: 3 x 18 mm Resolute Onyx DES to the proximal LAD  Past Medical History:  Diagnosis Date  . BPH (benign prostatic hypertrophy)   . CAD (coronary artery disease), native coronary artery    a. Cath 08/22/18 for acute anteroseptal  MI s/p DES to mLAD  . Chronic combined systolic and diastolic heart failure ([place]) 09/04/2018   Ischemic CM // Ant-sept MI 12/19 tx with DES to pLAD  . Hyperlipidemia   . Kidney stone   . Prostatitis   . Skin cancer   . Trigger finger    Surgical Hx: The patient  has a past surgical history that includes Coronary/Graft Acute MI Revascularization (N/A, 08/22/2018) and LEFT HEART CATH AND CORONARY ANGIOGRAPHY (N/A, 08/22/2018).   Current Medications: Current Meds  Medication Sig  . aspirin EC 81 MG EC tablet Take 1 tablet (81 mg total) by mouth daily.  Marland Kitchen atorvastatin (LIPITOR) 80 MG tablet Take 1 tablet (80 mg total) by mouth daily at 6 PM.  . carvedilol (COREG) 3.125 MG tablet Take 1 tablet (3.125 mg total) by mouth 2 (two) times daily with a meal.  . lisinopril (PRINIVIL,ZESTRIL) 2.5 MG tablet Take 1 tablet (2.5 mg total) by mouth daily.  . Multiple Vitamin (MULTIVITAMIN WITH MINERALS) TABS tablet Take 1 tablet by mouth daily.  . nitroGLYCERIN (NITROSTAT) 0.4 MG SL tablet Place 1 tablet (0.4 mg total) under the tongue every 5 (five) minutes x 3 doses as needed for chest pain.  . Omega-3 Fatty Acids (FISH OIL) 1000 MG CAPS Take 1,000 mg by mouth at bedtime.   Marland Kitchen omeprazole (PRILOSEC) 20 MG capsule Take 20 mg by mouth daily.  . tamsulosin (FLOMAX) 0.4 MG CAPS capsule Take 0.4 mg by mouth 2 (two) times daily.   . ticagrelor (BRILINTA) 90 MG TABS tablet Take 1 tablet (90 mg  total) by mouth 2 (two) times daily.     Allergies:   Dicyclomine hcl and Sulfa antibiotics   Social History   Tobacco Use  . Smoking status: Never Smoker  . Smokeless tobacco: Never Used  Substance Use Topics  . Alcohol use: No  . Drug use: No     Family Hx: The patient's family history includes Ovarian cancer in his mother. There is no history of Heart disease.  ROS:   Please see the history of present illness.    ROS All other systems reviewed and are negative.   EKGs/Labs/Other Test Reviewed:    EKG:   EKG is  ordered today.  The ekg ordered today demonstrates normal sinus rhythm, heart rate 65, left axis deviation, anteroseptal Q waves with T wave inversions in V1-V5 (evolving anteroseptal MI), QTC 432  Recent Labs: 08/22/2018: B Natriuretic Peptide 186.4 08/23/2018: BUN 15; Creatinine, Ser 0.95; Hemoglobin 13.6; Platelets 155; Potassium 3.7; Sodium 137   Recent Lipid Panel Lab Results  Component Value Date/Time   CHOL 138 08/23/2018 03:30 AM   TRIG 82 08/23/2018 03:30 AM   HDL 37 (L) 08/23/2018 03:30 AM   CHOLHDL 3.7 08/23/2018 03:30 AM   LDLCALC 85 08/23/2018 03:30 AM    Physical Exam:    VS:  BP 100/60   Pulse 65   Ht 5\' 9"  (1.753 m)   Wt 177 lb 6.4 oz (80.5 kg)   SpO2 98%   BMI 26.20 kg/m     Wt Readings from Last 3 Encounters:  09/04/18 177 lb 6.4 oz (80.5 kg)  08/24/18 178 lb 12.7 oz (81.1 kg)  01/03/16 178 lb (80.7 kg)     Physical Exam  Constitutional: He is oriented to person, place, and time. He appears well-developed and well-nourished. No distress.  HENT:  Head: Normocephalic and atraumatic.  Eyes: No scleral icterus.  Neck: Neck supple. No JVD present. No thyromegaly present.  Cardiovascular: Normal rate, regular rhythm, S1 normal and S2 normal.  No murmur heard. Pulmonary/Chest: Breath sounds normal. He has no rales.  Abdominal: Soft. There is no hepatomegaly.  Musculoskeletal:        General: No edema.     Comments: Right wrist without hematoma  Lymphadenopathy:    He has no cervical adenopathy.  Neurological: He is alert and oriented to person, place, and time.  Skin: Skin is warm and dry.  Psychiatric: He has a normal mood and affect.    ASSESSMENT & PLAN:    ST elevation myocardial infarction involving left anterior descending (LAD) coronary artery (HCC) Status post recent anteroseptal ST elevation myocardial infarction treated with drug-eluting stent to the proximal LAD.  He is doing well since discharge.  He does have a an occasional  left-sided chest pain that is fairly brief.  This is not like his previous angina.  We discussed the importance of uninterrupted dual antiplatelet therapy for 12 months post MI.  He does have growths on his legs that are taken off occasionally by his dermatologist.  I advised him that if his bleeding risk is too great, this procedure would have to be placed on hold for now.  If the growths can be removed while he was taking both aspirin and Brilinta, he may proceed.  I have encouraged him to pursue cardiac rehabilitation.  -Continue aspirin, Brilinta, atorvastatin, carvedilol, lisinopril  -Follow-up with Dr. Burt Knack in 3 months  Chronic combined systolic and diastolic heart failure ([place]) EF 40-45 by echocardiogram post MI.  He is  not on diuretic therapy.  He is not having any symptoms of volume excess.  Volume status appears stable on exam today.  Continue beta-blocker, ACE inhibitor.  Obtain BMET today.  Hyperlipidemia, unspecified hyperlipidemia type Continue high-dose atorvastatin.  He does like to eat grapefruit.  I reviewed with our pharmacist.  I have advised him to try to cut back on this and if he develops any myalgias, he will likely need to stop altogether.  Arrange follow-up lipids and LFTs in 8 weeks.   Dispo:  Return in about 3 months (around 12/04/2018) for Routine Follow Up, w/ Dr. Burt Knack.   Medication Adjustments/Labs and Tests Ordered: Current medicines are reviewed at length with the patient today.  Concerns regarding medicines are outlined above.  Tests Ordered: Orders Placed This Encounter  Procedures  . Basic metabolic panel  . Hepatic function panel  . Lipid panel  . EKG 12-Lead   Medication Changes: No orders of the defined types were placed in this encounter.   Signed, Richardson Dopp, PA-C  09/04/2018 2:08 PM    [facility] [address], [place], [place]  [address] Phone: [phone]; Fax: [phone]

## 2018-09-04 NOTE — Patient Instructions (Addendum)
Medication Instructions:  Your physician recommends that you continue on your current medications as directed. Please refer to the Current Medication list given to you today.  If you need a refill on your cardiac medications before your next appointment, please call your pharmacy.   Lab work: Today: BMET Future: 10/27/18: LFT and Lipids  If you have labs (blood work) drawn today and your tests are completely normal, you will receive your results only by: Marland Kitchen MyChart Message (if you have MyChart) OR . A paper copy in the mail If you have any lab test that is abnormal or we need to change your treatment, we will call you to review the results.  Follow-Up: At [facility], you and your health needs are our priority.  As part of our continuing mission to provide you with exceptional heart care, we have created designated Provider Care Teams.  These Care Teams include your primary Cardiologist (physician) and Advanced Practice Providers (APPs -  Physician Assistants and Nurse Practitioners) who all work together to provide you with the care you need, when you need it. You will need a follow up appointment in:  3 months.  Please call our office 2 months in advance to schedule this appointment.  You may see Sherren Mocha, MD or one of the following Advanced Practice Providers on your designated Care Team: Richardson Dopp, PA-C [place], [place] . Daune Perch, NP

## 2018-09-07 DIAGNOSIS — R339 Retention of urine, unspecified: Secondary | ICD-10-CM | POA: Diagnosis not present

## 2018-09-15 ENCOUNTER — Telehealth (HOSPITAL_COMMUNITY): Payer: Self-pay

## 2018-09-15 ENCOUNTER — Encounter (HOSPITAL_COMMUNITY): Payer: Self-pay

## 2018-09-15 ENCOUNTER — Other Ambulatory Visit: Payer: Self-pay | Admitting: Cardiovascular Disease

## 2018-09-15 MED ORDER — CARVEDILOL 3.125 MG PO TABS: 3.1250 mg | ORAL_TABLET | Freq: Two times a day (BID) | ORAL | 11 refills | Status: DC

## 2018-09-15 MED ORDER — NITROGLYCERIN 0.4 MG SL SUBL: 0.4000 mg | SUBLINGUAL_TABLET | SUBLINGUAL | 6 refills | Status: DC | PRN

## 2018-09-15 MED ORDER — TICAGRELOR 90 MG PO TABS: 90.0000 mg | ORAL_TABLET | Freq: Two times a day (BID) | ORAL | 11 refills | Status: DC

## 2018-09-15 MED ORDER — ATORVASTATIN CALCIUM 80 MG PO TABS: 80.0000 mg | ORAL_TABLET | Freq: Every day | ORAL | 11 refills | Status: DC

## 2018-09-15 MED ORDER — LISINOPRIL 2.5 MG PO TABS: 2.5000 mg | ORAL_TABLET | Freq: Every day | ORAL | 11 refills | Status: DC

## 2018-09-15 NOTE — Telephone Encounter (Signed)
New Message    *STAT* If patient is at the pharmacy, call can be transferred to refill team.   1. Which medications need to be refilled? (please list name of each medication and dose if known)  Atorvastatin 80MG  1x daily   Carvedilol 3.125Mg  2xdaily    lisinopril 2.5 MG 1x daily      Brilinta 90Mg  2x daily                 Nitroglycerin 0.4MG  as needed   2. Which pharmacy/location (including street and city if local pharmacy) is medication to be sent to? [facility] on [address]   3. Do they need a 30 day or 90 day supply? 30 Day

## 2018-09-15 NOTE — Telephone Encounter (Signed)
Attempted to call patient in regards to Cardiac Rehab - unable to leave VM.   Mailed letter 

## 2018-09-15 NOTE — Telephone Encounter (Signed)
Pt's medication was sent to pt's pharmacy as requested. Confirmation received.  °

## 2018-09-16 DIAGNOSIS — Z0000 Encounter for general adult medical examination without abnormal findings: Secondary | ICD-10-CM | POA: Diagnosis not present

## 2018-09-16 DIAGNOSIS — N400 Enlarged prostate without lower urinary tract symptoms: Secondary | ICD-10-CM | POA: Diagnosis not present

## 2018-09-16 DIAGNOSIS — I2510 Atherosclerotic heart disease of native coronary artery without angina pectoris: Secondary | ICD-10-CM | POA: Diagnosis not present

## 2018-09-16 DIAGNOSIS — E7800 Pure hypercholesterolemia, unspecified: Secondary | ICD-10-CM | POA: Diagnosis not present

## 2018-09-18 ENCOUNTER — Telehealth: Payer: Self-pay | Admitting: Physician Assistant

## 2018-09-18 NOTE — Telephone Encounter (Signed)
New Message   Pt's wife is calling wanting to know when the pt will be eligible to take part of the Exercise program  Since he had a heart attack. Please call

## 2018-09-21 ENCOUNTER — Telehealth: Payer: Self-pay | Admitting: Physician Assistant

## 2018-09-21 NOTE — Telephone Encounter (Signed)
He should have heard from cardiac rehab by now. Please contact cardiac rehabilitation and see if they have a referral for him. If not, submit cardiac rehabilitation referral (Dx: myocardial infarction). If they do have referral, see how long before he will be contacted and let patient know. Richardson Dopp, PA-C    09/21/2018 8:30 AM

## 2018-09-21 NOTE — Telephone Encounter (Signed)
New Message   Patient having teeth cleaned and wants to know will it be ok to have it done since he recently had an heart attack.

## 2018-09-21 NOTE — Telephone Encounter (Signed)
New Message   Patient having is teeth cleaned and wants to make sure it's ok since he just had a heart attack.

## 2018-09-22 NOTE — Telephone Encounter (Signed)
Spoke with Marcie Bal from Cardiac rehab and made her aware that Per Richardson Dopp, PA-C has referred pt to Cardiac Rehab and needs to get pt schedule for an appointment. Marcie Bal states that Janett Billow the referral coordinator has tried to reach out to the pt and sent out a letter to last known address. Marcie Bal stated that she will have Janett Billow to call the pt back when she is done with another phone call.  Call pt and made aware that Janett Billow from Cardiac Rehab has tried to reach out to him and also sent a letter. Pt confirmed that he has received the letter and will be calling Cardiac rehab back. I also made pt aware that I have reached out to the Cardiac rehab and Janett Billow will call them back. Pt vebilized understanding and thanked me for the call.

## 2018-09-22 NOTE — Telephone Encounter (Signed)
Made pt aware that per Richardson Dopp, PA-C it is okay to have teeth cleaned as long as it has been 6 weeks from pt last heart attack. Also pt asked it if is okay to have a procedure to cut dark spots off pt legs at [facility], Robyne Askew, PA-C office. Per Nicki Reaper pt cannot stop Brilinta or ASA at this time. I made pt aware that he can have the Dermatology office call our office if they have any questions or concerns about the procedure and the pt being on Brilinta per [facility]. Pt verbalized understanding and thanked me for the call.

## 2018-09-23 ENCOUNTER — Telehealth (HOSPITAL_COMMUNITY): Payer: Self-pay

## 2018-09-23 NOTE — Telephone Encounter (Signed)
Pt wife Baker Janus called to schedule pt for CR. Patient will come in for orientation on 10/29/2018 @ 830AM and will attend the 1:15PM exercise class. Also went over pt insurance, she verbalized understanding.  Mailed homework package.

## 2018-09-24 DIAGNOSIS — D487 Neoplasm of uncertain behavior of other specified sites: Secondary | ICD-10-CM | POA: Diagnosis not present

## 2018-09-24 DIAGNOSIS — C44729 Squamous cell carcinoma of skin of left lower limb, including hip: Secondary | ICD-10-CM | POA: Diagnosis not present

## 2018-10-06 ENCOUNTER — Telehealth: Payer: Self-pay | Admitting: Cardiovascular Disease

## 2018-10-06 DIAGNOSIS — R339 Retention of urine, unspecified: Secondary | ICD-10-CM | POA: Diagnosis not present

## 2018-10-06 NOTE — Telephone Encounter (Signed)
New Message   1. What dental office are you calling from? [facility]    2. What is your office phone number? [phone]  3. What is your fax number? [phone]  4. What type of procedure is the patient having performed? Dental Cleaning   5. What date is procedure scheduled or is the patient there now? 10/06/2018 Patient is there now  (if the patient is at the dentist's office question goes to their cardiologist if he/she is in the office.  If not, question should go to the DOD).   6. What is your question (ex. Antibiotics prior to procedure, holding medication-we need to know how long dentist wants pt to hold med)? Antibiotic needed prior to cleaning

## 2018-10-06 NOTE — Telephone Encounter (Signed)
Called dentist office back. They were wondering if patient needed antibiotics prior to cleaning. Per Dr. Johnsie Cancel, patient does not need any antibiotics. Dentist office verbalized understanding.

## 2018-10-08 ENCOUNTER — Telehealth (HOSPITAL_COMMUNITY): Payer: Self-pay

## 2018-10-08 NOTE — Telephone Encounter (Signed)
Recv'd mailed homework package back. Called and spoke with pt wife James [name] who confirmed address on file is the correct address. Will attempted to mail package again.

## 2018-10-21 ENCOUNTER — Telehealth (HOSPITAL_COMMUNITY): Payer: Self-pay

## 2018-10-21 DIAGNOSIS — C44722 Squamous cell carcinoma of skin of right lower limb, including hip: Secondary | ICD-10-CM | POA: Diagnosis not present

## 2018-10-21 DIAGNOSIS — D485 Neoplasm of uncertain behavior of skin: Secondary | ICD-10-CM | POA: Diagnosis not present

## 2018-10-21 DIAGNOSIS — C44729 Squamous cell carcinoma of skin of left lower limb, including hip: Secondary | ICD-10-CM | POA: Diagnosis not present

## 2018-10-22 NOTE — Telephone Encounter (Signed)
Cardiac Rehab Medication Review by a Pharmacist  Does the patient feel that his/her medications are working for him/her?  yes  Has the patient been experiencing any side effects to the medications prescribed?  no  Does the patient measure his/her own blood pressure or blood glucose at home?  Yes -  Reports BP is stable but does not have log to give readings at this time  Does the patient have any problems obtaining medications due to transportation or finances?   no  Understanding of regimen: good Understanding of indications: good Potential of compliance: good  Pharmacist comments: none  James [name], PharmD PGY1 Pharmacy Resident Phone [phone] 10/22/2018       3:38 PM

## 2018-10-25 NOTE — Progress Notes (Signed)
James [name] 78 y.o. male DOB 30-Oct-1940 MRN [id number]       Nutrition Screen Note  No diagnosis found. Past Medical History:  Diagnosis Date  . BPH (benign prostatic hypertrophy)   . CAD (coronary artery disease), native coronary artery    a. Cath 08/22/18 for acute anteroseptal MI s/p DES to mLAD  . Chronic combined systolic and diastolic heart failure ([facility]) 09/04/2018   Ischemic CM // Ant-sept MI 12/19 tx with DES to pLAD  . Hyperlipidemia   . Kidney stone   . Prostatitis   . Skin cancer   . Trigger finger    Meds reviewed.     Current Outpatient Medications (Cardiovascular):  .  atorvastatin (LIPITOR) 80 MG tablet, Take 1 tablet (80 mg total) by mouth daily at 6 PM. .  carvedilol (COREG) 3.125 MG tablet, Take 1 tablet (3.125 mg total) by mouth 2 (two) times daily with a meal. .  lisinopril (PRINIVIL,ZESTRIL) 2.5 MG tablet, Take 1 tablet (2.5 mg total) by mouth daily. .  nitroGLYCERIN (NITROSTAT) 0.4 MG SL tablet, Place 1 tablet (0.4 mg total) under the tongue every 5 (five) minutes x 3 doses as needed for chest pain. (Patient not taking: Reported on 10/22/2018)   Current Outpatient Medications (Analgesics):  .  aspirin EC 81 MG EC tablet, Take 1 tablet (81 mg total) by mouth daily.  Current Outpatient Medications (Hematological):  .  ticagrelor (BRILINTA) 90 MG TABS tablet, Take 1 tablet (90 mg total) by mouth 2 (two) times daily.  Current Outpatient Medications (Other):  Marland Kitchen  Multiple Vitamin (MULTIVITAMIN WITH MINERALS) TABS tablet, Take 1 tablet by mouth daily. .  Omega-3 Fatty Acids (FISH OIL) 1000 MG CAPS, Take 1,000 mg by mouth at bedtime.  Marland Kitchen  omeprazole (PRILOSEC) 20 MG capsule, Take 20 mg by mouth daily. .  tamsulosin (FLOMAX) 0.4 MG CAPS capsule, Take 0.4 mg by mouth 2 (two) times daily.    HT: Ht Readings from Last 1 Encounters:  09/04/18 5\' 9"  (1.753 m)    WT: Wt Readings from Last 5 Encounters:  09/04/18 177 lb 6.4 oz (80.5 kg)  08/24/18 178 lb 12.7 oz  (81.1 kg)  01/03/16 178 lb (80.7 kg)     BMI = 26.19  Current tobacco use? No       Labs:  Lipid Panel     Component Value Date/Time   CHOL 138 08/23/2018 0330   TRIG 82 08/23/2018 0330   HDL 37 (L) 08/23/2018 0330   CHOLHDL 3.7 08/23/2018 0330   VLDL 16 08/23/2018 0330   LDLCALC 85 08/23/2018 0330    Lab Results  Component Value Date   HGBA1C 5.4 08/22/2018   CBG (last 3)  No results for input(s): GLUCAP in the last 72 hours.  Nutrition Diagnosis ? Food-and nutrition-related knowledge deficit related to lack of exposure to information as related to diagnosis of: ? CVD  Nutrition Goal(s):  ? To be determined  Plan:  Pt to attend nutrition classes ? Nutrition I ? Nutrition II ? Portion Distortion  ? Diabetes Blitz ? Diabetes Q & A Will provide client-centered nutrition education as part of interdisciplinary care.   Monitor and evaluate progress toward nutrition goal with team.  Laurina Bustle, MS, RD, LDN 10/25/2018 1:23 PM

## 2018-10-27 ENCOUNTER — Other Ambulatory Visit: Payer: Medicare Other

## 2018-10-27 ENCOUNTER — Telehealth: Payer: Self-pay

## 2018-10-27 DIAGNOSIS — E785 Hyperlipidemia, unspecified: Secondary | ICD-10-CM | POA: Diagnosis not present

## 2018-10-27 NOTE — Telephone Encounter (Signed)
He can do yard work and lift up to 40 lbs. Richardson Dopp, PA-C    10/27/2018 12:32 PM

## 2018-10-27 NOTE — Telephone Encounter (Signed)
I left a message for the patient to return my call about Scott's recommendations.

## 2018-10-27 NOTE — Telephone Encounter (Signed)
Made pt aware of Richardson Dopp, PA-C recommendations and pt verbalized understanding.

## 2018-10-27 NOTE — Telephone Encounter (Signed)
Patient came in to get lab work (lfts, lipids) done today and wanted to see Richardson Dopp, PA-C to ask some questions but didn't have an appt to see him. I call pt from the lobby and took him to a empty pod/room to discuss the questions that he would like me to relay to [place].   *Patient would like to know his restrictions since his stent was placed about 10 weeks ago? He would like to know if he can now do some light yard such as racking or picking up a 10 lb bag of potatoes? *Pt states that he is walking over 1 hour everyday since his stent was placed with no problems. *Pt has also provided a daily weight record and I have made a copy.   I did make sure to remind pt that he does have a f/u appt with Richardson Dopp, PA on 3/27 @ 10:15 am. Pt thanked me for listening to his questions to relay to [place]. I made pt aware that I will let Nicki Reaper know and give him a call about his recommendations.

## 2018-10-28 DIAGNOSIS — R339 Retention of urine, unspecified: Secondary | ICD-10-CM | POA: Diagnosis not present

## 2018-10-28 LAB — LIPID PANEL
Chol/HDL Ratio: 2.9 ratio (ref 0.0–5.0)
Cholesterol, Total: 112 mg/dL (ref 100–199)
HDL: 38 mg/dL — ABNORMAL LOW (ref >39)
LDL Calculated: 60 mg/dL (ref 0–99)
Triglycerides: 69 mg/dL (ref 0–149)
VLDL CHOLESTEROL CAL: 14 mg/dL (ref 5–40)

## 2018-10-28 LAB — HEPATIC FUNCTION PANEL
ALT: 34 IU/L (ref 0–44)
AST: 24 IU/L (ref 0–40)
Albumin: 4.8 g/dL — ABNORMAL HIGH (ref 3.7–4.7)
Alkaline Phosphatase: 74 IU/L (ref 39–117)
Bilirubin Total: 0.5 mg/dL (ref 0.0–1.2)
Bilirubin, Direct: 0.16 mg/dL (ref 0.00–0.40)
Total Protein: 6.6 g/dL (ref 6.0–8.5)

## 2018-10-29 ENCOUNTER — Encounter (HOSPITAL_COMMUNITY)
Admission: RE | Admit: 2018-10-29 | Discharge: 2018-10-29 | Disposition: A | Discharge status: Home / Self Care | Payer: Medicare Other | Source: Ambulatory Visit | Attending: Cardiovascular Disease | Admitting: Cardiovascular Disease

## 2018-10-29 ENCOUNTER — Encounter (HOSPITAL_COMMUNITY): Payer: Self-pay

## 2018-10-29 VITALS — BP 102/64 | HR 83 | Ht 67.5 in | Wt 174.6 lb

## 2018-10-29 DIAGNOSIS — Z955 Presence of coronary angioplasty implant and graft: Secondary | ICD-10-CM | POA: Diagnosis not present

## 2018-10-29 DIAGNOSIS — I2102 ST elevation (STEMI) myocardial infarction involving left anterior descending coronary artery: Secondary | ICD-10-CM | POA: Diagnosis not present

## 2018-10-29 NOTE — Progress Notes (Addendum)
Cardiac Individual Treatment Plan  Patient Details  Name: James [name] MRN: [id number] Date of Birth: 1941/03/14 Referring Provider:     [facility] from 10/29/2018 in [facility]  Referring Provider  Dr. Burt Knack       Initial Encounter Date:    CARDIAC REHAB PHASE II ORIENTATION from 10/29/2018 in [facility]  Date  10/29/18      Visit Diagnosis: ST elevation myocardial infarction involving left anterior descending (LAD) coronary artery (HCC)n12/14/19  Status post coronary artery stent placement 09/04/18 DES LAD  Patient's Home Medications on Admission:  Current Outpatient Medications:  .  aspirin EC 81 MG EC tablet, Take 1 tablet (81 mg total) by mouth daily., Disp: 90 tablet, Rfl: 3 .  atorvastatin (LIPITOR) 80 MG tablet, Take 1 tablet (80 mg total) by mouth daily at 6 PM., Disp: 30 tablet, Rfl: 11 .  carvedilol (COREG) 3.125 MG tablet, Take 1 tablet (3.125 mg total) by mouth 2 (two) times daily with a meal., Disp: 60 tablet, Rfl: 11 .  lisinopril (PRINIVIL,ZESTRIL) 2.5 MG tablet, Take 1 tablet (2.5 mg total) by mouth daily., Disp: 30 tablet, Rfl: 11 .  Multiple Vitamin (MULTIVITAMIN WITH MINERALS) TABS tablet, Take 1 tablet by mouth daily., Disp: , Rfl:  .  nitroGLYCERIN (NITROSTAT) 0.4 MG SL tablet, Place 1 tablet (0.4 mg total) under the tongue every 5 (five) minutes x 3 doses as needed for chest pain. (Patient not taking: Reported on 10/22/2018), Disp: 25 tablet, Rfl: 6 .  Omega-3 Fatty Acids (FISH OIL) 1000 MG CAPS, Take 1,000 mg by mouth at bedtime. , Disp: , Rfl:  .  omeprazole (PRILOSEC) 20 MG capsule, Take 20 mg by mouth daily., Disp: , Rfl:  .  tamsulosin (FLOMAX) 0.4 MG CAPS capsule, Take 0.4 mg by mouth 2 (two) times daily. , Disp: , Rfl:  .  ticagrelor (BRILINTA) 90 MG TABS tablet, Take 1 tablet (90 mg total) by mouth 2 (two) times daily., Disp: 60 tablet, Rfl: 11  Past Medical  History: Past Medical History:  Diagnosis Date  . BPH (benign prostatic hypertrophy)   . CAD (coronary artery disease), native coronary artery    a. Cath 08/22/18 for acute anteroseptal MI s/p DES to mLAD  . Chronic combined systolic and diastolic heart failure ([place]) 09/04/2018   Ischemic CM // Ant-sept MI 12/19 tx with DES to pLAD  . Hyperlipidemia   . Kidney stone   . Prostatitis   . Skin cancer   . Trigger finger     Tobacco Use: Social History   Tobacco Use  Smoking Status Never Smoker  Smokeless Tobacco Never Used    Labs: Recent Review Flowsheet Data    Labs for ITP Cardiac and Pulmonary Rehab Latest Ref Rng & Units 08/22/2018 08/23/2018 10/27/2018   Cholestrol 100 - 199 mg/dL 156 138 112   LDLCALC 0 - 99 mg/dL 93 85 60   HDL >39 mg/dL 43 37(L) 38(L)   Trlycerides 0 - 149 mg/dL 101 82 69   Hemoglobin A1c 4.8 - 5.6 % 5.4 - -      Capillary Blood Glucose: No results found for: GLUCAP   Exercise Target Goals: Exercise Program Goal: Individual exercise prescription set using results from initial 6 min walk test and THRR while considering  patient's activity barriers and safety.   Exercise Prescription Goal: Initial exercise prescription builds to 30-45 minutes a day of aerobic activity, 2-3 days per  week.  Home exercise guidelines will be given to patient during program as part of exercise prescription that the participant will acknowledge.  Activity Barriers & Risk Stratification: Activity Barriers & Cardiac Risk Stratification - 10/29/18 1049      Activity Barriers & Cardiac Risk Stratification   Activity Barriers  Muscular Weakness;Deconditioning    Cardiac Risk Stratification  High       6 Minute Walk: 6 Minute Walk    Row Name 10/29/18 1048         6 Minute Walk   Phase  Initial     Distance  1613 feet     Walk Time  6 minutes     # of Rest Breaks  0     MPH  3.1     METS  3.02     RPE  11     VO2 Peak  10.57     Symptoms  No     Resting HR   83 bpm     Resting BP  102/64     Resting Oxygen Saturation   97 %     Exercise Oxygen Saturation  during 6 min walk  97 %     Max Ex. HR  92 bpm     Max Ex. BP  122/60     2 Minute Post BP  104/70        Oxygen Initial Assessment:   Oxygen Re-Evaluation:   Oxygen Discharge (Final Oxygen Re-Evaluation):   Initial Exercise Prescription: Initial Exercise Prescription - 10/29/18 1100      Date of Initial Exercise RX and Referring Provider   Date  10/29/18    Referring Provider  Dr. Burt Knack     Expected Discharge Date  02/05/19      Recumbant Bike   Level  1.5    Watts  30    Minutes  10    METs  3.18      NuStep   Level  2    SPM  75    Minutes  10    METs  3      Track   Laps  12    Minutes  10    METs  3.07      Prescription Details   Frequency (times per week)  3    Duration  Progress to 30 minutes of continuous aerobic without signs/symptoms of physical distress      Intensity   THRR 40-80% of Max Heartrate  58-115    Ratings of Perceived Exertion  11-13      Progression   Progression  Continue to progress workloads to maintain intensity without signs/symptoms of physical distress.      Resistance Training   Training Prescription  Yes    Weight  3 lbs.     Reps  10-15       Perform Capillary Blood Glucose checks as needed.  Exercise Prescription Changes:   Exercise Comments:   Exercise Goals and Review:  Exercise Goals    Row Name 10/29/18 1049             Exercise Goals   Increase Physical Activity  Yes       Intervention  Provide advice, education, support and counseling about physical activity/exercise needs.;Develop an individualized exercise prescription for aerobic and resistive training based on initial evaluation findings, risk stratification, comorbidities and participant's personal goals.       Expected Outcomes  Short Term: Attend rehab on a  regular basis to increase amount of physical activity.       Increase Strength and  Stamina  Yes       Intervention  Provide advice, education, support and counseling about physical activity/exercise needs.;Develop an individualized exercise prescription for aerobic and resistive training based on initial evaluation findings, risk stratification, comorbidities and participant's personal goals.       Expected Outcomes  Short Term: Increase workloads from initial exercise prescription for resistance, speed, and METs.       Able to understand and use rate of perceived exertion (RPE) scale  Yes       Intervention  Provide education and explanation on how to use RPE scale       Expected Outcomes  Short Term: Able to use RPE daily in rehab to express subjective intensity level;Long Term:  Able to use RPE to guide intensity level when exercising independently       Knowledge and understanding of Target Heart Rate Range (THRR)  Yes       Intervention  Provide education and explanation of THRR including how the numbers were predicted and where they are located for reference       Expected Outcomes  Long Term: Able to use THRR to govern intensity when exercising independently;Short Term: Able to state/look up THRR;Short Term: Able to use daily as guideline for intensity in rehab       Able to check pulse independently  Yes       Intervention  Provide education and demonstration on how to check pulse in carotid and radial arteries.;Review the importance of being able to check your own pulse for safety during independent exercise       Expected Outcomes  Short Term: Able to explain why pulse checking is important during independent exercise;Long Term: Able to check pulse independently and accurately       Understanding of Exercise Prescription  Yes       Intervention  Provide education, explanation, and written materials on patient's individual exercise prescription       Expected Outcomes  Short Term: Able to explain program exercise prescription;Long Term: Able to explain home exercise  prescription to exercise independently          Exercise Goals Re-Evaluation :   Discharge Exercise Prescription (Final Exercise Prescription Changes):   Nutrition:  Target Goals: Understanding of nutrition guidelines, daily intake of sodium 1500mg , cholesterol 200mg , calories 30% from fat and 7% or less from saturated fats, daily to have 5 or more servings of fruits and vegetables.  Biometrics: Pre Biometrics - 10/29/18 1050      Pre Biometrics   Height  5' 7.5" (1.715 m)    Weight  79.2 kg    Waist Circumference  38.5 inches    Hip Circumference  39 inches    Waist to Hip Ratio  0.99 %    BMI (Calculated)  26.93    Triceps Skinfold  10 mm    % Body Fat  25.3 %    Grip Strength  30 kg    Flexibility  14 in    Single Leg Stand  7.72 seconds        Nutrition Therapy Plan and Nutrition Goals:   Nutrition Assessments:   Nutrition Goals Re-Evaluation:   Nutrition Goals Re-Evaluation:   Nutrition Goals Discharge (Final Nutrition Goals Re-Evaluation):   Psychosocial: Target Goals: Acknowledge presence or absence of significant depression and/or stress, maximize coping skills, provide positive support system. Participant is able  to verbalize types and ability to use techniques and skills needed for reducing stress and depression.  Initial Review & Psychosocial Screening: Initial Psych Review & Screening - 10/29/18 0846      Initial Review   Current issues with  None Identified      Family Dynamics   Good Support System?  Yes   Pt lists his spouse as a source of support.      Barriers   Psychosocial barriers to participate in program  There are no identifiable barriers or psychosocial needs.      Screening Interventions   Interventions  Encouraged to exercise       Quality of Life Scores: Quality of Life - 10/29/18 0847      Quality of Life   Select  Quality of Life      Quality of Life Scores   Health/Function Pre  29.6 %    Socioeconomic Pre  28  %    Psych/Spiritual Pre  30 %    Family Pre  30 %    GLOBAL Pre  29.45 %      Scores of 19 and below usually indicate a poorer quality of life in these areas.  A difference of  2-3 points is a clinically meaningful difference.  A difference of 2-3 points in the total score of the Quality of Life Index has been associated with significant improvement in overall quality of life, self-image, physical symptoms, and general health in studies assessing change in quality of life.  PHQ-9: Recent Review Flowsheet Data    There is no flowsheet data to display.     Interpretation of Total Score  Total Score Depression Severity:  1-4 = Minimal depression, 5-9 = Mild depression, 10-14 = Moderate depression, 15-19 = Moderately severe depression, 20-27 = Severe depression   Psychosocial Evaluation and Intervention:   Psychosocial Re-Evaluation:   Psychosocial Discharge (Final Psychosocial Re-Evaluation):   Vocational Rehabilitation: Provide vocational rehab assistance to qualifying candidates.   Vocational Rehab Evaluation & Intervention: Vocational Rehab - 10/29/18 0847      Initial Vocational Rehab Evaluation & Intervention   Assessment shows need for Vocational Rehabilitation  No       Education: Education Goals: Education classes will be provided on a weekly basis, covering required topics. Participant will state understanding/return demonstration of topics presented.  Learning Barriers/Preferences: Learning Barriers/Preferences - 10/29/18 1052      Learning Barriers/Preferences   Learning Barriers  None    Learning Preferences  Video;Skilled Demonstration;Pictoral       Education Topics: Count Your Pulse:  -Group instruction provided by verbal instruction, demonstration, patient participation and written materials to support subject.  Instructors address importance of being able to find your pulse and how to count your pulse when at home without a heart monitor.  Patients  get hands on experience counting their pulse with staff help and individually.   Heart Attack, Angina, and Risk Factor Modification:  -Group instruction provided by verbal instruction, video, and written materials to support subject.  Instructors address signs and symptoms of angina and heart attacks.    Also discuss risk factors for heart disease and how to make changes to improve heart health risk factors.   Functional Fitness:  -Group instruction provided by verbal instruction, demonstration, patient participation, and written materials to support subject.  Instructors address safety measures for doing things around the house.  Discuss how to get up and down off the floor, how to pick things up properly, how  to safely get out of a chair without assistance, and balance training.   Meditation and Mindfulness:  -Group instruction provided by verbal instruction, patient participation, and written materials to support subject.  Instructor addresses importance of mindfulness and meditation practice to help reduce stress and improve awareness.  Instructor also leads participants through a meditation exercise.    Stretching for Flexibility and Mobility:  -Group instruction provided by verbal instruction, patient participation, and written materials to support subject.  Instructors lead participants through series of stretches that are designed to increase flexibility thus improving mobility.  These stretches are additional exercise for major muscle groups that are typically performed during regular warm up and cool down.   Hands Only CPR:  -Group verbal, video, and participation provides a basic overview of AHA guidelines for community CPR. Role-play of emergencies allow participants the opportunity to practice calling for help and chest compression technique with discussion of AED use.   Hypertension: -Group verbal and written instruction that provides a basic overview of hypertension including  the most recent diagnostic guidelines, risk factor reduction with self-care instructions and medication management.    Nutrition I class: Heart Healthy Eating:  -Group instruction provided by PowerPoint slides, verbal discussion, and written materials to support subject matter. The instructor gives an explanation and review of the Therapeutic Lifestyle Changes diet recommendations, which includes a discussion on lipid goals, dietary fat, sodium, fiber, plant stanol/sterol esters, sugar, and the components of a well-balanced, healthy diet.   Nutrition II class: Lifestyle Skills:  -Group instruction provided by PowerPoint slides, verbal discussion, and written materials to support subject matter. The instructor gives an explanation and review of label reading, grocery shopping for heart health, heart healthy recipe modifications, and ways to make healthier choices when eating out.   Diabetes Question & Answer:  -Group instruction provided by PowerPoint slides, verbal discussion, and written materials to support subject matter. The instructor gives an explanation and review of diabetes co-morbidities, pre- and post-prandial blood glucose goals, pre-exercise blood glucose goals, signs, symptoms, and treatment of hypoglycemia and hyperglycemia, and foot care basics.   Diabetes Blitz:  -Group instruction provided by PowerPoint slides, verbal discussion, and written materials to support subject matter. The instructor gives an explanation and review of the physiology behind type 1 and type 2 diabetes, diabetes medications and rational behind using different medications, pre- and post-prandial blood glucose recommendations and Hemoglobin A1c goals, diabetes diet, and exercise including blood glucose guidelines for exercising safely.    Portion Distortion:  -Group instruction provided by PowerPoint slides, verbal discussion, written materials, and food models to support subject matter. The instructor  gives an explanation of serving size versus portion size, changes in portions sizes over the last 20 years, and what consists of a serving from each food group.   Stress Management:  -Group instruction provided by verbal instruction, video, and written materials to support subject matter.  Instructors review role of stress in heart disease and how to cope with stress positively.     Exercising on Your Own:  -Group instruction provided by verbal instruction, power point, and written materials to support subject.  Instructors discuss benefits of exercise, components of exercise, frequency and intensity of exercise, and end points for exercise.  Also discuss use of nitroglycerin and activating EMS.  Review options of places to exercise outside of rehab.  Review guidelines for sex with heart disease.   Cardiac Drugs I:  -Group instruction provided by verbal instruction and written materials to support subject.  Instructor reviews cardiac drug classes: antiplatelets, anticoagulants, beta blockers, and statins.  Instructor discusses reasons, side effects, and lifestyle considerations for each drug class.   Cardiac Drugs II:  -Group instruction provided by verbal instruction and written materials to support subject.  Instructor reviews cardiac drug classes: angiotensin converting enzyme inhibitors (ACE-I), angiotensin II receptor blockers (ARBs), nitrates, and calcium channel blockers.  Instructor discusses reasons, side effects, and lifestyle considerations for each drug class.   Anatomy and Physiology of the Circulatory System:  Group verbal and written instruction and models provide basic cardiac anatomy and physiology, with the coronary electrical and arterial systems. Review of: AMI, Angina, Valve disease, Heart Failure, Peripheral Artery Disease, Cardiac Arrhythmia, Pacemakers, and the ICD.   Other Education:  -Group or individual verbal, written, or video instructions that support the  educational goals of the cardiac rehab program.   Holiday Eating Survival Tips:  -Group instruction provided by PowerPoint slides, verbal discussion, and written materials to support subject matter. The instructor gives patients tips, tricks, and techniques to help them not only survive but enjoy the holidays despite the onslaught of food that accompanies the holidays.   Knowledge Questionnaire Score: Knowledge Questionnaire Score - 10/29/18 0847      Knowledge Questionnaire Score   Pre Score  22/24       Core Components/Risk Factors/Patient Goals at Admission: Personal Goals and Risk Factors at Admission - 10/29/18 1217      Core Components/Risk Factors/Patient Goals on Admission    Weight Management  Yes;Weight Maintenance    Intervention  Weight Management: Develop a combined nutrition and exercise program designed to reach desired caloric intake, while maintaining appropriate intake of nutrient and fiber, sodium and fats, and appropriate energy expenditure required for the weight goal.;Weight Management: Provide education and appropriate resources to help participant work on and attain dietary goals.    Admit Weight  174 lb 9.7 oz (79.2 kg)    Expected Outcomes  Short Term: Continue to assess and modify interventions until short term weight is achieved;Long Term: Adherence to nutrition and physical activity/exercise program aimed toward attainment of established weight goal;Weight Maintenance: Understanding of the daily nutrition guidelines, which includes 25-35% calories from fat, 7% or less cal from saturated fats, less than 200mg  cholesterol, less than 1.5gm of sodium, & 5 or more servings of fruits and vegetables daily;Understanding recommendations for meals to include 15-35% energy as protein, 25-35% energy from fat, 35-60% energy from carbohydrates, less than 200mg  of dietary cholesterol, 20-35 gm of total fiber daily;Understanding of distribution of calorie intake throughout the day  with the consumption of 4-5 meals/snacks    Heart Failure  Yes    Intervention  Provide a combined exercise and nutrition program that is supplemented with education, support and counseling about heart failure. Directed toward relieving symptoms such as shortness of breath, decreased exercise tolerance, and extremity edema.    Expected Outcomes  Short term: Attendance in program 2-3 days a week with increased exercise capacity. Reported lower sodium intake. Reported increased fruit and vegetable intake. Reports medication compliance.;Short term: Daily weights obtained and reported for increase. Utilizing diuretic protocols set by physician.;Improve functional capacity of life;Long term: Adoption of self-care skills and reduction of barriers for early signs and symptoms recognition and intervention leading to self-care maintenance.    Hypertension  Yes    Intervention  Provide education on lifestyle modifcations including regular physical activity/exercise, weight management, moderate sodium restriction and increased consumption of fresh fruit, vegetables, and low fat dairy, alcohol  moderation, and smoking cessation.;Monitor prescription use compliance.    Expected Outcomes  Short Term: Continued assessment and intervention until BP is < 140/12mm HG in hypertensive participants. < 130/42mm HG in hypertensive participants with diabetes, heart failure or chronic kidney disease.;Long Term: Maintenance of blood pressure at goal levels.    Lipids  Yes    Intervention  Provide education and support for participant on nutrition & aerobic/resistive exercise along with prescribed medications to achieve LDL 70mg , HDL >40mg .    Expected Outcomes  Short Term: Participant states understanding of desired cholesterol values and is compliant with medications prescribed. Participant is following exercise prescription and nutrition guidelines.;Long Term: Cholesterol controlled with medications as prescribed, with  individualized exercise RX and with personalized nutrition plan. Value goals: LDL < 70mg , HDL > 40 mg.    Stress  Yes    Intervention  Offer individual and/or small group education and counseling on adjustment to heart disease, stress management and health-related lifestyle change. Teach and support self-help strategies.;Refer participants experiencing significant psychosocial distress to appropriate mental health specialists for further evaluation and treatment. When possible, include family members and significant others in education/counseling sessions.    Expected Outcomes  Long Term: Emotional wellbeing is indicated by absence of clinically significant psychosocial distress or social isolation.;Short Term: Participant demonstrates changes in health-related behavior, relaxation and other stress management skills, ability to obtain effective social support, and compliance with psychotropic medications if prescribed.       Core Components/Risk Factors/Patient Goals Review:    Core Components/Risk Factors/Patient Goals at Discharge (Final Review):    ITP Comments: ITP Comments    Row Name 10/29/18 0851           ITP Comments  Dr. Fransico Him, Medical Director          Comments: Jenny Reichmann attended orientation from [phone] to 1023 to review rules and guidelines for program. Completed 6 minute walk test, Intitial ITP, and exercise prescription.  VSS. 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  Asymptomatic.Barnet Pall, RN,BSN 10/29/2018 12:19 PM

## 2018-10-29 NOTE — Progress Notes (Signed)
James [name] 78 y.o. male DOB: 02/21/1941 MRN: [id number]      Nutrition Note  1. ST elevation myocardial infarction involving left anterior descending (LAD) coronary artery (HCC)n12/14/19   2. Status post coronary artery stent placement 09/04/18 DES LAD    Past Medical History:  Diagnosis Date  . BPH (benign prostatic hypertrophy)   . CAD (coronary artery disease), native coronary artery    a. Cath 08/22/18 for acute anteroseptal MI s/p DES to mLAD  . Chronic combined systolic and diastolic heart failure ([place]) 09/04/2018   Ischemic CM // Ant-sept MI 12/19 tx with DES to pLAD  . Hyperlipidemia   . Kidney stone   . Prostatitis   . Skin cancer   . Trigger finger    Meds reviewed.    Current Outpatient Medications (Cardiovascular):  .  atorvastatin (LIPITOR) 80 MG tablet, Take 1 tablet (80 mg total) by mouth daily at 6 PM. .  carvedilol (COREG) 3.125 MG tablet, Take 1 tablet (3.125 mg total) by mouth 2 (two) times daily with a meal. .  lisinopril (PRINIVIL,ZESTRIL) 2.5 MG tablet, Take 1 tablet (2.5 mg total) by mouth daily. .  nitroGLYCERIN (NITROSTAT) 0.4 MG SL tablet, Place 1 tablet (0.4 mg total) under the tongue every 5 (five) minutes x 3 doses as needed for chest pain. (Patient not taking: Reported on 10/22/2018)   Current Outpatient Medications (Analgesics):  .  aspirin EC 81 MG EC tablet, Take 1 tablet (81 mg total) by mouth daily.  Current Outpatient Medications (Hematological):  .  ticagrelor (BRILINTA) 90 MG TABS tablet, Take 1 tablet (90 mg total) by mouth 2 (two) times daily.  Current Outpatient Medications (Other):  Marland Kitchen  Multiple Vitamin (MULTIVITAMIN WITH MINERALS) TABS tablet, Take 1 tablet by mouth daily. .  Omega-3 Fatty Acids (FISH OIL) 1000 MG CAPS, Take 1,000 mg by mouth at bedtime.  Marland Kitchen  omeprazole (PRILOSEC) 20 MG capsule, Take 20 mg by mouth daily. .  tamsulosin (FLOMAX) 0.4 MG CAPS capsule, Take 0.4 mg by mouth 2 (two) times daily.    HT: Ht Readings from  Last 1 Encounters:  10/29/18 5' 7.5" (1.715 m)    WT: Wt Readings from Last 5 Encounters:  10/29/18 174 lb 9.7 oz (79.2 kg)  09/04/18 177 lb 6.4 oz (80.5 kg)  08/24/18 178 lb 12.7 oz (81.1 kg)  01/03/16 178 lb (80.7 kg)     Body mass index is 26.94 kg/m.   Current tobacco use? No  Labs:  Lipid Panel     Component Value Date/Time   CHOL 112 10/27/2018 0840   TRIG 69 10/27/2018 0840   HDL 38 (L) 10/27/2018 0840   CHOLHDL 2.9 10/27/2018 0840   CHOLHDL 3.7 08/23/2018 0330   VLDL 16 08/23/2018 0330   LDLCALC 60 10/27/2018 0840    Lab Results  Component Value Date   HGBA1C 5.4 08/22/2018   CBG (last 3)  No results for input(s): GLUCAP in the last 72 hours.  Nutrition Note Spoke with pt. Nutrition plan and goals reviewed with pt. Pt is following Step 2 of the Therapeutic Lifestyle Changes diet. Pt wants to gain weight, about 5-10 lbs, pt has not done anything to help with this. Wt gain/maintenance tips reviewed (label reading, how to build a healthy plate, portion sizes, eating frequently across the day).  Pt with dx of CHF. Per discussion, pt does use canned/convenience foods often. Pt does not add salt to food. Pt does not eat out frequently. Pt expressed understanding  of the information reviewed. Pt aware of nutrition education classes offered and would like to attend nutrition classes.  Nutrition Diagnosis ? Food-and nutrition-related knowledge deficit related to lack of exposure to information as related to diagnosis of: ? CVD   Nutrition Intervention ? Pt's individual nutrition plan and goals reviewed with pt.  Nutrition Goal(s):  ? Pt to identify and limit food sources of saturated fat, trans fat, refined carbohydrates and sodium  ? Pt to identify food quantities necessary to achieve weight maintenance/ weight gain of 5-10 lb at graduation from cardiac rehab  ? Pt to build a healthy plate including vegetables, fruits, whole grains, and low-fat dairy products in a heart  healthy meal plan   Plan:  ? Pt to attend nutrition classes:  ? Nutrition I ? Nutrition II ? Portion Distortion  ? Will provide client-centered nutrition education as part of interdisciplinary care ? Monitor and evaluate progress toward nutrition goal with team.   Laurina Bustle, MS, RD, LDN 10/29/2018 3:05 PM

## 2018-11-02 ENCOUNTER — Ambulatory Visit (HOSPITAL_COMMUNITY): Payer: Medicare Other

## 2018-11-02 ENCOUNTER — Encounter (HOSPITAL_COMMUNITY): Payer: Self-pay

## 2018-11-02 ENCOUNTER — Encounter (HOSPITAL_COMMUNITY)
Admission: RE | Admit: 2018-11-02 | Discharge: 2018-11-02 | Disposition: A | Discharge status: Home / Self Care | Payer: Medicare Other | Source: Ambulatory Visit | Attending: Cardiovascular Disease | Admitting: Cardiovascular Disease

## 2018-11-02 DIAGNOSIS — I2102 ST elevation (STEMI) myocardial infarction involving left anterior descending coronary artery: Secondary | ICD-10-CM | POA: Diagnosis not present

## 2018-11-02 DIAGNOSIS — Z955 Presence of coronary angioplasty implant and graft: Secondary | ICD-10-CM | POA: Diagnosis not present

## 2018-11-04 ENCOUNTER — Ambulatory Visit (HOSPITAL_COMMUNITY): Payer: Medicare Other

## 2018-11-04 ENCOUNTER — Encounter (HOSPITAL_COMMUNITY)
Admission: RE | Admit: 2018-11-04 | Discharge: 2018-11-04 | Disposition: A | Discharge status: Home / Self Care | Payer: Medicare Other | Source: Ambulatory Visit | Attending: Cardiovascular Disease | Admitting: Cardiovascular Disease

## 2018-11-04 DIAGNOSIS — I2102 ST elevation (STEMI) myocardial infarction involving left anterior descending coronary artery: Secondary | ICD-10-CM | POA: Diagnosis not present

## 2018-11-04 DIAGNOSIS — Z955 Presence of coronary angioplasty implant and graft: Secondary | ICD-10-CM

## 2018-11-04 NOTE — Progress Notes (Signed)
James [name] 78 y.o. male Nutrition Note Spoke with pt. Nutrition Plan and Nutrition Survey goals reviewed with pt. Pt is following a Heart Healthy diet. Pt wants to gain weight, about 5-10 lbs, or would be happy with weight maintenance. Pt has not done anything to help with weight gain currently. Wt gain/ weightmaintenance tips reviewed with patient today(label reading, how to build a healthy plate, portion sizes, eating frequently across the day, incorporation of heart healthy fats with meals and snacks). Pt shared that he is the sole cook in the house, and making any dietary changes would be easy to do. Pt shared that since orientation he has started to read nutrition labels to check for sodium, unhealthy fats, and added sugars. Praised pt and encouraged him to keep it up. Pt expressed understanding of the information reviewed. Pt aware of nutrition education classes offered and would like to attend nutrition classes.  Lab Results  Component Value Date   HGBA1C 5.4 08/22/2018    Wt Readings from Last 3 Encounters:  10/29/18 174 lb 9.7 oz (79.2 kg)  09/04/18 177 lb 6.4 oz (80.5 kg)  08/24/18 178 lb 12.7 oz (81.1 kg)    Nutrition Diagnosis ? Food-and nutrition-related knowledge deficit related to lack of exposure to information as related to diagnosis of: ? CVD  Nutrition Intervention ? Pt's individual nutrition plan reviewed with pt. ? Benefits of adopting Heart Healthy diet discussed when [employer] reviewed.    Goal(s)  Pt to identify and limit food sources of saturated fat, trans fat, refined carbohydrates and sodium   Pt to identify food quantities necessary to achieve weight maintenance/ weight gain of 5-10 lb at graduation from cardiac rehab   Pt to build a healthy plate including vegetables, fruits, whole grains, and low-fat dairy products in a heart healthy meal plan  Plan:   Pt to attend nutrition classes ? Nutrition I ? Nutrition II ? Portion Distortion   Will provide  client-centered nutrition education as part of interdisciplinary care  Monitor and evaluate progress toward nutrition goal with team.    Laurina Bustle, MS, RD, LDN 11/04/2018 2:11 PM

## 2018-11-06 ENCOUNTER — Encounter (HOSPITAL_COMMUNITY)
Admission: RE | Admit: 2018-11-06 | Discharge: 2018-11-06 | Disposition: A | Discharge status: Home / Self Care | Payer: Medicare Other | Source: Ambulatory Visit | Attending: Cardiovascular Disease | Admitting: Cardiovascular Disease

## 2018-11-06 ENCOUNTER — Ambulatory Visit (HOSPITAL_COMMUNITY): Payer: Medicare Other

## 2018-11-06 DIAGNOSIS — I2102 ST elevation (STEMI) myocardial infarction involving left anterior descending coronary artery: Secondary | ICD-10-CM

## 2018-11-06 DIAGNOSIS — Z955 Presence of coronary angioplasty implant and graft: Secondary | ICD-10-CM | POA: Diagnosis not present

## 2018-11-09 ENCOUNTER — Ambulatory Visit (HOSPITAL_COMMUNITY): Payer: Medicare Other

## 2018-11-09 ENCOUNTER — Encounter (HOSPITAL_COMMUNITY)
Admission: RE | Admit: 2018-11-09 | Discharge: 2018-11-09 | Disposition: A | Discharge status: Home / Self Care | Payer: Medicare Other | Source: Ambulatory Visit | Attending: Cardiovascular Disease | Admitting: Cardiovascular Disease

## 2018-11-09 DIAGNOSIS — I2102 ST elevation (STEMI) myocardial infarction involving left anterior descending coronary artery: Secondary | ICD-10-CM | POA: Insufficient documentation

## 2018-11-09 DIAGNOSIS — Z955 Presence of coronary angioplasty implant and graft: Secondary | ICD-10-CM | POA: Insufficient documentation

## 2018-11-11 ENCOUNTER — Ambulatory Visit (HOSPITAL_COMMUNITY): Payer: Medicare Other

## 2018-11-11 ENCOUNTER — Encounter (HOSPITAL_COMMUNITY)
Admission: RE | Admit: 2018-11-11 | Discharge: 2018-11-11 | Disposition: A | Discharge status: Home / Self Care | Payer: Medicare Other | Source: Ambulatory Visit | Attending: Cardiovascular Disease | Admitting: Cardiovascular Disease

## 2018-11-11 DIAGNOSIS — C44722 Squamous cell carcinoma of skin of right lower limb, including hip: Secondary | ICD-10-CM | POA: Diagnosis not present

## 2018-11-11 DIAGNOSIS — I2102 ST elevation (STEMI) myocardial infarction involving left anterior descending coronary artery: Secondary | ICD-10-CM | POA: Diagnosis not present

## 2018-11-11 DIAGNOSIS — Z955 Presence of coronary angioplasty implant and graft: Secondary | ICD-10-CM

## 2018-11-11 NOTE — Progress Notes (Signed)
Cardiac Individual Treatment Plan  Patient Details  Name: James [name] MRN: [id number] Date of Birth: 11/22/1940 Referring Provider:   Flowsheet Row CARDIAC REHAB PHASE II ORIENTATION from 10/29/2018 in [place]  Referring Provider  Dr. Burt Knack       Initial Encounter Date:  Flowsheet Row CARDIAC REHAB PHASE II ORIENTATION from 10/29/2018 in [place]  Date  10/29/18      Visit Diagnosis: ST elevation myocardial infarction involving left anterior descending (LAD) coronary artery (HCC)n12/14/19  Status post coronary artery stent placement 09/04/18 DES LAD  Patient's Home Medications on Admission:  Current Outpatient Medications:  .  aspirin EC 81 MG EC tablet, Take 1 tablet (81 mg total) by mouth daily., Disp: 90 tablet, Rfl: 3 .  atorvastatin (LIPITOR) 80 MG tablet, Take 1 tablet (80 mg total) by mouth daily at 6 PM., Disp: 30 tablet, Rfl: 11 .  carvedilol (COREG) 3.125 MG tablet, Take 1 tablet (3.125 mg total) by mouth 2 (two) times daily with a meal., Disp: 60 tablet, Rfl: 11 .  lisinopril (PRINIVIL,ZESTRIL) 2.5 MG tablet, Take 1 tablet (2.5 mg total) by mouth daily., Disp: 30 tablet, Rfl: 11 .  Multiple Vitamin (MULTIVITAMIN WITH MINERALS) TABS tablet, Take 1 tablet by mouth daily., Disp: , Rfl:  .  nitroGLYCERIN (NITROSTAT) 0.4 MG SL tablet, Place 1 tablet (0.4 mg total) under the tongue every 5 (five) minutes x 3 doses as needed for chest pain., Disp: 25 tablet, Rfl: 6 .  Omega-3 Fatty Acids (FISH OIL) 1000 MG CAPS, Take 1,000 mg by mouth at bedtime. , Disp: , Rfl:  .  omeprazole (PRILOSEC) 20 MG capsule, Take 20 mg by mouth daily., Disp: , Rfl:  .  tamsulosin (FLOMAX) 0.4 MG CAPS capsule, Take 0.4 mg by mouth 2 (two) times daily. , Disp: , Rfl:  .  ticagrelor (BRILINTA) 90 MG TABS tablet, Take 1 tablet (90 mg total) by mouth 2 (two) times daily., Disp: 60 tablet, Rfl: 11  Past Medical History: Past Medical  History:  Diagnosis Date  . BPH (benign prostatic hypertrophy)   . CAD (coronary artery disease), native coronary artery    a. Cath 08/22/18 for acute anteroseptal MI s/p DES to mLAD  . Chronic combined systolic and diastolic heart failure ([place]) 09/04/2018   Ischemic CM // Ant-sept MI 12/19 tx with DES to pLAD  . Hyperlipidemia   . Kidney stone   . Prostatitis   . Skin cancer   . Trigger finger     Tobacco Use: Social History   Tobacco Use  Smoking Status Never Smoker  Smokeless Tobacco Never Used    Labs: Recent Review Flowsheet Data    Labs for ITP Cardiac and Pulmonary Rehab Latest Ref Rng & Units 08/22/2018 08/23/2018 10/27/2018   Cholestrol 100 - 199 mg/dL 156 138 112   LDLCALC 0 - 99 mg/dL 93 85 60   HDL >39 mg/dL 43 37(L) 38(L)   Trlycerides 0 - 149 mg/dL 101 82 69   Hemoglobin A1c 4.8 - 5.6 % 5.4 - -      Capillary Blood Glucose: No results found for: GLUCAP   Exercise Target Goals: Exercise Program Goal: Individual exercise prescription set using results from initial 6 min walk test and THRR while considering  patient's activity barriers and safety.   Exercise Prescription Goal: Initial exercise prescription builds to 30-45 minutes a day of aerobic activity, 2-3 days per week.  Home exercise guidelines will  be given to patient during program as part of exercise prescription that the participant will acknowledge.  Activity Barriers & Risk Stratification: Activity Barriers & Cardiac Risk Stratification - 10/29/18 1049    Activity Barriers & Cardiac Risk Stratification          Activity Barriers  Muscular Weakness;Deconditioning    Cardiac Risk Stratification  High           6 Minute Walk: 6 Minute Walk    6 Minute Walk    Row Name 10/29/18 1048   Phase  Initial   Distance  1613 feet   Walk Time  6 minutes   # of Rest Breaks  0   MPH  3.1   METS  3.02   RPE  11   VO2 Peak  10.57   Symptoms  No   Resting HR  83 bpm   Resting BP  102/64    Resting Oxygen Saturation   97 %   Exercise Oxygen Saturation  during 6 min walk  97 %   Max Ex. HR  92 bpm   Max Ex. BP  122/60   2 Minute Post BP  104/70          Oxygen Initial Assessment:   Oxygen Re-Evaluation:   Oxygen Discharge (Final Oxygen Re-Evaluation):   Initial Exercise Prescription: Initial Exercise Prescription - 10/29/18 1100    Date of Initial Exercise RX and Referring Provider          Date  10/29/18    Referring Provider  Dr. Burt Knack     Expected Discharge Date  02/05/19        Recumbant Bike          Level  1.5    Watts  30    Minutes  10    METs  3.18        NuStep          Level  2    SPM  75    Minutes  10    METs  3        Track          Laps  12    Minutes  10    METs  3.07        Prescription Details          Frequency (times per week)  3    Duration  Progress to 30 minutes of continuous aerobic without signs/symptoms of physical distress        Intensity          THRR 40-80% of Max Heartrate  58-115    Ratings of Perceived Exertion  11-13        Progression          Progression  Continue to progress workloads to maintain intensity without signs/symptoms of physical distress.        Resistance Training          Training Prescription  Yes    Weight  3 lbs.     Reps  10-15           Perform Capillary Blood Glucose checks as needed.  Exercise Prescription Changes: Exercise Prescription Changes    Response to Exercise    Row Name 11/02/18 1500   Blood Pressure (Admit)  120/70   Blood Pressure (Exercise)  154/80   Blood Pressure (Exit)  108/58   Heart Rate (Admit)  69 bpm   Heart Rate (Exercise)  105 bpm  Heart Rate (Exit)  79 bpm   Rating of Perceived Exertion (Exercise)  13   Symptoms  None   Comments  Pt first day of exercise.    Duration  Continue with 30 min of aerobic exercise without signs/symptoms of physical distress.   Intensity  THRR unchanged       Progression    Row Name 11/02/18 1500    Progression  Continue to progress workloads to maintain intensity without signs/symptoms of physical distress.   Average METs  2.9       Resistance Training    Row Name 11/02/18 1500   Training Prescription  Yes   Weight  3 lbs.    Reps  10-15   Time  [name] 11/02/18 1500   Level  1.5   Watts  22   Minutes  10   METs  2.79       NuStep    Row Name 11/02/18 1500   Level  2   SPM  75   Minutes  10   METs  3       Track    Row Name 11/02/18 1500   Laps  16   Minutes  10   METs  3.09          Exercise Comments: Exercise Comments    Row Name 11/02/18 1504 11/10/18 1107   Exercise Comments  Pt first day of exercise. Pt tolerated exercsie well.   Reviewed METs and goals with Pt. Pt understands goals and is exercising at home in addition to Cardiac Rehab.       Exercise Goals and Review: Exercise Goals    Exercise Goals    Row Name 10/29/18 1049   Increase Physical Activity  Yes   Intervention  Provide advice, education, support and counseling about physical activity/exercise needs.;Develop an individualized exercise prescription for aerobic and resistive training based on initial evaluation findings, risk stratification, comorbidities and participant's personal goals.   Expected Outcomes  Short Term: Attend rehab on a regular basis to increase amount of physical activity.   Increase Strength and Stamina  Yes   Intervention  Provide advice, education, support and counseling about physical activity/exercise needs.;Develop an individualized exercise prescription for aerobic and resistive training based on initial evaluation findings, risk stratification, comorbidities and participant's personal goals.   Expected Outcomes  Short Term: Increase workloads from initial exercise prescription for resistance, speed, and METs.   Able to understand and use rate of perceived exertion (RPE) scale  Yes   Intervention  Provide education and  explanation on how to use RPE scale   Expected Outcomes  Short Term: Able to use RPE daily in rehab to express subjective intensity level;Long Term:  Able to use RPE to guide intensity level when exercising independently   Knowledge and understanding of Target Heart Rate Range (THRR)  Yes   Intervention  Provide education and explanation of THRR including how the numbers were predicted and where they are located for reference   Expected Outcomes  Long Term: Able to use THRR to govern intensity when exercising independently;Short Term: Able to state/look up THRR;Short Term: Able to use daily as guideline for intensity in rehab   Able to check pulse independently  Yes   Intervention  Provide education and demonstration on how to check pulse in carotid and radial arteries.;Review the importance of being able to check your own pulse for safety  during independent exercise   Expected Outcomes  Short Term: Able to explain why pulse checking is important during independent exercise;Long Term: Able to check pulse independently and accurately   Understanding of Exercise Prescription  Yes   Intervention  Provide education, explanation, and written materials on patient's individual exercise prescription   Expected Outcomes  Short Term: Able to explain program exercise prescription;Long Term: Able to explain home exercise prescription to exercise independently          Exercise Goals Re-Evaluation : Exercise Goals Re-Evaluation    Exercise Goal Re-Evaluation    Row Name 11/02/18 1502 11/10/18 1106   Exercise Goals Review  Increase Physical Activity;Increase Strength and Stamina;Able to understand and use rate of perceived exertion (RPE) scale;Knowledge and understanding of Target Heart Rate Range (THRR);Understanding of Exercise Prescription  Increase Physical Activity;Increase Strength and Stamina;Able to understand and use rate of perceived exertion (RPE) scale;Knowledge and understanding of Target Heart  Rate Range (THRR);Understanding of Exercise Prescription   Comments  Pt first day of Cardiac Rehab. Pt tolerated exercise well and understands RPE scale, THRR, and workloads. Will increase workloads as Pt tolerates.   Reviewed METs and goals with Pt. Pt MET level is 2.9. Pt has been progressing well and increasing workloads. Pt is walking 5 days a week for 1 hour in addition to Cardiac Rehab.    Expected Outcomes  Will continue to monitor and progress Pt as tolerated.   Will continue to monitor and progress Pt as tolerated.           Discharge Exercise Prescription (Final Exercise Prescription Changes): Exercise Prescription Changes - 11/02/18 1500    Response to Exercise          Blood Pressure (Admit)  120/70    Blood Pressure (Exercise)  154/80    Blood Pressure (Exit)  108/58    Heart Rate (Admit)  69 bpm    Heart Rate (Exercise)  105 bpm    Heart Rate (Exit)  79 bpm    Rating of Perceived Exertion (Exercise)  13    Symptoms  None    Comments  Pt first day of exercise.     Duration  Continue with 30 min of aerobic exercise without signs/symptoms of physical distress.    Intensity  THRR unchanged        Progression          Progression  Continue to progress workloads to maintain intensity without signs/symptoms of physical distress.    Average METs  2.9        Resistance Training          Training Prescription  Yes    Weight  3 lbs.     Reps  10-15    Time  10 Minutes        Recumbant Bike          Level  1.5    Watts  22    Minutes  10    METs  2.79        NuStep          Level  2    SPM  75    Minutes  10    METs  3        Track          Laps  16    Minutes  10    METs  3.09           Nutrition:  Target Goals: Understanding of  nutrition guidelines, daily intake of sodium '1500mg'$ , cholesterol '200mg'$ , calories 30% from fat and 7% or less from saturated fats, daily to have 5 or more servings of fruits and vegetables.  Biometrics: Pre Biometrics -  10/29/18 1050    Pre Biometrics          Height  5' 7.5" (1.715 m)    Weight  79.2 kg    Waist Circumference  38.5 inches    Hip Circumference  39 inches    Waist to Hip Ratio  0.99 %    BMI (Calculated)  26.93    Triceps Skinfold  10 mm    % Body Fat  25.3 %    Grip Strength  30 kg    Flexibility  14 in    Single Leg Stand  7.72 seconds            Nutrition Therapy Plan and Nutrition Goals: Nutrition Therapy & Goals - 10/29/18 1507    Nutrition Therapy          Diet  heart healthy        Personal Nutrition Goals          Nutrition Goal  Pt to identify and limit food sources of saturated fat, trans fat, refined carbohydrates and sodium     Personal Goal #2  Pt to identify food quantities necessary to achieve weight maintenance/ weight gain of 5-10 lb at graduation from cardiac rehab     Personal Goal #3  Pt to build a healthy plate including vegetables, fruits, whole grains, and low-fat dairy products in a heart healthy meal plan        Intervention Plan          Intervention  Prescribe, educate and counsel regarding individualized specific dietary modifications aiming towards targeted core components such as weight, hypertension, lipid management, diabetes, heart failure and other comorbidities.    Expected Outcomes  Short Term Goal: Understand basic principles of dietary content, such as calories, fat, sodium, cholesterol and nutrients.;Long Term Goal: Adherence to prescribed nutrition plan.           Nutrition Assessments: Nutrition Assessments - 10/29/18 1507    MEDFICTS Scores          Pre Score  30           Nutrition Goals Re-Evaluation: Nutrition Goals Re-Evaluation    Goals    Row Name 10/29/18 1507   Current Weight  174 lb 9.7 oz (79.2 kg)          Nutrition Goals Re-Evaluation: Nutrition Goals Re-Evaluation    Goals    Row Name 10/29/18 1507   Current Weight  174 lb 9.7 oz (79.2 kg)          Nutrition Goals Discharge (Final Nutrition  Goals Re-Evaluation): Nutrition Goals Re-Evaluation - 10/29/18 1507    Goals          Current Weight  174 lb 9.7 oz (79.2 kg)           Psychosocial: Target Goals: Acknowledge presence or absence of significant depression and/or stress, maximize coping skills, provide positive support system. Participant is able to verbalize types and ability to use techniques and skills needed for reducing stress and depression.  Initial Review & Psychosocial Screening: Initial Psych Review & Screening - 10/29/18 0846    Initial Review          Current issues with  None Identified        Family Dynamics  Good Support System?  Yes   Pt lists his spouse as a source of support.        Barriers          Psychosocial barriers to participate in program  There are no identifiable barriers or psychosocial needs.        Screening Interventions          Interventions  Encouraged to exercise           Quality of Life Scores: Quality of Life - 10/29/18 0847    Quality of Life          Select  Quality of Life        Quality of Life Scores          Health/Function Pre  29.6 %    Socioeconomic Pre  28 %    Psych/Spiritual Pre  30 %    Family Pre  30 %    GLOBAL Pre  29.45 %          Scores of 19 and below usually indicate a poorer quality of life in these areas.  A difference of  2-3 points is a clinically meaningful difference.  A difference of 2-3 points in the total score of the Quality of Life Index has been associated with significant improvement in overall quality of life, self-image, physical symptoms, and general health in studies assessing change in quality of life.  PHQ-9: Recent Review Flowsheet Data    There is no flowsheet data to display.     Interpretation of Total Score  Total Score Depression Severity:  1-4 = Minimal depression, 5-9 = Mild depression, 10-14 = Moderate depression, 15-19 = Moderately severe depression, 20-27 = Severe depression    Psychosocial Evaluation and Intervention: Psychosocial Evaluation - 11/02/18 1519    Psychosocial Evaluation & Interventions          Interventions  Encouraged to exercise with the program and follow exercise prescription    Comments  no psychosocial needs identified, no interventions necessary. pt enjoys fishing.     Expected Outcomes  pt will exhibit positive outlook with good coping skills.      Continue Psychosocial Services   No Follow up required           Psychosocial Re-Evaluation:   Psychosocial Discharge (Final Psychosocial Re-Evaluation):   Vocational Rehabilitation: Provide vocational rehab assistance to qualifying candidates.   Vocational Rehab Evaluation & Intervention: Vocational Rehab - 10/29/18 0847    Initial Vocational Rehab Evaluation & Intervention          Assessment shows need for Vocational Rehabilitation  No           Education: Education Goals: Education classes will be provided on a weekly basis, covering required topics. Participant will state understanding/return demonstration of topics presented.  Learning Barriers/Preferences: Learning Barriers/Preferences - 10/29/18 1052    Learning Barriers/Preferences          Learning Barriers  None    Learning Preferences  Video;Skilled Demonstration;Pictoral           Education Topics: Count Your Pulse:  -Group instruction provided by verbal instruction, demonstration, patient participation and written materials to support subject.  Instructors address importance of being able to find your pulse and how to count your pulse when at home without a heart monitor.  Patients get hands on experience counting their pulse with staff help and individually.   Heart Attack, Angina, and Risk Factor Modification:  -Group instruction provided by verbal  instruction, video, and written materials to support subject.  Instructors address signs and symptoms of angina and heart attacks.    Also discuss risk  factors for heart disease and how to make changes to improve heart health risk factors.   Functional Fitness:  -Group instruction provided by verbal instruction, demonstration, patient participation, and written materials to support subject.  Instructors address safety measures for doing things around the house.  Discuss how to get up and down off the floor, how to pick things up properly, how to safely get out of a chair without assistance, and balance training.   Meditation and Mindfulness:  -Group instruction provided by verbal instruction, patient participation, and written materials to support subject.  Instructor addresses importance of mindfulness and meditation practice to help reduce stress and improve awareness.  Instructor also leads participants through a meditation exercise.    Stretching for Flexibility and Mobility:  -Group instruction provided by verbal instruction, patient participation, and written materials to support subject.  Instructors lead participants through series of stretches that are designed to increase flexibility thus improving mobility.  These stretches are additional exercise for major muscle groups that are typically performed during regular warm up and cool down.   Hands Only CPR:  -Group verbal, video, and participation provides a basic overview of AHA guidelines for community CPR. Role-play of emergencies allow participants the opportunity to practice calling for help and chest compression technique with discussion of AED use.   Hypertension: -Group verbal and written instruction that provides a basic overview of hypertension including the most recent diagnostic guidelines, risk factor reduction with self-care instructions and medication management.    Nutrition I class: Heart Healthy Eating:  -Group instruction provided by PowerPoint slides, verbal discussion, and written materials to support subject matter. The instructor gives an explanation and  review of the Therapeutic Lifestyle Changes diet recommendations, which includes a discussion on lipid goals, dietary fat, sodium, fiber, plant stanol/sterol esters, sugar, and the components of a well-balanced, healthy diet.   Nutrition II class: Lifestyle Skills:  -Group instruction provided by PowerPoint slides, verbal discussion, and written materials to support subject matter. The instructor gives an explanation and review of label reading, grocery shopping for heart health, heart healthy recipe modifications, and ways to make healthier choices when eating out.   Diabetes Question & Answer:  -Group instruction provided by PowerPoint slides, verbal discussion, and written materials to support subject matter. The instructor gives an explanation and review of diabetes co-morbidities, pre- and post-prandial blood glucose goals, pre-exercise blood glucose goals, signs, symptoms, and treatment of hypoglycemia and hyperglycemia, and foot care basics.   Diabetes Blitz:  -Group instruction provided by PowerPoint slides, verbal discussion, and written materials to support subject matter. The instructor gives an explanation and review of the physiology behind type 1 and type 2 diabetes, diabetes medications and rational behind using different medications, pre- and post-prandial blood glucose recommendations and Hemoglobin A1c goals, diabetes diet, and exercise including blood glucose guidelines for exercising safely.    Portion Distortion:  -Group instruction provided by PowerPoint slides, verbal discussion, written materials, and food models to support subject matter. The instructor gives an explanation of serving size versus portion size, changes in portions sizes over the last 20 years, and what consists of a serving from each food group.   Stress Management:  -Group instruction provided by verbal instruction, video, and written materials to support subject matter.  Instructors review role of stress  in heart disease and how to cope  with stress positively.     Exercising on Your Own:  -Group instruction provided by verbal instruction, power point, and written materials to support subject.  Instructors discuss benefits of exercise, components of exercise, frequency and intensity of exercise, and end points for exercise.  Also discuss use of nitroglycerin and activating EMS.  Review options of places to exercise outside of rehab.  Review guidelines for sex with heart disease.   Cardiac Drugs I:  -Group instruction provided by verbal instruction and written materials to support subject.  Instructor reviews cardiac drug classes: antiplatelets, anticoagulants, beta blockers, and statins.  Instructor discusses reasons, side effects, and lifestyle considerations for each drug class.   Cardiac Drugs II:  -Group instruction provided by verbal instruction and written materials to support subject.  Instructor reviews cardiac drug classes: angiotensin converting enzyme inhibitors (ACE-I), angiotensin II receptor blockers (ARBs), nitrates, and calcium channel blockers.  Instructor discusses reasons, side effects, and lifestyle considerations for each drug class.   Anatomy and Physiology of the Circulatory System:  Group verbal and written instruction and models provide basic cardiac anatomy and physiology, with the coronary electrical and arterial systems. Review of: AMI, Angina, Valve disease, Heart Failure, Peripheral Artery Disease, Cardiac Arrhythmia, Pacemakers, and the ICD.   Other Education:  -Group or individual verbal, written, or video instructions that support the educational goals of the cardiac rehab program.   Holiday Eating Survival Tips:  -Group instruction provided by PowerPoint slides, verbal discussion, and written materials to support subject matter. The instructor gives patients tips, tricks, and techniques to help them not only survive but enjoy the holidays despite the onslaught  of food that accompanies the holidays.   Knowledge Questionnaire Score: Knowledge Questionnaire Score - 10/29/18 0847    Knowledge Questionnaire Score          Pre Score  22/24           Core Components/Risk Factors/Patient Goals at Admission: Personal Goals and Risk Factors at Admission - 10/29/18 1217    Core Components/Risk Factors/Patient Goals on Admission           Weight Management  Yes;Weight Maintenance    Intervention  Weight Management: Develop a combined nutrition and exercise program designed to reach desired caloric intake, while maintaining appropriate intake of nutrient and fiber, sodium and fats, and appropriate energy expenditure required for the weight goal.;Weight Management: Provide education and appropriate resources to help participant work on and attain dietary goals.    Admit Weight  174 lb 9.7 oz (79.2 kg)    Expected Outcomes  Short Term: Continue to assess and modify interventions until short term weight is achieved;Long Term: Adherence to nutrition and physical activity/exercise program aimed toward attainment of established weight goal;Weight Maintenance: Understanding of the daily nutrition guidelines, which includes 25-35% calories from fat, 7% or less cal from saturated fats, less than '200mg'$  cholesterol, less than 1.5gm of sodium, & 5 or more servings of fruits and vegetables daily;Understanding recommendations for meals to include 15-35% energy as protein, 25-35% energy from fat, 35-60% energy from carbohydrates, less than '200mg'$  of dietary cholesterol, 20-35 gm of total fiber daily;Understanding of distribution of calorie intake throughout the day with the consumption of 4-5 meals/snacks    Heart Failure  Yes    Intervention  Provide a combined exercise and nutrition program that is supplemented with education, support and counseling about heart failure. Directed toward relieving symptoms such as shortness of breath, decreased exercise tolerance, and extremity  edema.  Expected Outcomes  Short term: Attendance in program 2-3 days a week with increased exercise capacity. Reported lower sodium intake. Reported increased fruit and vegetable intake. Reports medication compliance.;Short term: Daily weights obtained and reported for increase. Utilizing diuretic protocols set by physician.;Improve functional capacity of life;Long term: Adoption of self-care skills and reduction of barriers for early signs and symptoms recognition and intervention leading to self-care maintenance.    Hypertension  Yes    Intervention  Provide education on lifestyle modifcations including regular physical activity/exercise, weight management, moderate sodium restriction and increased consumption of fresh fruit, vegetables, and low fat dairy, alcohol moderation, and smoking cessation.;Monitor prescription use compliance.    Expected Outcomes  Short Term: Continued assessment and intervention until BP is < 140/9m HG in hypertensive participants. < 130/871mHG in hypertensive participants with diabetes, heart failure or chronic kidney disease.;Long Term: Maintenance of blood pressure at goal levels.    Lipids  Yes    Intervention  Provide education and support for participant on nutrition & aerobic/resistive exercise along with prescribed medications to achieve LDL '70mg'$ , HDL >'40mg'$ .    Expected Outcomes  Short Term: Participant states understanding of desired cholesterol values and is compliant with medications prescribed. Participant is following exercise prescription and nutrition guidelines.;Long Term: Cholesterol controlled with medications as prescribed, with individualized exercise RX and with personalized nutrition plan. Value goals: LDL < '70mg'$ , HDL > 40 mg.    Stress  Yes    Intervention  Offer individual and/or small group education and counseling on adjustment to heart disease, stress management and health-related lifestyle change. Teach and support self-help strategies.;Refer  participants experiencing significant psychosocial distress to appropriate mental health specialists for further evaluation and treatment. When possible, include family members and significant others in education/counseling sessions.    Expected Outcomes  Long Term: Emotional wellbeing is indicated by absence of clinically significant psychosocial distress or social isolation.;Short Term: Participant demonstrates changes in health-related behavior, relaxation and other stress management skills, ability to obtain effective social support, and compliance with psychotropic medications if prescribed.           Core Components/Risk Factors/Patient Goals Review:  Goals and Risk Factor Review    Core Components/Risk Factors/Patient Goals Review    Row Name 11/02/18 1522   Personal Goals Review  Weight Management/Obesity;Heart Failure;Hypertension;Lipids;Stress   Review  pt with multiple CAD RF demonstrates eagerness to participate in CR program. pt personal goals are to increase muscle strength, specifically arm strength. pt also eager to improve his balance and gain weight.    Expected Outcomes  pt will participate in CR exercise, nutrition and lifestyle modifications to decrease overall RF.           Core Components/Risk Factors/Patient Goals at Discharge (Final Review):  Goals and Risk Factor Review - 11/02/18 1522    Core Components/Risk Factors/Patient Goals Review          Personal Goals Review  Weight Management/Obesity;Heart Failure;Hypertension;Lipids;Stress    Review  pt with multiple CAD RF demonstrates eagerness to participate in CR program. pt personal goals are to increase muscle strength, specifically arm strength. pt also eager to improve his balance and gain weight.     Expected Outcomes  pt will participate in CR exercise, nutrition and lifestyle modifications to decrease overall RF.            ITP Comments: ITP Comments    Row Name 10/29/18 [id number]/24/20 1517 11/07/18  1415   ITP Comments  Dr. TrFransico HimMedical Director  pt started group exercise. pt tolerated light activity without difficulty. pt oriented to exercise equipment and safety routine.    30 day ITP review.       Comments:

## 2018-11-13 ENCOUNTER — Ambulatory Visit (HOSPITAL_COMMUNITY): Payer: Medicare Other

## 2018-11-13 ENCOUNTER — Encounter (HOSPITAL_COMMUNITY)
Admission: RE | Admit: 2018-11-13 | Discharge: 2018-11-13 | Disposition: A | Discharge status: Home / Self Care | Payer: Medicare Other | Source: Ambulatory Visit | Attending: Cardiovascular Disease | Admitting: Cardiovascular Disease

## 2018-11-13 DIAGNOSIS — I2102 ST elevation (STEMI) myocardial infarction involving left anterior descending coronary artery: Secondary | ICD-10-CM

## 2018-11-13 DIAGNOSIS — Z955 Presence of coronary angioplasty implant and graft: Secondary | ICD-10-CM

## 2018-11-16 ENCOUNTER — Ambulatory Visit (HOSPITAL_COMMUNITY): Payer: Medicare Other

## 2018-11-16 ENCOUNTER — Encounter (HOSPITAL_COMMUNITY)
Admission: RE | Admit: 2018-11-16 | Discharge: 2018-11-16 | Disposition: A | Discharge status: Home / Self Care | Payer: Medicare Other | Source: Ambulatory Visit | Attending: Cardiovascular Disease | Admitting: Cardiovascular Disease

## 2018-11-16 DIAGNOSIS — I2102 ST elevation (STEMI) myocardial infarction involving left anterior descending coronary artery: Secondary | ICD-10-CM

## 2018-11-16 DIAGNOSIS — Z955 Presence of coronary angioplasty implant and graft: Secondary | ICD-10-CM

## 2018-11-18 ENCOUNTER — Other Ambulatory Visit: Payer: Self-pay

## 2018-11-18 ENCOUNTER — Ambulatory Visit (HOSPITAL_COMMUNITY): Payer: Medicare Other

## 2018-11-18 ENCOUNTER — Encounter (HOSPITAL_COMMUNITY)
Admission: RE | Admit: 2018-11-18 | Discharge: 2018-11-18 | Disposition: A | Discharge status: Home / Self Care | Payer: Medicare Other | Source: Ambulatory Visit | Attending: Cardiovascular Disease | Admitting: Cardiovascular Disease

## 2018-11-18 DIAGNOSIS — D485 Neoplasm of uncertain behavior of skin: Secondary | ICD-10-CM | POA: Diagnosis not present

## 2018-11-18 DIAGNOSIS — C44729 Squamous cell carcinoma of skin of left lower limb, including hip: Secondary | ICD-10-CM | POA: Diagnosis not present

## 2018-11-18 DIAGNOSIS — Z955 Presence of coronary angioplasty implant and graft: Secondary | ICD-10-CM

## 2018-11-18 DIAGNOSIS — I2102 ST elevation (STEMI) myocardial infarction involving left anterior descending coronary artery: Secondary | ICD-10-CM

## 2018-11-18 DIAGNOSIS — C44722 Squamous cell carcinoma of skin of right lower limb, including hip: Secondary | ICD-10-CM | POA: Diagnosis not present

## 2018-11-18 NOTE — Progress Notes (Signed)
I have reviewed a Home Exercise Prescription with Awilda Metro . Daved is currently exercising at home.  The patient was advised to walk 5-7 days a week for 30-45 minutes.  Ferd and I discussed how to progress their exercise prescription.  The patient stated that their goals were to continue to walk at home one hour per day in addition to Cardiac Rehab.  The patient stated that they understand the exercise prescription.  We reviewed exercise guidelines, target heart rate during exercise, RPE Scale, weather conditions, NTG use, endpoints for exercise, warmup and cool down.  Patient is encouraged to come to me with any questions. I will continue to follow up with the patient to assist them with progression and safety.    11/18/2018 2:40 PM  Deitra Mayo BS, ACSM CEP

## 2018-11-20 ENCOUNTER — Encounter (HOSPITAL_COMMUNITY)
Admission: RE | Admit: 2018-11-20 | Discharge: 2018-11-20 | Disposition: A | Discharge status: Home / Self Care | Payer: Medicare Other | Source: Ambulatory Visit | Attending: Cardiovascular Disease | Admitting: Cardiovascular Disease

## 2018-11-20 ENCOUNTER — Ambulatory Visit (HOSPITAL_COMMUNITY): Payer: Medicare Other

## 2018-11-20 ENCOUNTER — Other Ambulatory Visit: Payer: Self-pay

## 2018-11-20 DIAGNOSIS — Z955 Presence of coronary angioplasty implant and graft: Secondary | ICD-10-CM

## 2018-11-20 DIAGNOSIS — I2102 ST elevation (STEMI) myocardial infarction involving left anterior descending coronary artery: Secondary | ICD-10-CM | POA: Diagnosis not present

## 2018-11-23 ENCOUNTER — Ambulatory Visit (HOSPITAL_COMMUNITY): Payer: Medicare Other

## 2018-11-23 ENCOUNTER — Encounter (HOSPITAL_COMMUNITY): Payer: Medicare Other

## 2018-11-23 ENCOUNTER — Telehealth (HOSPITAL_COMMUNITY): Payer: Self-pay | Admitting: Cardiac Rehabilitation

## 2018-11-23 NOTE — Telephone Encounter (Signed)
Phone call to notify Pt of CR Phase II departmental closing for 2 weeks.  Pt verbalized understanding.  Andi Hence, RN, BSN Cardiac Pulmonary Rehab

## 2018-11-25 ENCOUNTER — Ambulatory Visit (HOSPITAL_COMMUNITY): Payer: Medicare Other

## 2018-11-25 ENCOUNTER — Encounter (HOSPITAL_COMMUNITY): Payer: Medicare Other

## 2018-11-25 DIAGNOSIS — C44722 Squamous cell carcinoma of skin of right lower limb, including hip: Secondary | ICD-10-CM | POA: Diagnosis not present

## 2018-11-26 DIAGNOSIS — R339 Retention of urine, unspecified: Secondary | ICD-10-CM | POA: Diagnosis not present

## 2018-11-27 ENCOUNTER — Encounter (HOSPITAL_COMMUNITY): Payer: Medicare Other

## 2018-11-27 ENCOUNTER — Ambulatory Visit (HOSPITAL_COMMUNITY): Payer: Medicare Other

## 2018-11-30 ENCOUNTER — Ambulatory Visit (HOSPITAL_COMMUNITY): Payer: Medicare Other

## 2018-11-30 ENCOUNTER — Telehealth (HOSPITAL_COMMUNITY): Payer: Self-pay | Admitting: Cardiac Rehabilitation

## 2018-11-30 ENCOUNTER — Encounter (HOSPITAL_COMMUNITY): Payer: Medicare Other

## 2018-11-30 NOTE — Telephone Encounter (Signed)
Phone call to pt to advise of Outpatient Cardiac Rehab departmental closing for Covid 19 precautions. Departmental closing for additional four weeks.  No answer, no voicemail.  Andi Hence, RN, BSN Cardiac Pulmonary Rehab

## 2018-12-01 ENCOUNTER — Telehealth (HOSPITAL_COMMUNITY): Payer: Self-pay | Admitting: Cardiac Rehabilitation

## 2018-12-01 ENCOUNTER — Encounter (HOSPITAL_COMMUNITY): Payer: Self-pay | Admitting: Cardiac Rehabilitation

## 2018-12-01 ENCOUNTER — Telehealth: Payer: Self-pay | Admitting: Physician Assistant

## 2018-12-01 DIAGNOSIS — I2102 ST elevation (STEMI) myocardial infarction involving left anterior descending coronary artery: Secondary | ICD-10-CM

## 2018-12-01 DIAGNOSIS — Z955 Presence of coronary angioplasty implant and graft: Secondary | ICD-10-CM

## 2018-12-01 NOTE — Progress Notes (Signed)
Cardiac Individual Treatment Plan  Patient Details  Name: James [name] MRN: [id number] Date of Birth: 11/22/1940 Referring Provider:   Flowsheet Row CARDIAC REHAB PHASE II ORIENTATION from 10/29/2018 in [place]  Referring Provider  Dr. Burt Knack       Initial Encounter Date:  Flowsheet Row CARDIAC REHAB PHASE II ORIENTATION from 10/29/2018 in [place]  Date  10/29/18      Visit Diagnosis: ST elevation myocardial infarction involving left anterior descending (LAD) coronary artery (HCC)n12/14/19  Status post coronary artery stent placement 09/04/18 DES LAD  Patient's Home Medications on Admission:  Current Outpatient Medications:  .  aspirin EC 81 MG EC tablet, Take 1 tablet (81 mg total) by mouth daily., Disp: 90 tablet, Rfl: 3 .  atorvastatin (LIPITOR) 80 MG tablet, Take 1 tablet (80 mg total) by mouth daily at 6 PM., Disp: 30 tablet, Rfl: 11 .  carvedilol (COREG) 3.125 MG tablet, Take 1 tablet (3.125 mg total) by mouth 2 (two) times daily with a meal., Disp: 60 tablet, Rfl: 11 .  lisinopril (PRINIVIL,ZESTRIL) 2.5 MG tablet, Take 1 tablet (2.5 mg total) by mouth daily., Disp: 30 tablet, Rfl: 11 .  Multiple Vitamin (MULTIVITAMIN WITH MINERALS) TABS tablet, Take 1 tablet by mouth daily., Disp: , Rfl:  .  nitroGLYCERIN (NITROSTAT) 0.4 MG SL tablet, Place 1 tablet (0.4 mg total) under the tongue every 5 (five) minutes x 3 doses as needed for chest pain., Disp: 25 tablet, Rfl: 6 .  Omega-3 Fatty Acids (FISH OIL) 1000 MG CAPS, Take 1,000 mg by mouth at bedtime. , Disp: , Rfl:  .  omeprazole (PRILOSEC) 20 MG capsule, Take 20 mg by mouth daily., Disp: , Rfl:  .  tamsulosin (FLOMAX) 0.4 MG CAPS capsule, Take 0.4 mg by mouth 2 (two) times daily. , Disp: , Rfl:  .  ticagrelor (BRILINTA) 90 MG TABS tablet, Take 1 tablet (90 mg total) by mouth 2 (two) times daily., Disp: 60 tablet, Rfl: 11  Past Medical History: Past Medical  History:  Diagnosis Date  . BPH (benign prostatic hypertrophy)   . CAD (coronary artery disease), native coronary artery    a. Cath 08/22/18 for acute anteroseptal MI s/p DES to mLAD  . Chronic combined systolic and diastolic heart failure ([place]) 09/04/2018   Ischemic CM // Ant-sept MI 12/19 tx with DES to pLAD  . Hyperlipidemia   . Kidney stone   . Prostatitis   . Skin cancer   . Trigger finger     Tobacco Use: Social History   Tobacco Use  Smoking Status Never Smoker  Smokeless Tobacco Never Used    Labs: Recent Review Flowsheet Data    Labs for ITP Cardiac and Pulmonary Rehab Latest Ref Rng & Units 08/22/2018 08/23/2018 10/27/2018   Cholestrol 100 - 199 mg/dL 156 138 112   LDLCALC 0 - 99 mg/dL 93 85 60   HDL >39 mg/dL 43 37(L) 38(L)   Trlycerides 0 - 149 mg/dL 101 82 69   Hemoglobin A1c 4.8 - 5.6 % 5.4 - -      Capillary Blood Glucose: No results found for: GLUCAP   Exercise Target Goals: Exercise Program Goal: Individual exercise prescription set using results from initial 6 min walk test and THRR while considering  patient's activity barriers and safety.   Exercise Prescription Goal: Initial exercise prescription builds to 30-45 minutes a day of aerobic activity, 2-3 days per week.  Home exercise guidelines will  be given to patient during program as part of exercise prescription that the participant will acknowledge.  Activity Barriers & Risk Stratification: Activity Barriers & Cardiac Risk Stratification - 10/29/18 1049    Activity Barriers & Cardiac Risk Stratification          Activity Barriers  Muscular Weakness;Deconditioning    Cardiac Risk Stratification  High           6 Minute Walk: 6 Minute Walk    6 Minute Walk    Row Name 10/29/18 1048   Phase  Initial   Distance  1613 feet   Walk Time  6 minutes   # of Rest Breaks  0   MPH  3.1   METS  3.02   RPE  11   VO2 Peak  10.57   Symptoms  No   Resting HR  83 bpm   Resting BP  102/64    Resting Oxygen Saturation   97 %   Exercise Oxygen Saturation  during 6 min walk  97 %   Max Ex. HR  92 bpm   Max Ex. BP  122/60   2 Minute Post BP  104/70          Oxygen Initial Assessment:   Oxygen Re-Evaluation:   Oxygen Discharge (Final Oxygen Re-Evaluation):   Initial Exercise Prescription: Initial Exercise Prescription - 10/29/18 1100    Date of Initial Exercise RX and Referring Provider          Date  10/29/18    Referring Provider  Dr. Burt Knack     Expected Discharge Date  02/05/19        Recumbant Bike          Level  1.5    Watts  30    Minutes  10    METs  3.18        NuStep          Level  2    SPM  75    Minutes  10    METs  3        Track          Laps  12    Minutes  10    METs  3.07        Prescription Details          Frequency (times per week)  3    Duration  Progress to 30 minutes of continuous aerobic without signs/symptoms of physical distress        Intensity          THRR 40-80% of Max Heartrate  58-115    Ratings of Perceived Exertion  11-13        Progression          Progression  Continue to progress workloads to maintain intensity without signs/symptoms of physical distress.        Resistance Training          Training Prescription  Yes    Weight  3 lbs.     Reps  10-15           Perform Capillary Blood Glucose checks as needed.  Exercise Prescription Changes: Exercise Prescription Changes    Response to Exercise    Row Name 11/02/18 1500 11/18/18 1400 11/20/18 0843   Blood Pressure (Admit)  120/70  no documentation  112/60   Blood Pressure (Exercise)  154/80  no documentation  150/60   Blood Pressure (Exit)  108/58  no  documentation  112/70   Heart Rate (Admit)  69 bpm  no documentation  77 bpm   Heart Rate (Exercise)  105 bpm  no documentation  109 bpm   Heart Rate (Exit)  79 bpm  no documentation  76 bpm   Rating of Perceived Exertion (Exercise)  13  no documentation  12   Symptoms  None  no  documentation  None   Comments  Pt first day of exercise.   no documentation  no documentation   Duration  Continue with 30 min of aerobic exercise without signs/symptoms of physical distress.  no documentation  Continue with 30 min of aerobic exercise without signs/symptoms of physical distress.   Intensity  THRR unchanged  no documentation  THRR unchanged       Progression    Row Name 11/02/18 1500 11/18/18 1400 11/20/18 0843   Progression  Continue to progress workloads to maintain intensity without signs/symptoms of physical distress.  no documentation  Continue to progress workloads to maintain intensity without signs/symptoms of physical distress.   Average METs  2.9  no documentation  4.2       Resistance Training    Row Name 11/02/18 1500 11/18/18 1400 11/20/18 0843   Training Prescription  Yes  no documentation  Yes   Weight  3 lbs.   no documentation  4 lbs.    Reps  10-15  no documentation  10-15   Time  10 Minutes  no documentation  [place] Name 11/02/18 1500 11/18/18 1400 11/20/18 0843   Level  1.5  no documentation  2   Watts  22  no documentation  60   Minutes  10  no documentation  10   METs  2.79  no documentation  4.36       NuStep    Row Name 11/02/18 1500 11/18/18 1400 11/20/18 0843   Level  2  no documentation  4   SPM  75  no documentation  85   Minutes  10  no documentation  10   METs  3  no documentation  5       Track    Row Name 11/02/18 1500 11/18/18 1400 11/20/18 0843   Laps  16  no documentation  16   Minutes  10  no documentation  10   METs  3.09  no documentation  3.09       Home Exercise Plan    [place] Name 11/02/18 1500 11/18/18 1400 11/20/18 0843   Plans to continue exercise at  no documentation  Home (comment)  Home (comment)   Frequency  no documentation  Add 4 additional days to program exercise sessions.  Add 4 additional days to program exercise sessions.   Initial Home Exercises Provided  no  documentation  11/18/18  11/18/18          Exercise Comments: Exercise Comments    Row Name 11/02/18 1504 11/10/18 1107 11/18/18 1441 11/27/18 0845   Exercise Comments  Pt first day of exercise. Pt tolerated exercsie well.   Reviewed METs and goals with Pt. Pt understands goals and is exercising at home in addition to Cardiac Rehab.   Reviewed HEP with Pt. Pt was responsive and understands goals.   Unable to review METs and goals with Pt due to closure of Cardiac Rehab for [place].       Exercise Goals and Review: Exercise Goals  Exercise Goals    Row Name 10/29/18 1049   Increase Physical Activity  Yes   Intervention  Provide advice, education, support and counseling about physical activity/exercise needs.;Develop an individualized exercise prescription for aerobic and resistive training based on initial evaluation findings, risk stratification, comorbidities and participant's personal goals.   Expected Outcomes  Short Term: Attend rehab on a regular basis to increase amount of physical activity.   Increase Strength and Stamina  Yes   Intervention  Provide advice, education, support and counseling about physical activity/exercise needs.;Develop an individualized exercise prescription for aerobic and resistive training based on initial evaluation findings, risk stratification, comorbidities and participant's personal goals.   Expected Outcomes  Short Term: Increase workloads from initial exercise prescription for resistance, speed, and METs.   Able to understand and use rate of perceived exertion (RPE) scale  Yes   Intervention  Provide education and explanation on how to use RPE scale   Expected Outcomes  Short Term: Able to use RPE daily in rehab to express subjective intensity level;Long Term:  Able to use RPE to guide intensity level when exercising independently   Knowledge and understanding of Target Heart Rate Range (THRR)  Yes   Intervention  Provide education and explanation of  THRR including how the numbers were predicted and where they are located for reference   Expected Outcomes  Long Term: Able to use THRR to govern intensity when exercising independently;Short Term: Able to state/look up THRR;Short Term: Able to use daily as guideline for intensity in rehab   Able to check pulse independently  Yes   Intervention  Provide education and demonstration on how to check pulse in carotid and radial arteries.;Review the importance of being able to check your own pulse for safety during independent exercise   Expected Outcomes  Short Term: Able to explain why pulse checking is important during independent exercise;Long Term: Able to check pulse independently and accurately   Understanding of Exercise Prescription  Yes   Intervention  Provide education, explanation, and written materials on patient's individual exercise prescription   Expected Outcomes  Short Term: Able to explain program exercise prescription;Long Term: Able to explain home exercise prescription to exercise independently          Exercise Goals Re-Evaluation : Exercise Goals Re-Evaluation    Exercise Goal Re-Evaluation    Row Name 11/02/18 1502 11/10/18 1106 11/18/18 1440 11/27/18 0845   Exercise Goals Review  Increase Physical Activity;Increase Strength and Stamina;Able to understand and use rate of perceived exertion (RPE) scale;Knowledge and understanding of Target Heart Rate Range (THRR);Understanding of Exercise Prescription  Increase Physical Activity;Increase Strength and Stamina;Able to understand and use rate of perceived exertion (RPE) scale;Knowledge and understanding of Target Heart Rate Range (THRR);Understanding of Exercise Prescription  Increase Physical Activity;Increase Strength and Stamina;Able to understand and use rate of perceived exertion (RPE) scale;Knowledge and understanding of Target Heart Rate Range (THRR);Understanding of Exercise Prescription;Able to check pulse independently   Increase Physical Activity;Increase Strength and Stamina;Able to understand and use rate of perceived exertion (RPE) scale;Knowledge and understanding of Target Heart Rate Range (THRR);Understanding of Exercise Prescription;Able to check pulse independently   Comments  Pt first day of Cardiac Rehab. Pt tolerated exercise well and understands RPE scale, THRR, and workloads. Will increase workloads as Pt tolerates.   Reviewed METs and goals with Pt. Pt MET level is 2.9. Pt has been progressing well and increasing workloads. Pt is walking 5 days a week for 1 hour in addition to Cardiac  Rehab.   Reviewed HEP with Pt. Pt was responsive and understands THRR, RPE, weather precautions, end points of exercise, and warm up and cool down exercises. Pt is walking at home 5-7 days per week for one hour in addition to Cardiac Rehab.   Unable to review METs and goals with Pt due to closure of Cardiac Rehab for [place]. Pt MET level is 4.2. Pt is progressing well.    Expected Outcomes  Will continue to monitor and progress Pt as tolerated.   Will continue to monitor and progress Pt as tolerated.   Will continue to monitor and progress Pt as tolerated.   Will continue to monitor and progress Pt as tolerated.           Discharge Exercise Prescription (Final Exercise Prescription Changes): Exercise Prescription Changes - 11/20/18 0843    Response to Exercise          Blood Pressure (Admit)  112/60    Blood Pressure (Exercise)  150/60    Blood Pressure (Exit)  112/70    Heart Rate (Admit)  77 bpm    Heart Rate (Exercise)  109 bpm    Heart Rate (Exit)  76 bpm    Rating of Perceived Exertion (Exercise)  12    Symptoms  None    Duration  Continue with 30 min of aerobic exercise without signs/symptoms of physical distress.    Intensity  THRR unchanged        Progression          Progression  Continue to progress workloads to maintain intensity without signs/symptoms of physical distress.    Average METs  4.2         Resistance Training          Training Prescription  Yes    Weight  4 lbs.     Reps  10-15    Time  10 Minutes        Recumbant Bike          Level  2    Watts  60    Minutes  10    METs  4.36        NuStep          Level  4    SPM  85    Minutes  10    METs  5        Track          Laps  16    Minutes  10    METs  3.09        Home Exercise Plan          Plans to continue exercise at  Home (comment)    Frequency  Add 4 additional days to program exercise sessions.    Initial Home Exercises Provided  11/18/18           Nutrition:  Target Goals: Understanding of nutrition guidelines, daily intake of sodium <1538m, cholesterol <2080m calories 30% from fat and 7% or less from saturated fats, daily to have 5 or more servings of fruits and vegetables.  Biometrics: Pre Biometrics - 10/29/18 1050    Pre Biometrics          Height  5' 7.5" (1.715 m)    Weight  174 lb 9.7 oz (79.2 kg)    Waist Circumference  38.5 inches    Hip Circumference  39 inches    Waist to Hip Ratio  0.99 %    BMI (  Calculated)  26.93    Triceps Skinfold  10 mm    % Body Fat  25.3 %    Grip Strength  30 kg    Flexibility  14 in    Single Leg Stand  7.72 seconds            Nutrition Therapy Plan and Nutrition Goals: Nutrition Therapy & Goals - 10/29/18 1507    Nutrition Therapy          Diet  heart healthy        Personal Nutrition Goals          Nutrition Goal  Pt to identify and limit food sources of saturated fat, trans fat, refined carbohydrates and sodium     Personal Goal #2  Pt to identify food quantities necessary to achieve weight maintenance/ weight gain of 5-10 lb at graduation from cardiac rehab     Personal Goal #3  Pt to build a healthy plate including vegetables, fruits, whole grains, and low-fat dairy products in a heart healthy meal plan        Intervention Plan          Intervention  Prescribe, educate and counsel regarding individualized specific  dietary modifications aiming towards targeted core components such as weight, hypertension, lipid management, diabetes, heart failure and other comorbidities.    Expected Outcomes  Short Term Goal: Understand basic principles of dietary content, such as calories, fat, sodium, cholesterol and nutrients.;Long Term Goal: Adherence to prescribed nutrition plan.           Nutrition Assessments: Nutrition Assessments - 10/29/18 1507    MEDFICTS Scores          Pre Score  30           Nutrition Goals Re-Evaluation: Nutrition Goals Re-Evaluation    Goals    Row Name 10/29/18 1507   Current Weight  174 lb 9.7 oz (79.2 kg)          Nutrition Goals Re-Evaluation: Nutrition Goals Re-Evaluation    Goals    Row Name 10/29/18 1507   Current Weight  174 lb 9.7 oz (79.2 kg)          Nutrition Goals Discharge (Final Nutrition Goals Re-Evaluation): Nutrition Goals Re-Evaluation - 10/29/18 1507    Goals          Current Weight  174 lb 9.7 oz (79.2 kg)           Psychosocial: Target Goals: Acknowledge presence or absence of significant depression and/or stress, maximize coping skills, provide positive support system. Participant is able to verbalize types and ability to use techniques and skills needed for reducing stress and depression.  Initial Review & Psychosocial Screening: Initial Psych Review & Screening - 10/29/18 0846    Initial Review          Current issues with  None Identified        Family Dynamics          Good Support System?  Yes   Pt lists his spouse as a source of support.        Barriers          Psychosocial barriers to participate in program  There are no identifiable barriers or psychosocial needs.        Screening Interventions          Interventions  Encouraged to exercise           Quality of Life Scores: Quality of Life -  10/29/18 0847    Quality of Life          Select  Quality of Life        Quality of Life Scores           Health/Function Pre  29.6 %    Socioeconomic Pre  28 %    Psych/Spiritual Pre  30 %    Family Pre  30 %    GLOBAL Pre  29.45 %          Scores of 19 and below usually indicate a poorer quality of life in these areas.  A difference of  2-3 points is a clinically meaningful difference.  A difference of 2-3 points in the total score of the Quality of Life Index has been associated with significant improvement in overall quality of life, self-image, physical symptoms, and general health in studies assessing change in quality of life.  PHQ-9: Recent Review Flowsheet Data    There is no flowsheet data to display.     Interpretation of Total Score  Total Score Depression Severity:  1-4 = Minimal depression, 5-9 = Mild depression, 10-14 = Moderate depression, 15-19 = Moderately severe depression, 20-27 = Severe depression   Psychosocial Evaluation and Intervention: Psychosocial Evaluation - 11/02/18 1519    Psychosocial Evaluation & Interventions          Interventions  Encouraged to exercise with the program and follow exercise prescription    Comments  no psychosocial needs identified, no interventions necessary. pt enjoys fishing.     Expected Outcomes  pt will exhibit positive outlook with good coping skills.      Continue Psychosocial Services   No Follow up required           Psychosocial Re-Evaluation: Psychosocial Re-Evaluation    Psychosocial Re-Evaluation    Row Name 11/25/18 1200   Current issues with  None Identified   Comments  no psychosocial needs identified, no interventions necessary    Expected Outcomes  pt will exhibit positive outlook with good coping skills.    Interventions  Stress management education;Encouraged to attend Cardiac Rehabilitation for the exercise;Relaxation education   Continue Psychosocial Services   No Follow up required          Psychosocial Discharge (Final Psychosocial Re-Evaluation): Psychosocial Re-Evaluation - 11/25/18 1200     Psychosocial Re-Evaluation          Current issues with  None Identified    Comments  no psychosocial needs identified, no interventions necessary     Expected Outcomes  pt will exhibit positive outlook with good coping skills.     Interventions  Stress management education;Encouraged to attend Cardiac Rehabilitation for the exercise;Relaxation education    Continue Psychosocial Services   No Follow up required           Vocational Rehabilitation: Provide vocational rehab assistance to qualifying candidates.   Vocational Rehab Evaluation & Intervention: Vocational Rehab - 10/29/18 0847    Initial Vocational Rehab Evaluation & Intervention          Assessment shows need for Vocational Rehabilitation  No           Education: Education Goals: Education classes will be provided on a weekly basis, covering required topics. Participant will state understanding/return demonstration of topics presented.  Learning Barriers/Preferences: Learning Barriers/Preferences - 10/29/18 1052    Learning Barriers/Preferences          Learning Barriers  None    Learning Preferences  Video;Skilled Demonstration;Pictoral  Education Topics: Count Your Pulse:  -Group instruction provided by verbal instruction, demonstration, patient participation and written materials to support subject.  Instructors address importance of being able to find your pulse and how to count your pulse when at home without a heart monitor.  Patients get hands on experience counting their pulse with staff help and individually.   Heart Attack, Angina, and Risk Factor Modification:  -Group instruction provided by verbal instruction, video, and written materials to support subject.  Instructors address signs and symptoms of angina and heart attacks.    Also discuss risk factors for heart disease and how to make changes to improve heart health risk factors.   Functional Fitness:  -Group instruction provided by  verbal instruction, demonstration, patient participation, and written materials to support subject.  Instructors address safety measures for doing things around the house.  Discuss how to get up and down off the floor, how to pick things up properly, how to safely get out of a chair without assistance, and balance training.   Meditation and Mindfulness:  -Group instruction provided by verbal instruction, patient participation, and written materials to support subject.  Instructor addresses importance of mindfulness and meditation practice to help reduce stress and improve awareness.  Instructor also leads participants through a meditation exercise.    Stretching for Flexibility and Mobility:  -Group instruction provided by verbal instruction, patient participation, and written materials to support subject.  Instructors lead participants through series of stretches that are designed to increase flexibility thus improving mobility.  These stretches are additional exercise for major muscle groups that are typically performed during regular warm up and cool down.   Hands Only CPR:  -Group verbal, video, and participation provides a basic overview of AHA guidelines for community CPR. Role-play of emergencies allow participants the opportunity to practice calling for help and chest compression technique with discussion of AED use.   Hypertension: -Group verbal and written instruction that provides a basic overview of hypertension including the most recent diagnostic guidelines, risk factor reduction with self-care instructions and medication management.    Nutrition I class: Heart Healthy Eating:  -Group instruction provided by PowerPoint slides, verbal discussion, and written materials to support subject matter. The instructor gives an explanation and review of the Therapeutic Lifestyle Changes diet recommendations, which includes a discussion on lipid goals, dietary fat, sodium, fiber, plant  stanol/sterol esters, sugar, and the components of a well-balanced, healthy diet.   Nutrition II class: Lifestyle Skills:  -Group instruction provided by PowerPoint slides, verbal discussion, and written materials to support subject matter. The instructor gives an explanation and review of label reading, grocery shopping for heart health, heart healthy recipe modifications, and ways to make healthier choices when eating out.   Diabetes Question & Answer:  -Group instruction provided by PowerPoint slides, verbal discussion, and written materials to support subject matter. The instructor gives an explanation and review of diabetes co-morbidities, pre- and post-prandial blood glucose goals, pre-exercise blood glucose goals, signs, symptoms, and treatment of hypoglycemia and hyperglycemia, and foot care basics.   Diabetes Blitz:  -Group instruction provided by PowerPoint slides, verbal discussion, and written materials to support subject matter. The instructor gives an explanation and review of the physiology behind type 1 and type 2 diabetes, diabetes medications and rational behind using different medications, pre- and post-prandial blood glucose recommendations and Hemoglobin A1c goals, diabetes diet, and exercise including blood glucose guidelines for exercising safely.    Portion Distortion:  -Group instruction provided by PowerPoint slides,  verbal discussion, written materials, and food models to support subject matter. The instructor gives an explanation of serving size versus portion size, changes in portions sizes over the last 20 years, and what consists of a serving from each food group.   Stress Management:  -Group instruction provided by verbal instruction, video, and written materials to support subject matter.  Instructors review role of stress in heart disease and how to cope with stress positively.     Exercising on Your Own:  -Group instruction provided by verbal instruction,  power point, and written materials to support subject.  Instructors discuss benefits of exercise, components of exercise, frequency and intensity of exercise, and end points for exercise.  Also discuss use of nitroglycerin and activating EMS.  Review options of places to exercise outside of rehab.  Review guidelines for sex with heart disease.   Cardiac Drugs I:  -Group instruction provided by verbal instruction and written materials to support subject.  Instructor reviews cardiac drug classes: antiplatelets, anticoagulants, beta blockers, and statins.  Instructor discusses reasons, side effects, and lifestyle considerations for each drug class.   Cardiac Drugs II:  -Group instruction provided by verbal instruction and written materials to support subject.  Instructor reviews cardiac drug classes: angiotensin converting enzyme inhibitors (ACE-I), angiotensin II receptor blockers (ARBs), nitrates, and calcium channel blockers.  Instructor discusses reasons, side effects, and lifestyle considerations for each drug class.   Anatomy and Physiology of the Circulatory System:  Group verbal and written instruction and models provide basic cardiac anatomy and physiology, with the coronary electrical and arterial systems. Review of: AMI, Angina, Valve disease, Heart Failure, Peripheral Artery Disease, Cardiac Arrhythmia, Pacemakers, and the ICD.   Other Education:  -Group or individual verbal, written, or video instructions that support the educational goals of the cardiac rehab program.   Holiday Eating Survival Tips:  -Group instruction provided by PowerPoint slides, verbal discussion, and written materials to support subject matter. The instructor gives patients tips, tricks, and techniques to help them not only survive but enjoy the holidays despite the onslaught of food that accompanies the holidays.   Knowledge Questionnaire Score: Knowledge Questionnaire Score - 10/29/18 0847    Knowledge  Questionnaire Score          Pre Score  22/24           Core Components/Risk Factors/Patient Goals at Admission: Personal Goals and Risk Factors at Admission - 10/29/18 1217    Core Components/Risk Factors/Patient Goals on Admission           Weight Management  Yes;Weight Maintenance    Intervention  Weight Management: Develop a combined nutrition and exercise program designed to reach desired caloric intake, while maintaining appropriate intake of nutrient and fiber, sodium and fats, and appropriate energy expenditure required for the weight goal.;Weight Management: Provide education and appropriate resources to help participant work on and attain dietary goals.    Admit Weight  174 lb 9.7 oz (79.2 kg)    Expected Outcomes  Short Term: Continue to assess and modify interventions until short term weight is achieved;Long Term: Adherence to nutrition and physical activity/exercise program aimed toward attainment of established weight goal;Weight Maintenance: Understanding of the daily nutrition guidelines, which includes 25-35% calories from fat, 7% or less cal from saturated fats, less than 229m cholesterol, less than 1.5gm of sodium, & 5 or more servings of fruits and vegetables daily;Understanding recommendations for meals to include 15-35% energy as protein, 25-35% energy from fat, 35-60% energy from carbohydrates, less  than 239m of dietary cholesterol, 20-35 gm of total fiber daily;Understanding of distribution of calorie intake throughout the day with the consumption of 4-5 meals/snacks    Heart Failure  Yes    Intervention  Provide a combined exercise and nutrition program that is supplemented with education, support and counseling about heart failure. Directed toward relieving symptoms such as shortness of breath, decreased exercise tolerance, and extremity edema.    Expected Outcomes  Short term: Attendance in program 2-3 days a week with increased exercise capacity. Reported lower sodium  intake. Reported increased fruit and vegetable intake. Reports medication compliance.;Short term: Daily weights obtained and reported for increase. Utilizing diuretic protocols set by physician.;Improve functional capacity of life;Long term: Adoption of self-care skills and reduction of barriers for early signs and symptoms recognition and intervention leading to self-care maintenance.    Hypertension  Yes    Intervention  Provide education on lifestyle modifcations including regular physical activity/exercise, weight management, moderate sodium restriction and increased consumption of fresh fruit, vegetables, and low fat dairy, alcohol moderation, and smoking cessation.;Monitor prescription use compliance.    Expected Outcomes  Short Term: Continued assessment and intervention until BP is < 140/942mHG in hypertensive participants. < 130/804mG in hypertensive participants with diabetes, heart failure or chronic kidney disease.;Long Term: Maintenance of blood pressure at goal levels.    Lipids  Yes    Intervention  Provide education and support for participant on nutrition & aerobic/resistive exercise along with prescribed medications to achieve LDL <51m75mDL >40mg46m Expected Outcomes  Short Term: Participant states understanding of desired cholesterol values and is compliant with medications prescribed. Participant is following exercise prescription and nutrition guidelines.;Long Term: Cholesterol controlled with medications as prescribed, with individualized exercise RX and with personalized nutrition plan. Value goals: LDL < 51mg,18m > 40 mg.    Stress  Yes    Intervention  Offer individual and/or small group education and counseling on adjustment to heart disease, stress management and health-related lifestyle change. Teach and support self-help strategies.;Refer participants experiencing significant psychosocial distress to appropriate mental health specialists for further evaluation and  treatment. When possible, include family members and significant others in education/counseling sessions.    Expected Outcomes  Long Term: Emotional wellbeing is indicated by absence of clinically significant psychosocial distress or social isolation.;Short Term: Participant demonstrates changes in health-related behavior, relaxation and other stress management skills, ability to obtain effective social support, and compliance with psychotropic medications if prescribed.           Core Components/Risk Factors/Patient Goals Review:  Goals and Risk Factor Review    Core Components/Risk Factors/Patient Goals Review    Row Name 11/02/18 1522 11/25/18 1200   Personal Goals Review  Weight Management/Obesity;Heart Failure;Hypertension;Lipids;Stress  Weight Management/Obesity;Heart Failure;Hypertension;Lipids;Stress   Review  pt with multiple CAD RF demonstrates eagerness to participate in CR program. pt personal goals are to increase muscle strength, specifically arm strength. pt also eager to improve his balance and gain weight.   pt with multiple CAD RF demonstrates eagerness to participate in CR program. pt personal goals are to increase muscle strength, specifically arm strength. pt also eager to improve his balance and gain weight. pt currently on hold for departmental closing for COVID 19 precautions.     Expected Outcomes  pt will participate in CR exercise, nutrition and lifestyle modifications to decrease overall RF.   pt will participate in CR exercise, nutrition and lifestyle modifications to decrease overall RF.  Core Components/Risk Factors/Patient Goals at Discharge (Final Review):  Goals and Risk Factor Review - 11/25/18 1200    Core Components/Risk Factors/Patient Goals Review          Personal Goals Review  Weight Management/Obesity;Heart Failure;Hypertension;Lipids;Stress    Review  pt with multiple CAD RF demonstrates eagerness to participate in CR program. pt personal  goals are to increase muscle strength, specifically arm strength. pt also eager to improve his balance and gain weight. pt currently on hold for departmental closing for COVID 19 precautions.      Expected Outcomes  pt will participate in CR exercise, nutrition and lifestyle modifications to decrease overall RF.            ITP Comments: ITP Comments    Row Name 10/29/18 0851 11/02/18 1517 11/07/18 1415 11/25/18 1159   ITP Comments  Dr. Fransico Him, Medical Director  pt started group exercise. pt tolerated light activity without difficulty. pt oriented to exercise equipment and safety routine.    30 day ITP review.   30 day ITP review.  pt currently on hold for departmental closing for COVID 19 precautions.       Comments:  Andi Hence, RN, BSN Cardiac Pulmonary Rehab

## 2018-12-01 NOTE — Telephone Encounter (Signed)
Phone call to pt to advise of outpatient cardiac rehab departmental closing additional 30 days for COVID 19 precautions.  Pt verbalized understanding.  Joann Rion, RN, BSN Cardiac Pulmonary Rehab 

## 2018-12-01 NOTE — Telephone Encounter (Signed)
L/m for patient to call back. Need to see if 3/27 appt can be postponed. Richardson Dopp, PA-C    12/01/2018 8:50 AM

## 2018-12-01 NOTE — Telephone Encounter (Signed)
Follow up   Patient called and rescheduled appt per the previous message to 01/05/2019.

## 2018-12-02 ENCOUNTER — Ambulatory Visit (HOSPITAL_COMMUNITY): Payer: Medicare Other

## 2018-12-02 ENCOUNTER — Encounter (HOSPITAL_COMMUNITY): Payer: Medicare Other

## 2018-12-02 DIAGNOSIS — C44729 Squamous cell carcinoma of skin of left lower limb, including hip: Secondary | ICD-10-CM | POA: Diagnosis not present

## 2018-12-04 ENCOUNTER — Ambulatory Visit: Payer: Medicare Other | Admitting: Physician Assistant

## 2018-12-04 ENCOUNTER — Encounter (HOSPITAL_COMMUNITY): Payer: Medicare Other

## 2018-12-04 ENCOUNTER — Ambulatory Visit (HOSPITAL_COMMUNITY): Payer: Medicare Other

## 2018-12-07 ENCOUNTER — Ambulatory Visit (HOSPITAL_COMMUNITY): Payer: Medicare Other

## 2018-12-07 ENCOUNTER — Encounter (HOSPITAL_COMMUNITY): Payer: Medicare Other

## 2018-12-09 ENCOUNTER — Ambulatory Visit (HOSPITAL_COMMUNITY): Payer: Medicare Other

## 2018-12-09 ENCOUNTER — Encounter (HOSPITAL_COMMUNITY): Payer: Medicare Other

## 2018-12-11 ENCOUNTER — Ambulatory Visit (HOSPITAL_COMMUNITY): Payer: Medicare Other

## 2018-12-11 ENCOUNTER — Encounter (HOSPITAL_COMMUNITY): Payer: Medicare Other

## 2018-12-11 ENCOUNTER — Telehealth: Payer: Self-pay | Admitting: Physician Assistant

## 2018-12-11 NOTE — Telephone Encounter (Signed)
New message:    Patient calling to see if it is ok for him to work in his yard? Please call patient.

## 2018-12-14 ENCOUNTER — Ambulatory Visit (HOSPITAL_COMMUNITY): Payer: Medicare Other

## 2018-12-14 ENCOUNTER — Encounter (HOSPITAL_COMMUNITY): Payer: Medicare Other

## 2018-12-16 ENCOUNTER — Ambulatory Visit (HOSPITAL_COMMUNITY): Payer: Medicare Other

## 2018-12-16 ENCOUNTER — Encounter (HOSPITAL_COMMUNITY): Payer: Medicare Other

## 2018-12-17 ENCOUNTER — Telehealth (HOSPITAL_COMMUNITY): Payer: Self-pay | Admitting: Cardiac Rehabilitation

## 2018-12-17 NOTE — Telephone Encounter (Signed)
Pt phone call to inform of continued Outpatient Cardiac Rehab departmental closure for COVID 19 precautions. Future opening date to be determined. Pt instructed to continue exercising on his own following home exercise guidelines. Heis continuing to walk on her own. Pt advised to contact cardiology or PCP PRN symptoms, questions or concerns. Understanding verbalized. Pt denies food insecurity or other needs at this time. Pt offered emotional support and understanding. Pt expressed gratitude for the call and is interested in resuming CRPII when available.  Alaila Pillard, RN, BSN Cardiac Pulmonary Rehab 

## 2018-12-18 ENCOUNTER — Ambulatory Visit (HOSPITAL_COMMUNITY): Payer: Medicare Other

## 2018-12-18 ENCOUNTER — Encounter (HOSPITAL_COMMUNITY): Payer: Medicare Other

## 2018-12-21 ENCOUNTER — Ambulatory Visit (HOSPITAL_COMMUNITY): Payer: Medicare Other

## 2018-12-21 ENCOUNTER — Encounter (HOSPITAL_COMMUNITY): Payer: Medicare Other

## 2018-12-23 ENCOUNTER — Ambulatory Visit (HOSPITAL_COMMUNITY): Payer: Medicare Other

## 2018-12-23 ENCOUNTER — Encounter (HOSPITAL_COMMUNITY): Payer: Medicare Other

## 2018-12-24 DIAGNOSIS — R339 Retention of urine, unspecified: Secondary | ICD-10-CM | POA: Diagnosis not present

## 2018-12-25 ENCOUNTER — Ambulatory Visit (HOSPITAL_COMMUNITY): Payer: Medicare Other

## 2018-12-25 ENCOUNTER — Encounter (HOSPITAL_COMMUNITY): Payer: Medicare Other

## 2018-12-28 ENCOUNTER — Encounter (HOSPITAL_COMMUNITY): Payer: Medicare Other

## 2018-12-28 ENCOUNTER — Ambulatory Visit (HOSPITAL_COMMUNITY): Payer: Medicare Other

## 2018-12-29 ENCOUNTER — Telehealth: Payer: Self-pay | Admitting: Physician Assistant

## 2018-12-29 NOTE — Telephone Encounter (Signed)
°  4/21 :  Called patient. Left message with wife for patient is return my call regarding visit on 01/05/2019.

## 2018-12-30 ENCOUNTER — Ambulatory Visit (HOSPITAL_COMMUNITY): Payer: Medicare Other

## 2018-12-30 ENCOUNTER — Encounter (HOSPITAL_COMMUNITY): Payer: Medicare Other

## 2018-12-30 NOTE — Telephone Encounter (Signed)
Pt's wife returned this office call please give her a call back she is waiting.   Stated thanks.

## 2018-12-31 NOTE — Telephone Encounter (Signed)
NEW MESSAGE   4/23 :  Spoke to patient.     PHONE (landline) visit on 01/05/2019     Phone Call to obtain consent   -  12/31/2018         Virtual Visit Pre-Appointment Phone Call  "(Name), I am calling you today to discuss your upcoming appointment. We are currently trying to limit exposure to the virus that causes COVID-19 by seeing patients at home rather than in the office."  1. "What is the BEST phone number to call the day of the visit?" - include this in appointment notes  2. Do you have or have access to (through a family member/friend) a smartphone with video capability that we can use for your visit?" a. If yes - list this number in appt notes as cell (if different from BEST phone #) and list the appointment type as a VIDEO visit in appointment notes b. If no - list the appointment type as a PHONE visit in appointment notes  3. Confirm consent - "In the setting of the current Covid19 crisis, you are scheduled for a (phone or video) visit with your provider on (date) at (time).  Just as we do with many in-office visits, in order for you to participate in this visit, we must obtain consent.  If you'd like, I can send this to your mychart (if signed up) or email for you to review.  Otherwise, I can obtain your verbal consent now.  All virtual visits are billed to your insurance company just like a normal visit would be.  By agreeing to a virtual visit, we'd like you to understand that the technology does not allow for your provider to perform an examination, and thus may limit your provider's ability to fully assess your condition. If your provider identifies any concerns that need to be evaluated in person, we will make arrangements to do so.  Finally, though the technology is pretty good, we cannot assure that it will always work on either your or our end, and in the setting of a video visit, we may have to convert it to a phone-only visit.  In either situation, we cannot  ensure that we have a secure connection.  Are you willing to proceed?" STAFF: Did the patient verbally acknowledge consent to telehealth visit? Document YES/NO here: YES  4. Advise patient to be prepared - "Two hours prior to your appointment, go ahead and check your blood pressure, pulse, oxygen saturation, and your weight (if you have the equipment to check those) and write them all down. When your visit starts, your provider will ask you for this information. If you have an Apple Watch or Kardia device, please plan to have heart rate information ready on the day of your appointment. Please have a pen and paper handy nearby the day of the visit as well."  5. Give patient instructions for MyChart download to smartphone OR [email or web address] as below if video visit (depending on what platform provider is using)  6. Inform patient they will receive a phone call 15 minutes prior to their appointment time (may be from unknown caller ID) so they should be prepared to answer    TELEPHONE CALL NOTE  James [name] has been deemed a candidate for a follow-up tele-health visit to limit community exposure during the Covid-19 pandemic. I spoke with the patient via phone to ensure availability of phone/video source, confirm preferred email & phone number, and discuss instructions and expectations.  I reminded James [name] to be prepared with any vital sign and/or heart rhythm information that could potentially be obtained via home monitoring, at the time of his visit. I reminded James [name] to expect a phone call prior to his visit.  Thayer Headings 12/31/2018 9:40 AM   INSTRUCTIONS FOR DOWNLOADING THE MYCHART APP TO SMARTPHONE  - The patient must first make sure to have activated MyChart and know their login information - If Apple, go to [employer] and type in MyChart in the search bar and download the app. If Android, ask patient to go to [employer] and type in [place] in the search bar and  download the app. The app is free but as with any other app downloads, their phone may require them to verify saved payment information or Apple/Android password.  - The patient will need to then log into the app with their MyChart username and password, and select [facility] as their healthcare provider to link the account. When it is time for your visit, go to the MyChart app, find appointments, and click Begin Video Visit. Be sure to Select Allow for your device to access the Microphone and Camera for your visit. You will then be connected, and your provider will be with you shortly.  **If they have any issues connecting, or need assistance please contact MyChart service desk [phone])**  **If using a computer, in order to ensure the best quality for their visit they will need to use either of the following Internet Browsers: [employer], or Google Chrome**  IF USING DOXIMITY or DOXY.ME - The patient will receive a link just prior to their visit by text.     FULL LENGTH CONSENT FOR TELE-HEALTH VISIT   I hereby voluntarily request, consent and authorize [facility] and its employed or contracted physicians, physician assistants, nurse practitioners or other licensed health care professionals (the Practitioner), to provide me with telemedicine health care services (the Services") as deemed necessary by the treating Practitioner. I acknowledge and consent to receive the Services by the Practitioner via telemedicine. I understand that the telemedicine visit will involve communicating with the Practitioner through live audiovisual communication technology and the disclosure of certain medical information by electronic transmission. I acknowledge that I have been given the opportunity to request an in-person assessment or other available alternative prior to the telemedicine visit and am voluntarily participating in the telemedicine visit.  I understand that I have the right to  withhold or withdraw my consent to the use of telemedicine in the course of my care at any time, without affecting my right to future care or treatment, and that the Practitioner or I may terminate the telemedicine visit at any time. I understand that I have the right to inspect all information obtained and/or recorded in the course of the telemedicine visit and may receive copies of available information for a reasonable fee.  I understand that some of the potential risks of receiving the Services via telemedicine include:   Delay or interruption in medical evaluation due to technological equipment failure or disruption;  Information transmitted may not be sufficient (e.g. poor resolution of images) to allow for appropriate medical decision making by the Practitioner; and/or   In rare instances, security protocols could fail, causing a breach of personal health information.  Furthermore, I acknowledge that it is my responsibility to provide information about my medical history, conditions and care that is complete and accurate to the best of my ability. I acknowledge  that Practitioner's advice, recommendations, and/or decision may be based on factors not within their control, such as incomplete or inaccurate data provided by me or distortions of diagnostic images or specimens that may result from electronic transmissions. I understand that the practice of medicine is not an exact science and that Practitioner makes no warranties or guarantees regarding treatment outcomes. I acknowledge that I will receive a copy of this consent concurrently upon execution via email to the email address I last provided but may also request a printed copy by calling the office of [facility].    I understand that my insurance will be billed for this visit.   I have read or had this consent read to me.  I understand the contents of this consent, which adequately explains the benefits and risks of the Services being  provided via telemedicine.   I have been provided ample opportunity to ask questions regarding this consent and the Services and have had my questions answered to my satisfaction.  I give my informed consent for the services to be provided through the use of telemedicine in my medical care  By participating in this telemedicine visit I agree to the above.

## 2019-01-01 ENCOUNTER — Ambulatory Visit (HOSPITAL_COMMUNITY): Payer: Medicare Other

## 2019-01-01 ENCOUNTER — Encounter (HOSPITAL_COMMUNITY): Payer: Medicare Other

## 2019-01-04 ENCOUNTER — Ambulatory Visit (HOSPITAL_COMMUNITY): Payer: Medicare Other

## 2019-01-04 ENCOUNTER — Encounter (HOSPITAL_COMMUNITY): Payer: Medicare Other

## 2019-01-04 DIAGNOSIS — I2510 Atherosclerotic heart disease of native coronary artery without angina pectoris: Secondary | ICD-10-CM | POA: Insufficient documentation

## 2019-01-04 NOTE — Progress Notes (Signed)
Virtual Visit via Telephone Note   This visit type was conducted due to national recommendations for restrictions regarding the COVID-19 Pandemic (e.g. social distancing) in an effort to limit this patient's exposure and mitigate transmission in our community.  Due to his co-morbid illnesses, this patient is at least at moderate risk for complications without adequate follow up.  This format is felt to be most appropriate for this patient at this time.  The patient did not have access to video technology/had technical difficulties with video requiring transitioning to audio format only (telephone).  All issues noted in this document were discussed and addressed.  No physical exam could be performed with this format.  Please refer to the patient's chart for his  consent to telehealth for [facility].   Evaluation Performed:  Follow-up visit  Date:  01/05/2019   ID:  James [name], DOB [birth year], MRN [id number]  Patient Location: Home Provider Location: Home  PCP:  Alroy Dust, Carlean Jews.Marlou Sa, MD  Cardiologist:  Sherren Mocha, MD   Electrophysiologist:  None  Urologist:  Dr. Lawerance Bach Dermatologist:  Dr. Onalee Hua / Karma Lew, PA-C  Chief Complaint: Follow-up on CAD  History of Present Illness:    James [name] is a 78 y.o. male with coronary artery disease s/p anteroseptal STEMI in 08/2018 treated with a DES to the LAD, combined systolic and diastolic HF in the setting of ischemic CM with EF 40-45.  He was last seen in the office in 08/2018.  Today, he notes he has been doing well.  He has not had chest discomfort or shortness of breath.  He has not had orthopnea, paroxysmal nocturnal dyspnea, lower extremity swelling or syncope.  He does occasionally note that his blood pressure is low after exercise.  His blood pressure gets as low as 104/52.  He sometimes gets dizzy with standing abruptly.  The patient does not have symptoms concerning for COVID-19 infection (fever, chills, cough, or  new shortness of breath).    Past Medical History:  Diagnosis Date   BPH (benign prostatic hypertrophy)    CAD (coronary artery disease), native coronary artery    a. Cath 08/22/18 for acute anteroseptal MI s/p DES to mLAD   Chronic combined systolic and diastolic heart failure ([place]) 09/04/2018   Ischemic CM // Ant-sept MI 12/19 tx with DES to pLAD   Hyperlipidemia    Kidney stone    Prostatitis    Skin cancer    Trigger finger    Past Surgical History:  Procedure Laterality Date   CARDIAC CATHETERIZATION     CORONARY/GRAFT ACUTE MI REVASCULARIZATION N/A 08/22/2018   Procedure: Coronary/Graft Acute MI Revascularization;  Surgeon: Sherren Mocha, MD;  Location: [facility];  Service: Cardiovascular;  Laterality: N/A;   LEFT HEART CATH AND CORONARY ANGIOGRAPHY N/A 08/22/2018   Procedure: LEFT HEART CATH AND CORONARY ANGIOGRAPHY;  Surgeon: Sherren Mocha, MD;  Location: [facility];  Service: Cardiovascular;  Laterality: N/A;     Current Meds  Medication Sig   aspirin EC 81 MG EC tablet Take 1 tablet (81 mg total) by mouth daily.   atorvastatin (LIPITOR) 80 MG tablet Take 1 tablet (80 mg total) by mouth daily at 6 PM.   carvedilol (COREG) 3.125 MG tablet Take 1 tablet (3.125 mg total) by mouth 2 (two) times daily with a meal.   lisinopril (PRINIVIL,ZESTRIL) 2.5 MG tablet Take 1 tablet (2.5 mg total) by mouth daily.   Multiple Vitamin (MULTIVITAMIN WITH MINERALS) TABS  tablet Take 1 tablet by mouth daily.   nitroGLYCERIN (NITROSTAT) 0.4 MG SL tablet Place 1 tablet (0.4 mg total) under the tongue every 5 (five) minutes x 3 doses as needed for chest pain.   Omega-3 Fatty Acids (FISH OIL) 1000 MG CAPS Take 1,000 mg by mouth at bedtime.    omeprazole (PRILOSEC) 20 MG capsule Take 20 mg by mouth daily.   tamsulosin (FLOMAX) 0.4 MG CAPS capsule Take 0.4 mg by mouth 2 (two) times daily.    ticagrelor (BRILINTA) 90 MG TABS tablet Take 1 tablet (90 mg total)  by mouth 2 (two) times daily.     Allergies:   Dicyclomine hcl and Sulfa antibiotics   Social History   Tobacco Use   Smoking status: Never Smoker   Smokeless tobacco: Never Used  Substance Use Topics   Alcohol use: No   Drug use: No     Family Hx: The patient's family history includes Ovarian cancer in his mother. There is no history of Heart disease.  ROS:   Please see the history of present illness.    All other systems reviewed and are negative.   Prior CV studies:   The following studies were reviewed today:  Echocardiogram 08/23/2018 Mild LVH, EF 40-45, anteroseptal and apical akinesis, grade 1 diastolic dysfunction, mildly dilated RV   Cardiac catheterization 08/22/2018 LM normal LAD proximal 100-heavily thrombotic LCx ostial 25 RCA normal EF 45-50, apical hypokinesis PCI: 3 x 18 mm Resolute Onyx DES to the proximal LAD  Labs/Other Tests and Data Reviewed:    EKG:  No ECG reviewed.  Recent Labs: 08/22/2018: B Natriuretic Peptide 186.4 08/23/2018: Hemoglobin 13.6; Platelets 155 09/04/2018: BUN 27; Creatinine, Ser 0.97; Potassium 4.3; Sodium 140 10/27/2018: ALT 34   Recent Lipid Panel Lab Results  Component Value Date/Time   CHOL 112 10/27/2018 08:40 AM   TRIG 69 10/27/2018 08:40 AM   HDL 38 (L) 10/27/2018 08:40 AM   CHOLHDL 2.9 10/27/2018 08:40 AM   CHOLHDL 3.7 08/23/2018 03:30 AM   LDLCALC 60 10/27/2018 08:40 AM    Wt Readings from Last 3 Encounters:  01/05/19 171 lb 12.8 oz (77.9 kg)  10/29/18 174 lb 9.7 oz (79.2 kg)  09/04/18 177 lb 6.4 oz (80.5 kg)     Objective:    Vital Signs:  BP 124/66 (BP Location: Left Arm, Patient Position: Sitting, Cuff Size: Normal)    Pulse (!) 55    Ht 5\' 9"  (1.753 m)    Wt 171 lb 12.8 oz (77.9 kg)    BMI 25.37 kg/m    VITAL SIGNS:  reviewed GEN:  no acute distress RESPIRATORY:  No labored breathing noted during conversation NEURO:  Alert and oriented PSYCH:  He seems to be in good spirits  ASSESSMENT  & PLAN:    Chronic combined systolic and diastolic heart failure (HCC) EF 40-45 by echocardiogram in December 2019.  He is NYHA 1-2.  Volume status seems to be stable.  He has had some low blood pressures after exercise as well as some dizziness with standing.  I have asked him to change his ACE inhibitor to the evening to see if this helps.  If not, we could consider changing his carvedilol to metoprolol succinate.  -Change lisinopril 2.5 mg to once daily every evening  -Continue carvedilol  Coronary artery disease involving native coronary artery of native heart without angina pectoris History of anteroseptal ST elevation myocardial infarction December 2019 treated with drug-eluting stent to the LAD.  He  is doing well without anginal symptoms.  Continue dual antiplatelet therapy with aspirin and ticagrelor for a total of 1 year.  We will see him back in follow-up in December to decide if he should be transitioned to Plavix versus remaining on aspirin alone.  Hyperlipidemia, unspecified hyperlipidemia type LDL optimal on most recent lab work.  Continue current Rx.    COVID-19 Education: The signs and symptoms of COVID-19 were discussed with the patient and how to seek care for testing (follow up with PCP or arrange E-visit).  The importance of social distancing was discussed today.  Time:   Today, I have spent 21 minutes with the patient with telehealth technology discussing the above problems.     Medication Adjustments/Labs and Tests Ordered: Current medicines are reviewed at length with the patient today.  Concerns regarding medicines are outlined above.   Tests Ordered: No orders of the defined types were placed in this encounter.   Medication Changes: No orders of the defined types were placed in this encounter.   Disposition:  Follow up in 8 month(s)  Signed, Richardson Dopp, PA-C  01/05/2019 2:16 PM    [facility]

## 2019-01-05 ENCOUNTER — Encounter: Payer: Self-pay | Admitting: Physician Assistant

## 2019-01-05 ENCOUNTER — Other Ambulatory Visit: Payer: Self-pay

## 2019-01-05 ENCOUNTER — Telehealth (INDEPENDENT_AMBULATORY_CARE_PROVIDER_SITE_OTHER): Payer: Medicare Other | Admitting: Physician Assistant

## 2019-01-05 VITALS — BP 124/66 | HR 55 | Ht 69.0 in | Wt 171.8 lb

## 2019-01-05 DIAGNOSIS — Z7189 Other specified counseling: Secondary | ICD-10-CM

## 2019-01-05 DIAGNOSIS — I2510 Atherosclerotic heart disease of native coronary artery without angina pectoris: Secondary | ICD-10-CM

## 2019-01-05 DIAGNOSIS — E785 Hyperlipidemia, unspecified: Secondary | ICD-10-CM

## 2019-01-05 DIAGNOSIS — I5042 Chronic combined systolic (congestive) and diastolic (congestive) heart failure: Secondary | ICD-10-CM | POA: Diagnosis not present

## 2019-01-05 NOTE — Patient Instructions (Signed)
Medication Instructions:  Change Lisinopril to once daily in the evening  If you need a refill on your cardiac medications before your next appointment, please call your pharmacy.   Lab work: None   If you have labs (blood work) drawn today and your tests are completely normal, you will receive your results only by: Marland Kitchen MyChart Message (if you have MyChart) OR . A paper copy in the mail If you have any lab test that is abnormal or we need to change your treatment, we will call you to review the results.  Testing/Procedures: None   Follow-Up: At [facility], you and your health needs are our priority.  As part of our continuing mission to provide you with exceptional heart care, we have created designated Provider Care Teams.  These Care Teams include your primary Cardiologist (physician) and Advanced Practice Providers (APPs -  Physician Assistants and Nurse Practitioners) who all work together to provide you with the care you need, when you need it. You will need a follow up appointment in:  8 months.  Please call our office 2 months in advance to schedule this appointment.  You may see Sherren Mocha, MD or Richardson Dopp, PA-C   Any Other Special Instructions Will Be Listed Below (If Applicable).  Call if you continue to have occasional low blood pressures/dizziness.

## 2019-01-06 ENCOUNTER — Ambulatory Visit (HOSPITAL_COMMUNITY): Payer: Medicare Other

## 2019-01-06 ENCOUNTER — Encounter (HOSPITAL_COMMUNITY): Payer: Medicare Other

## 2019-01-07 DIAGNOSIS — C44729 Squamous cell carcinoma of skin of left lower limb, including hip: Secondary | ICD-10-CM | POA: Diagnosis not present

## 2019-01-08 ENCOUNTER — Ambulatory Visit (HOSPITAL_COMMUNITY): Payer: Medicare Other

## 2019-01-08 ENCOUNTER — Encounter (HOSPITAL_COMMUNITY): Payer: Medicare Other

## 2019-01-11 ENCOUNTER — Encounter (HOSPITAL_COMMUNITY): Payer: Medicare Other

## 2019-01-11 ENCOUNTER — Ambulatory Visit (HOSPITAL_COMMUNITY): Payer: Medicare Other

## 2019-01-13 ENCOUNTER — Ambulatory Visit (HOSPITAL_COMMUNITY): Payer: Medicare Other

## 2019-01-13 ENCOUNTER — Encounter (HOSPITAL_COMMUNITY): Payer: Medicare Other

## 2019-01-15 ENCOUNTER — Ambulatory Visit (HOSPITAL_COMMUNITY): Payer: Medicare Other

## 2019-01-15 ENCOUNTER — Encounter (HOSPITAL_COMMUNITY): Payer: Medicare Other

## 2019-01-18 ENCOUNTER — Encounter (HOSPITAL_COMMUNITY): Payer: Medicare Other

## 2019-01-18 ENCOUNTER — Ambulatory Visit (HOSPITAL_COMMUNITY): Payer: Medicare Other

## 2019-01-20 ENCOUNTER — Encounter (HOSPITAL_COMMUNITY): Payer: Medicare Other

## 2019-01-20 ENCOUNTER — Ambulatory Visit (HOSPITAL_COMMUNITY): Payer: Medicare Other

## 2019-01-21 DIAGNOSIS — R339 Retention of urine, unspecified: Secondary | ICD-10-CM | POA: Diagnosis not present

## 2019-01-22 ENCOUNTER — Ambulatory Visit (HOSPITAL_COMMUNITY): Payer: Medicare Other

## 2019-01-22 ENCOUNTER — Encounter (HOSPITAL_COMMUNITY): Payer: Medicare Other

## 2019-01-25 ENCOUNTER — Ambulatory Visit (HOSPITAL_COMMUNITY): Payer: Medicare Other

## 2019-01-25 ENCOUNTER — Encounter (HOSPITAL_COMMUNITY): Payer: Medicare Other

## 2019-01-27 ENCOUNTER — Encounter (HOSPITAL_COMMUNITY): Payer: Medicare Other

## 2019-01-27 ENCOUNTER — Ambulatory Visit (HOSPITAL_COMMUNITY): Payer: Medicare Other

## 2019-01-29 ENCOUNTER — Ambulatory Visit (HOSPITAL_COMMUNITY): Payer: Medicare Other

## 2019-01-29 ENCOUNTER — Encounter (HOSPITAL_COMMUNITY): Payer: Medicare Other

## 2019-02-02 ENCOUNTER — Telehealth (HOSPITAL_COMMUNITY): Payer: Self-pay

## 2019-02-02 NOTE — Telephone Encounter (Signed)
Phone call made to Pt to provide information about virtual cardiac rehab. Pt did not answer. Message was left for Pt to return call.  

## 2019-02-03 ENCOUNTER — Ambulatory Visit (HOSPITAL_COMMUNITY): Payer: Medicare Other

## 2019-02-03 ENCOUNTER — Encounter (HOSPITAL_COMMUNITY): Payer: Medicare Other

## 2019-02-04 DIAGNOSIS — D485 Neoplasm of uncertain behavior of skin: Secondary | ICD-10-CM | POA: Diagnosis not present

## 2019-02-04 DIAGNOSIS — C4491 Basal cell carcinoma of skin, unspecified: Secondary | ICD-10-CM

## 2019-02-04 DIAGNOSIS — C4442 Squamous cell carcinoma of skin of scalp and neck: Secondary | ICD-10-CM | POA: Diagnosis not present

## 2019-02-04 DIAGNOSIS — L818 Other specified disorders of pigmentation: Secondary | ICD-10-CM | POA: Diagnosis not present

## 2019-02-04 DIAGNOSIS — D0471 Carcinoma in situ of skin of right lower limb, including hip: Secondary | ICD-10-CM | POA: Diagnosis not present

## 2019-02-04 DIAGNOSIS — C44219 Basal cell carcinoma of skin of left ear and external auricular canal: Secondary | ICD-10-CM | POA: Diagnosis not present

## 2019-02-04 HISTORY — DX: Basal cell carcinoma of skin, unspecified: C44.91

## 2019-02-05 ENCOUNTER — Encounter (HOSPITAL_COMMUNITY): Payer: Medicare Other

## 2019-02-11 DIAGNOSIS — C44729 Squamous cell carcinoma of skin of left lower limb, including hip: Secondary | ICD-10-CM | POA: Diagnosis not present

## 2019-02-19 DIAGNOSIS — R339 Retention of urine, unspecified: Secondary | ICD-10-CM | POA: Diagnosis not present

## 2019-02-24 NOTE — Addendum Note (Signed)
Encounter addended by: Jeralyn Bennett on: 02/24/2019 11:56 AM  Actions taken: Clinical Note Signed

## 2019-02-24 NOTE — Addendum Note (Signed)
Encounter addended by: Jeralyn Bennett on: 02/24/2019 11:54 AM  Actions taken: Clinical Note Signed

## 2019-02-24 NOTE — Progress Notes (Addendum)
Pt never returned to CR after initial orientation. Phone calls were made to Pt as well as a letter through the mail to encourage Pt to return our call. Pt did not respond. Pt will be discharged from CR.   Deitra Mayo BS, ACSM CEP 02/24/2019 11:55 AM

## 2019-03-01 ENCOUNTER — Encounter (HOSPITAL_COMMUNITY): Payer: Self-pay | Admitting: *Deleted

## 2019-03-01 DIAGNOSIS — I2102 ST elevation (STEMI) myocardial infarction involving left anterior descending coronary artery: Secondary | ICD-10-CM

## 2019-03-01 DIAGNOSIS — Z955 Presence of coronary angioplasty implant and graft: Secondary | ICD-10-CM

## 2019-03-01 NOTE — Progress Notes (Signed)
Discharge Progress Report  Patient Details  Name: James [name] MRN: [id number] Date of Birth: 1941/03/06 Referring Provider:     [place] from 10/29/2018 in [place]  Referring Provider  Dr. Burt Knack        Number of Visits: 10  Reason for Discharge:  Early Exit:  Cardiac rehab has been closed due to COVID 19 Pandemic  Smoking History:  Social History   Tobacco Use  Smoking Status Never Smoker  Smokeless Tobacco Never Used    Diagnosis:  ST elevation myocardial infarction involving left anterior descending (LAD) coronary artery (HCC)n12/14/19  Status post coronary artery stent placement 09/04/18 DES LAD  ADL UCSD:   Initial Exercise Prescription:   Discharge Exercise Prescription (Final Exercise Prescription Changes):   Functional Capacity:   Psychological, QOL, Others - Outcomes: PHQ 2/9: No flowsheet data found.  Quality of Life:   Personal Goals: Goals established at orientation with interventions provided to work toward goal.    Personal Goals Discharge:   Exercise Goals and Review:   Exercise Goals Re-Evaluation:   Nutrition & Weight - Outcomes:    Nutrition:   Nutrition Discharge:   Education Questionnaire Score:  James [name] attended 10 exercise sessions between 10/29/18-11/20/18. James [name]'s attendance was good. Cardiac rehab has been closed since March due to the [place] 19 Pandemic. We have not heard from [facility] regarding virtual cardiac rehab therefore will discharge from phase 2 cardiac rehab.Barnet Pall, RN,BSN 03/01/2019 12:15 PM

## 2019-03-11 DIAGNOSIS — C44729 Squamous cell carcinoma of skin of left lower limb, including hip: Secondary | ICD-10-CM | POA: Diagnosis not present

## 2019-03-18 DIAGNOSIS — N3020 Other chronic cystitis without hematuria: Secondary | ICD-10-CM | POA: Diagnosis not present

## 2019-03-18 DIAGNOSIS — R339 Retention of urine, unspecified: Secondary | ICD-10-CM | POA: Diagnosis not present

## 2019-03-18 DIAGNOSIS — N312 Flaccid neuropathic bladder, not elsewhere classified: Secondary | ICD-10-CM | POA: Diagnosis not present

## 2019-03-18 DIAGNOSIS — N200 Calculus of kidney: Secondary | ICD-10-CM | POA: Diagnosis not present

## 2019-03-18 DIAGNOSIS — N281 Cyst of kidney, acquired: Secondary | ICD-10-CM | POA: Diagnosis not present

## 2019-03-18 DIAGNOSIS — R972 Elevated prostate specific antigen [PSA]: Secondary | ICD-10-CM | POA: Diagnosis not present

## 2019-03-18 DIAGNOSIS — R338 Other retention of urine: Secondary | ICD-10-CM | POA: Diagnosis not present

## 2019-03-23 DIAGNOSIS — R339 Retention of urine, unspecified: Secondary | ICD-10-CM | POA: Diagnosis not present

## 2019-03-29 DIAGNOSIS — C44729 Squamous cell carcinoma of skin of left lower limb, including hip: Secondary | ICD-10-CM | POA: Diagnosis not present

## 2019-04-21 DIAGNOSIS — R339 Retention of urine, unspecified: Secondary | ICD-10-CM | POA: Diagnosis not present

## 2019-05-04 DIAGNOSIS — H5203 Hypermetropia, bilateral: Secondary | ICD-10-CM | POA: Diagnosis not present

## 2019-05-20 DIAGNOSIS — R339 Retention of urine, unspecified: Secondary | ICD-10-CM | POA: Diagnosis not present

## 2019-05-27 DIAGNOSIS — C44719 Basal cell carcinoma of skin of left lower limb, including hip: Secondary | ICD-10-CM | POA: Diagnosis not present

## 2019-05-27 DIAGNOSIS — D485 Neoplasm of uncertain behavior of skin: Secondary | ICD-10-CM | POA: Diagnosis not present

## 2019-05-27 DIAGNOSIS — D0472 Carcinoma in situ of skin of left lower limb, including hip: Secondary | ICD-10-CM | POA: Diagnosis not present

## 2019-05-27 DIAGNOSIS — L570 Actinic keratosis: Secondary | ICD-10-CM | POA: Diagnosis not present

## 2019-05-27 DIAGNOSIS — C44622 Squamous cell carcinoma of skin of right upper limb, including shoulder: Secondary | ICD-10-CM | POA: Diagnosis not present

## 2019-06-09 ENCOUNTER — Telehealth: Payer: Self-pay | Admitting: Physician Assistant

## 2019-06-09 NOTE — Telephone Encounter (Signed)
New Message:   Pt went to his doctor yesterday for an infection in his leg. They prescribed Minocycline 100 mg for him. The pt wants to know if this will be alright for him to take along with all of his other medicine?

## 2019-06-09 NOTE — Telephone Encounter (Signed)
No drug interactions noted, pt is ok to take minocycline.

## 2019-06-09 NOTE — Telephone Encounter (Signed)
Consulted Pharm D who stated: no drug interactions, ok to take minocycline. Made pt aware. Pt verbalized understanding.

## 2019-06-09 NOTE — Telephone Encounter (Signed)
Follow Up:   Wife aid if you do not call before 1:00, please leave a detailed message please.

## 2019-06-18 DIAGNOSIS — R339 Retention of urine, unspecified: Secondary | ICD-10-CM | POA: Diagnosis not present

## 2019-07-03 DIAGNOSIS — Z23 Encounter for immunization: Secondary | ICD-10-CM | POA: Diagnosis not present

## 2019-07-06 ENCOUNTER — Ambulatory Visit: Payer: Medicare Other | Admitting: Physician Assistant

## 2019-07-06 ENCOUNTER — Encounter: Payer: Self-pay | Admitting: Physician Assistant

## 2019-07-06 ENCOUNTER — Other Ambulatory Visit: Payer: Self-pay

## 2019-07-06 VITALS — BP 122/68 | HR 64 | Ht 66.5 in | Wt 174.8 lb

## 2019-07-06 DIAGNOSIS — I5042 Chronic combined systolic (congestive) and diastolic (congestive) heart failure: Secondary | ICD-10-CM | POA: Diagnosis not present

## 2019-07-06 DIAGNOSIS — I2510 Atherosclerotic heart disease of native coronary artery without angina pectoris: Secondary | ICD-10-CM

## 2019-07-06 DIAGNOSIS — E785 Hyperlipidemia, unspecified: Secondary | ICD-10-CM | POA: Diagnosis not present

## 2019-07-06 MED ORDER — METOPROLOL SUCCINATE ER 25 MG PO TB24: 25.0000 mg | ORAL_TABLET | Freq: Every day | ORAL | 3 refills | Status: DC

## 2019-07-06 NOTE — Patient Instructions (Signed)
Medication Instructions:  1) DISCONTINUE Carvedilol 2) START Metoprolol Succinate 25mg  once daily  *If you need a refill on your cardiac medications before your next appointment, please call your pharmacy*  Lab Work: You will need to have fasting labs at your next appointment if you make it in the morning and are able to fast.  Otherwise, please come into the office about a week prior to get these drawn.  If you have labs (blood work) drawn today and your tests are completely normal, you will receive your results only by: Marland Kitchen MyChart Message (if you have MyChart) OR . A paper copy in the mail If you have any lab test that is abnormal or we need to change your treatment, we will call you to review the results.  Testing/Procedures: None  Follow-Up: At [facility], you and your health needs are our priority.  As part of our continuing mission to provide you with exceptional heart care, we have created designated Provider Care Teams.  These Care Teams include your primary Cardiologist (physician) and Advanced Practice Providers (APPs -  Physician Assistants and Nurse Practitioners) who all work together to provide you with the care you need, when you need it.  Your next appointment:   6 months  The format for your next appointment:   In Person  Provider:   You may see Sherren Mocha, MD or one of the following Advanced Practice Providers on your designated Care Team:    Richardson Dopp, PA-C  [place], [place]  Daune Perch, NP   Other Instructions

## 2019-07-06 NOTE — Progress Notes (Signed)
Cardiology Office Note:    Date:  07/06/2019   ID:  ZAYNE GNAGEY, DOB 1941-04-28, MRN [id number]  PCP:  Aurea Graff.Marlou Sa, MD  Cardiologist:  Sherren Mocha, MD  Electrophysiologist:  None  Urologist: Dr. Lawerance Bach Dermatologist: Dr. Onalee Hua / Karma Lew, PA-C  Referring MD: Alroy Dust, Carlean Jews.Marlou Sa, MD   Chief Complaint  Patient presents with   Follow-up    CAD    History of Present Illness:    James [name] is a 78 y.o. male with:   Coronary artery disease  S/p anteroseptal STEMI 08/2018 - tx with DES to the LAD  Combined systolic and diastolic CHF  Ischemic cardiomyopathy, EF 40-45  Hyperlipidemia  James [name] was last seen in April 2020 via telemedicine.  He returns for follow-up.  He is here alone.  He walks most days of the week a distance of about 2 miles.  He also does all his own yard work.  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  He has had one episode of chest discomfort in the middle of the night back in July.  This lasted just a few seconds.  He has not had any exertional chest discomfort or recurrent chest pain.  He has not had significant shortness of breath.  He has not had syncope, orthopnea, lower extremity swelling.  He does note that after he lays down after exercise, he gets lightheaded if he stands up quickly.   Prior CV studies:   The following studies were reviewed today:  Echocardiogram 08/23/2018 Mild LVH, EF 40-45, anteroseptal and apical akinesis, grade 1 diastolic dysfunction, mildly dilated RV  Cardiac catheterization 08/22/2018 LM normal LAD proximal 100-heavily thrombotic LCx ostial 25 RCA normal EF 45-50, apical hypokinesis PCI: 3 x 18 mm Resolute Onyx DES to the proximal LAD  Past Medical History:  Diagnosis Date   BPH (benign prostatic hypertrophy)    CAD (coronary artery disease), native coronary artery    a. Cath 08/22/18 for acute anteroseptal MI s/p DES to mLAD   Chronic combined systolic and diastolic heart failure ([place])  09/04/2018   Ischemic CM // Ant-sept MI 12/19 tx with DES to pLAD   Hyperlipidemia    Kidney stone    Prostatitis    Skin cancer    Trigger finger    Surgical Hx: The patient  has a past surgical history that includes Coronary/Graft Acute MI Revascularization (N/A, 08/22/2018); LEFT HEART CATH AND CORONARY ANGIOGRAPHY (N/A, 08/22/2018); and Cardiac catheterization.   Current Medications: Current Meds  Medication Sig   aspirin EC 81 MG EC tablet Take 1 tablet (81 mg total) by mouth daily.   atorvastatin (LIPITOR) 80 MG tablet Take 1 tablet (80 mg total) by mouth daily at 6 PM.   lisinopril (PRINIVIL,ZESTRIL) 2.5 MG tablet Take 1 tablet (2.5 mg total) by mouth daily.   Multiple Vitamin (MULTIVITAMIN WITH MINERALS) TABS tablet Take 1 tablet by mouth daily.   nitroGLYCERIN (NITROSTAT) 0.4 MG SL tablet Place 1 tablet (0.4 mg total) under the tongue every 5 (five) minutes x 3 doses as needed for chest pain.   Omega-3 Fatty Acids (FISH OIL) 1000 MG CAPS Take 1,000 mg by mouth at bedtime.    omeprazole (PRILOSEC) 20 MG capsule Take 20 mg by mouth daily.   tamsulosin (FLOMAX) 0.4 MG CAPS capsule Take 0.4 mg by mouth 2 (two) times daily.    ticagrelor (BRILINTA) 90 MG TABS tablet Take 1 tablet (90 mg total) by mouth 2 (two) times daily.   [DISCONTINUED]  carvedilol (COREG) 3.125 MG tablet Take 1 tablet (3.125 mg total) by mouth 2 (two) times daily with a meal.     Allergies:   Dicyclomine hcl and Sulfa antibiotics   Social History   Tobacco Use   Smoking status: Never Smoker   Smokeless tobacco: Never Used  Substance Use Topics   Alcohol use: No   Drug use: No     Family Hx: The patient's family history includes Ovarian cancer in his mother. There is no history of Heart disease.  ROS:   Please see the history of present illness.    Review of Systems  Hematologic/Lymphatic: Bruises/bleeds easily.  Gastrointestinal: Negative for hematochezia.  Genitourinary:  Negative for hematuria.   All other systems reviewed and are negative.   EKGs/Labs/Other Test Reviewed:    EKG:  EKG is ordered today.  The ekg ordered today demonstrates normal sinus rhythm, heart rate 64, first degree AV block, PR 258, QTC 392, septal Q waves, no ST-T wave changes  Recent Labs: 08/22/2018: B Natriuretic Peptide 186.4 08/23/2018: Hemoglobin 13.6; Platelets 155 09/04/2018: BUN 27; Creatinine, Ser 0.97; Potassium 4.3; Sodium 140 10/27/2018: ALT 34   Recent Lipid Panel Lab Results  Component Value Date/Time   CHOL 112 10/27/2018 08:40 AM   TRIG 69 10/27/2018 08:40 AM   HDL 38 (L) 10/27/2018 08:40 AM   CHOLHDL 2.9 10/27/2018 08:40 AM   CHOLHDL 3.7 08/23/2018 03:30 AM   LDLCALC 60 10/27/2018 08:40 AM     Physical Exam:    VS:  BP 122/68    Pulse 64    Ht 5' 6.5" (1.689 m)    Wt 174 lb 12.8 oz (79.3 kg)    BMI 27.79 kg/m     Wt Readings from Last 3 Encounters:  07/06/19 174 lb 12.8 oz (79.3 kg)  01/05/19 171 lb 12.8 oz (77.9 kg)  10/29/18 174 lb 9.7 oz (79.2 kg)     Physical Exam  Constitutional: He is oriented to person, place, and time. He appears well-developed and well-nourished. No distress.  HENT:  Head: Normocephalic and atraumatic.  Eyes: No scleral icterus.  Neck: No JVD present. No thyromegaly present.  Cardiovascular: Normal rate, regular rhythm and normal heart sounds.  No murmur heard. Pulmonary/Chest: Effort normal and breath sounds normal. He has no rales.  Abdominal: Soft. There is no hepatomegaly.  Musculoskeletal:        General: No edema.  Lymphadenopathy:    He has no cervical adenopathy.  Neurological: He is alert and oriented to person, place, and time.  Skin: Skin is warm and dry.  Psychiatric: He has a normal mood and affect.    ASSESSMENT & PLAN:    1. Coronary artery disease involving native coronary artery of native heart without angina pectoris History of anteroseptal STEMI December 2019 treated with a drug-eluting  stent to LAD.  He is doing well without anginal symptoms.  Continue aspirin, Brilinta, high-dose statin, beta-blocker, ACE inhibitor.  Follow-up in 6 months with Dr. Burt Knack or me.  I will review with Dr. Burt Knack whether we should stop his Brilinta 1 year out or switch him to Plavix.  2. Chronic combined systolic and diastolic heart failure (HCC) EF 40-45 by echocardiogram at the time of his myocardial infarction.  He is NYHA 1-2.  No evidence of volume overload.  He is not on diuretic therapy.  Continue beta-blocker, ACE inhibitor.  He has noted some drops in his blood pressure post exercise when he stands up quickly.  He  brings in a list of his blood pressures.  I think he would do better on metoprolol rather than carvedilol.  -DC carvedilol  -Start metoprolol succinate 25 mg daily  -He should contact us if his blood pressure runs above target (>130/80)  3. Hyperlipidemia, unspecified hyperlipidemia type LDL optimal on most recent lab work.  Continue current Rx.  Arrange follow-up CMET, lipid panel prior to next visit.   Dispo:  Return in about 6 months (around 01/04/2020) for Routine Follow Up, w/ Dr. Burt Knack, or Richardson Dopp, PA-C, in person.   Medication Adjustments/Labs and Tests Ordered: Current medicines are reviewed at length with the patient today.  Concerns regarding medicines are outlined above.  Tests Ordered: Orders Placed This Encounter  Procedures   Basic metabolic panel   Hepatic function panel   Lipid panel   EKG 12-Lead   Medication Changes: Meds ordered this encounter  Medications   metoprolol succinate (TOPROL XL) 25 MG 24 hr tablet    Sig: Take 1 tablet (25 mg total) by mouth daily.    Dispense:  90 tablet    Refill:  3    D/c Coreg    Signed, Richardson Dopp, PA-C  07/06/2019 2:24 PM    [address], [place], [place]  [address] Phone: [phone]; Fax: [phone]

## 2019-07-07 ENCOUNTER — Telehealth: Payer: Self-pay | Admitting: Physician Assistant

## 2019-07-07 NOTE — Telephone Encounter (Signed)
I reviewed with Dr. Burt Knack regarding his Brilinta. Please call the patient. He can remain on Brilinta until the end of December. Starting Sep 10 2019, he can stop Brilinta and remain on Aspirin. Richardson Dopp, PA-C    07/07/2019 11:16 AM

## 2019-07-07 NOTE — Telephone Encounter (Signed)
S/w pt is aware of recommendation's.  Pt asked about upcoming appt and fasting labs. Explained the recall system and there would be a letter in about 6 months reminding pt of upcoming appt will have fasting labs same day.

## 2019-07-07 NOTE — Telephone Encounter (Signed)
-----   Message from Sherren Mocha, MD sent at 07/06/2019  5:53 PM EDT ----- I'd just have him stop brilinta at 12 months and stay on long term ASA 81 mg ----- Message ----- From: Liliane Shi, PA-C Sent: 07/06/2019   2:25 PM EDT To: Sherren Mocha, MD  Ronalee Belts - Should he remain on ASA alone 1 year post MI or should we switch Brilinta to Plavix and continue DAPT? Thanks, [employer]

## 2019-07-13 DIAGNOSIS — C44629 Squamous cell carcinoma of skin of left upper limb, including shoulder: Secondary | ICD-10-CM | POA: Diagnosis not present

## 2019-07-13 DIAGNOSIS — L570 Actinic keratosis: Secondary | ICD-10-CM | POA: Diagnosis not present

## 2019-07-19 DIAGNOSIS — R339 Retention of urine, unspecified: Secondary | ICD-10-CM | POA: Diagnosis not present

## 2019-08-16 DIAGNOSIS — R339 Retention of urine, unspecified: Secondary | ICD-10-CM | POA: Diagnosis not present

## 2019-09-07 ENCOUNTER — Other Ambulatory Visit: Payer: Self-pay | Admitting: Cardiovascular Disease

## 2019-09-16 DIAGNOSIS — L570 Actinic keratosis: Secondary | ICD-10-CM | POA: Diagnosis not present

## 2019-09-16 DIAGNOSIS — C44622 Squamous cell carcinoma of skin of right upper limb, including shoulder: Secondary | ICD-10-CM | POA: Diagnosis not present

## 2019-09-16 DIAGNOSIS — D485 Neoplasm of uncertain behavior of skin: Secondary | ICD-10-CM | POA: Diagnosis not present

## 2019-09-16 DIAGNOSIS — D487 Neoplasm of uncertain behavior of other specified sites: Secondary | ICD-10-CM | POA: Diagnosis not present

## 2019-09-16 DIAGNOSIS — C44629 Squamous cell carcinoma of skin of left upper limb, including shoulder: Secondary | ICD-10-CM | POA: Diagnosis not present

## 2019-09-17 ENCOUNTER — Telehealth: Payer: Self-pay | Admitting: Physician Assistant

## 2019-09-17 NOTE — Telephone Encounter (Signed)
We are recommending the COVID-19 vaccine to all of our patients. Cardiac medications (including blood thinners) should not deter anyone from being vaccinated and there is no need to hold any of those medications prior to vaccine administration.     Currently, there is a hotline to call (active 09/17/19) to schedule vaccination appointments as no walk-ins will be accepted.   Number: [phone]    If you have further questions or concerns about the vaccine process, please visit [email or web address] or contact your primary care physician.

## 2019-09-19 ENCOUNTER — Other Ambulatory Visit: Payer: Self-pay | Admitting: Cardiovascular Disease

## 2019-09-21 DIAGNOSIS — N400 Enlarged prostate without lower urinary tract symptoms: Secondary | ICD-10-CM | POA: Diagnosis not present

## 2019-09-21 DIAGNOSIS — Z0000 Encounter for general adult medical examination without abnormal findings: Secondary | ICD-10-CM | POA: Diagnosis not present

## 2019-09-21 DIAGNOSIS — E7800 Pure hypercholesterolemia, unspecified: Secondary | ICD-10-CM | POA: Diagnosis not present

## 2019-09-21 DIAGNOSIS — I2510 Atherosclerotic heart disease of native coronary artery without angina pectoris: Secondary | ICD-10-CM | POA: Diagnosis not present

## 2019-09-21 NOTE — Telephone Encounter (Signed)
Ok to fill with 4 refills to be sure pt gets his labs in April as scheduled.

## 2019-09-29 ENCOUNTER — Ambulatory Visit: Payer: Medicare Other | Attending: Internal Medicine

## 2019-09-29 DIAGNOSIS — Z23 Encounter for immunization: Secondary | ICD-10-CM

## 2019-09-29 NOTE — Progress Notes (Signed)
   Covid-19 Vaccination Clinic  Name:  James [name]    MRN: [id number] DOB: 1941/07/23  09/29/2019  [name] was observed post Covid-19 immunization for 15 minutes without incidence. He was provided with Vaccine Information Sheet and instruction to access the V-Safe system.   [name] was instructed to call 911 with any severe reactions post vaccine: Marland Kitchen Difficulty breathing  . Swelling of your face and throat  . A fast heartbeat  . A bad rash all over your body  . Dizziness and weakness    Immunizations Administered    Name Date Dose VIS Date Route   Pfizer COVID-19 Vaccine 09/29/2019 10:15 AM 0.3 mL 08/20/2019 Intramuscular   Manufacturer: [place]   Lot: [id number]   [place]: [id number]

## 2019-10-10 ENCOUNTER — Other Ambulatory Visit: Payer: Self-pay | Admitting: Cardiovascular Disease

## 2019-10-18 DIAGNOSIS — R972 Elevated prostate specific antigen [PSA]: Secondary | ICD-10-CM | POA: Diagnosis not present

## 2019-10-18 DIAGNOSIS — N401 Enlarged prostate with lower urinary tract symptoms: Secondary | ICD-10-CM | POA: Diagnosis not present

## 2019-10-18 DIAGNOSIS — N312 Flaccid neuropathic bladder, not elsewhere classified: Secondary | ICD-10-CM | POA: Diagnosis not present

## 2019-10-18 DIAGNOSIS — N200 Calculus of kidney: Secondary | ICD-10-CM | POA: Diagnosis not present

## 2019-10-18 DIAGNOSIS — N3020 Other chronic cystitis without hematuria: Secondary | ICD-10-CM | POA: Diagnosis not present

## 2019-10-20 ENCOUNTER — Ambulatory Visit: Payer: Medicare Other | Attending: Internal Medicine

## 2019-10-20 DIAGNOSIS — Z23 Encounter for immunization: Secondary | ICD-10-CM | POA: Insufficient documentation

## 2019-10-20 NOTE — Progress Notes (Signed)
   Covid-19 Vaccination Clinic  Name:  James [name]    MRN: [id number] DOB: 1940/10/27  10/20/2019  [name] was observed post Covid-19 immunization for 15 minutes without incidence. He was provided with Vaccine Information Sheet and instruction to access the V-Safe system.   [name] was instructed to call 911 with any severe reactions post vaccine: Marland Kitchen Difficulty breathing  . Swelling of your face and throat  . A fast heartbeat  . A bad rash all over your body  . Dizziness and weakness    Immunizations Administered    Name Date Dose VIS Date Route   Pfizer COVID-19 Vaccine 10/20/2019  9:42 AM 0.3 mL 08/20/2019 Intramuscular   Manufacturer: [place]   Lot: [id number]   [place]: [id number]

## 2019-11-11 DIAGNOSIS — R339 Retention of urine, unspecified: Secondary | ICD-10-CM | POA: Diagnosis not present

## 2019-11-18 DIAGNOSIS — R109 Unspecified abdominal pain: Secondary | ICD-10-CM | POA: Diagnosis not present

## 2019-11-18 DIAGNOSIS — K5900 Constipation, unspecified: Secondary | ICD-10-CM | POA: Diagnosis not present

## 2019-11-20 DIAGNOSIS — N3001 Acute cystitis with hematuria: Secondary | ICD-10-CM | POA: Diagnosis not present

## 2019-11-20 DIAGNOSIS — R300 Dysuria: Secondary | ICD-10-CM | POA: Diagnosis not present

## 2019-11-30 DIAGNOSIS — N281 Cyst of kidney, acquired: Secondary | ICD-10-CM | POA: Diagnosis not present

## 2019-11-30 DIAGNOSIS — R399 Unspecified symptoms and signs involving the genitourinary system: Secondary | ICD-10-CM | POA: Diagnosis not present

## 2019-11-30 DIAGNOSIS — N3020 Other chronic cystitis without hematuria: Secondary | ICD-10-CM | POA: Diagnosis not present

## 2019-11-30 DIAGNOSIS — Z87442 Personal history of urinary calculi: Secondary | ICD-10-CM | POA: Diagnosis not present

## 2019-11-30 DIAGNOSIS — N400 Enlarged prostate without lower urinary tract symptoms: Secondary | ICD-10-CM | POA: Diagnosis not present

## 2019-11-30 DIAGNOSIS — N210 Calculus in bladder: Secondary | ICD-10-CM | POA: Diagnosis not present

## 2019-12-21 ENCOUNTER — Other Ambulatory Visit: Payer: Self-pay

## 2019-12-21 ENCOUNTER — Encounter: Payer: Self-pay | Admitting: Physician Assistant

## 2019-12-21 ENCOUNTER — Ambulatory Visit: Payer: Medicare Other | Admitting: Physician Assistant

## 2019-12-21 DIAGNOSIS — D492 Neoplasm of unspecified behavior of bone, soft tissue, and skin: Secondary | ICD-10-CM

## 2019-12-21 DIAGNOSIS — D485 Neoplasm of uncertain behavior of skin: Secondary | ICD-10-CM

## 2019-12-21 DIAGNOSIS — L820 Inflamed seborrheic keratosis: Secondary | ICD-10-CM

## 2019-12-21 DIAGNOSIS — Z85828 Personal history of other malignant neoplasm of skin: Secondary | ICD-10-CM

## 2019-12-21 DIAGNOSIS — L700 Acne vulgaris: Secondary | ICD-10-CM

## 2019-12-21 DIAGNOSIS — L570 Actinic keratosis: Secondary | ICD-10-CM | POA: Diagnosis not present

## 2019-12-21 NOTE — Patient Instructions (Signed)

## 2019-12-21 NOTE — Progress Notes (Addendum)
Follow-Up Visit   Subjective  James [name] is a 79 y.o. male who presents for the following: Skin Problem (spot on scalp x 1 week, no bleeding). Tender, red, raised. Persistent.  Right inner knee- tender growth, persistent, pink with crust, no treatment, growing. H/O numerous NMSC.  The following portions of the chart were reviewed this encounter and updated as appropriate: Tobacco  Allergies  Meds  Problems  Med Hx  Surg Hx  Fam Hx      Objective  Well appearing patient in no apparent distress; mood and affect are within normal limits.  A focused examination was performed including waist up plus legs. Relevant physical exam findings are noted in the Assessment and Plan.  Objective  Mid Parietal Scalp: Hyperkeratotic scale with pink base      Objective  Right Lower Leg - Anterior: Volcano growth on pink base      Objective  Posterior Mid Neck: Dark keratin in open pore  Objective  Left Breast, Left Elbow - Posterior, Left Shoulder - Anterior, Left Shoulder - Posterior, Left Upper Back, Neck - Anterior (2), Neck - Posterior, Right Abdomen (side) - Upper, Right Breast, Right Shoulder - Posterior, Right Upper Back: Erythematous stuck-on, waxy papule or plaque.   Objective  Left Ear: Well healed scar with no evidence of recurrence.   Assessment & Plan  Neoplasm of skin (2) Mid Parietal Scalp  Skin / nail biopsy Type of biopsy: tangential   Informed consent: discussed and consent obtained   Timeout: patient name, date of birth, surgical site, and procedure verified   Procedure prep:  Patient was prepped and draped in usual sterile fashion Prep type:  Chlorhexidine Anesthesia: the lesion was anesthetized in a standard fashion   Anesthetic:  1% lidocaine w/ epinephrine 1-100,000 local infiltration Instrument used: flexible razor blade   Hemostasis achieved with: aluminum chloride   Outcome: patient tolerated procedure well   Post-procedure details: wound  care instructions given   Additional details:  PERFORMED ED&C AFTER BIOPSY  TX p BX 0.9cm   Destruction of lesion Complexity: extensive   Destruction method: electrodesiccation and curettage   Informed consent: discussed and consent obtained   Timeout:  patient name, date of birth, surgical site, and procedure verified Procedure prep:  Patient was prepped and draped in usual sterile fashion Prep type:  Isopropyl alcohol Anesthesia: the lesion was anesthetized in a standard fashion   Anesthetic:  1% lidocaine w/ epinephrine 1-100,000 buffered w/ 8.4% NaHCO3 Curettage performed in three different directions: Yes   Electrodesiccation performed over the curetted area: Yes   Lesion length (cm):  0.9 Lesion width (cm):  0.9 Margin per side (cm):  0 Final wound size (cm):  0.9 Hemostasis achieved with:  pressure, aluminum chloride and electrodesiccation Outcome: patient tolerated procedure well with no complications   Post-procedure details: sterile dressing applied and wound care instructions given   Dressing type: bandage and petrolatum    Specimen 1 - Surgical pathology Differential Diagnosis: scc Check Margins: No  Right Lower Leg - Anterior  Skin / nail biopsy Type of biopsy: tangential   Informed consent: discussed and consent obtained   Timeout: patient name, date of birth, surgical site, and procedure verified   Procedure prep:  Patient was prepped and draped in usual sterile fashion Prep type:  Chlorhexidine Anesthesia: the lesion was anesthetized in a standard fashion   Anesthetic:  1% lidocaine w/ epinephrine 1-100,000 local infiltration Instrument used: flexible razor blade   Hemostasis achieved with:  aluminum chloride   Outcome: patient tolerated procedure well   Post-procedure details: wound care instructions given   Additional details:  PERFORMED ED&C AFTER BIOPSY  TX p BX 1.0cm  Destruction of lesion Complexity: extensive   Destruction method:  electrodesiccation and curettage   Informed consent: discussed and consent obtained   Timeout:  patient name, date of birth, surgical site, and procedure verified Procedure prep:  Patient was prepped and draped in usual sterile fashion Prep type:  Isopropyl alcohol Anesthesia: the lesion was anesthetized in a standard fashion   Anesthetic:  1% lidocaine w/ epinephrine 1-100,000 buffered w/ 8.4% NaHCO3 Curettage performed in three different directions: Yes   Electrodesiccation performed over the curetted area: Yes   Lesion length (cm):  1 Lesion width (cm):  1 Margin per side (cm):  0 Final wound size (cm):  1 Hemostasis achieved with:  pressure, aluminum chloride and electrodesiccation Outcome: patient tolerated procedure well with no complications   Post-procedure details: sterile dressing applied and wound care instructions given   Dressing type: bandage and petrolatum    Specimen 2 - Surgical pathology Differential Diagnosis: KA Check Margins: No  Open comedone Posterior Mid Neck    Acne/Milia surgery - Posterior Mid Neck Location: post mid neck  Informed Consent: Discussed risks (permanent scarring, light or dark discoloration, infection, pain, bleeding, bruising, redness, damage to adjacent structures, and recurrence of the lesion) and benefits of the procedure, as well as the alternatives.  Informed consent was obtained.  Preparation: The area was prepped with alcohol.  Anesthesia: Lidocaine 1% with epinephrine  Procedure Details: An incision was made overlying the lesion. The lesion drained pus.  A small amount of fluid was drained.    Antibiotic ointment and a sterile pressure dressing were applied. The patient tolerated procedure well.  Total number of lesions drained: 1  Plan: The patient was instructed on post-op care. Recommend OTC analgesia as needed for pain.   Seborrheic keratoses, inflamed (12) Left Shoulder - Anterior; Left Shoulder - Posterior; Right  Shoulder - Posterior; Left Elbow - Posterior; Neck - Anterior (2); Neck - Posterior; Left Breast; Right Breast; Right Abdomen (side) - Upper; Left Upper Back; Right Upper Back  Destruction of lesion - Left Breast, Left Elbow - Posterior, Left Shoulder - Anterior, Left Shoulder - Posterior, Left Upper Back, Neck - Anterior (2), Neck - Posterior, Right Abdomen (side) - Upper, Right Breast, Right Shoulder - Posterior, Right Upper Back Complexity: simple   Destruction method: cryotherapy   Informed consent: discussed and consent obtained   Timeout:  patient name, date of birth, surgical site, and procedure verified Lesion destroyed using liquid nitrogen: Yes   Region frozen until ice ball extended beyond lesion: Yes   Outcome: patient tolerated procedure well with no complications   Post-procedure details: wound care instructions given    History of basal cell carcinoma (BCC) Left Ear  Clear today. Observe

## 2019-12-24 ENCOUNTER — Telehealth: Payer: Self-pay | Admitting: *Deleted

## 2019-12-24 NOTE — Telephone Encounter (Signed)
-----   Message from Warren Danes, [place] sent at 12/24/2019 10:13 AM EDT ----- Recheck 3 mo

## 2019-12-24 NOTE — Telephone Encounter (Signed)
Informed patients wife of pathology results and to recheck in 3 months. He has appointment on May 6 we will cancel that appointment and schedule a 3 month.

## 2019-12-28 DIAGNOSIS — N3020 Other chronic cystitis without hematuria: Secondary | ICD-10-CM | POA: Diagnosis not present

## 2019-12-28 DIAGNOSIS — Z20822 Contact with and (suspected) exposure to covid-19: Secondary | ICD-10-CM | POA: Diagnosis not present

## 2019-12-28 DIAGNOSIS — N210 Calculus in bladder: Secondary | ICD-10-CM | POA: Diagnosis not present

## 2019-12-28 DIAGNOSIS — Z20828 Contact with and (suspected) exposure to other viral communicable diseases: Secondary | ICD-10-CM | POA: Diagnosis not present

## 2019-12-28 DIAGNOSIS — N401 Enlarged prostate with lower urinary tract symptoms: Secondary | ICD-10-CM | POA: Diagnosis not present

## 2019-12-31 ENCOUNTER — Other Ambulatory Visit: Payer: Medicare Other

## 2020-01-04 ENCOUNTER — Ambulatory Visit: Payer: Medicare Other | Admitting: Physician Assistant

## 2020-01-04 DIAGNOSIS — N401 Enlarged prostate with lower urinary tract symptoms: Secondary | ICD-10-CM | POA: Diagnosis not present

## 2020-01-04 DIAGNOSIS — N210 Calculus in bladder: Secondary | ICD-10-CM | POA: Diagnosis not present

## 2020-01-04 DIAGNOSIS — N3020 Other chronic cystitis without hematuria: Secondary | ICD-10-CM | POA: Diagnosis not present

## 2020-01-04 DIAGNOSIS — N400 Enlarged prostate without lower urinary tract symptoms: Secondary | ICD-10-CM | POA: Diagnosis not present

## 2020-01-07 ENCOUNTER — Other Ambulatory Visit: Payer: Medicare Other

## 2020-01-10 DIAGNOSIS — R339 Retention of urine, unspecified: Secondary | ICD-10-CM | POA: Diagnosis not present

## 2020-01-11 ENCOUNTER — Ambulatory Visit: Payer: Medicare Other | Admitting: Physician Assistant

## 2020-01-13 ENCOUNTER — Ambulatory Visit: Payer: Medicare Other | Admitting: Physician Assistant

## 2020-01-14 ENCOUNTER — Other Ambulatory Visit: Payer: Self-pay

## 2020-01-14 ENCOUNTER — Other Ambulatory Visit: Payer: Medicare Other | Admitting: *Deleted

## 2020-01-14 DIAGNOSIS — I5042 Chronic combined systolic (congestive) and diastolic (congestive) heart failure: Secondary | ICD-10-CM

## 2020-01-14 DIAGNOSIS — I2510 Atherosclerotic heart disease of native coronary artery without angina pectoris: Secondary | ICD-10-CM

## 2020-01-14 LAB — BASIC METABOLIC PANEL
BUN/Creatinine Ratio: 21 (ref 10–24)
BUN: 21 mg/dL (ref 8–27)
CO2: 23 mmol/L (ref 20–29)
Calcium: 9.3 mg/dL (ref 8.6–10.2)
Chloride: 102 mmol/L (ref 96–106)
Creatinine, Ser: 1.02 mg/dL (ref 0.76–1.27)
GFR calc Af Amer: 81 mL/min/{1.73_m2} (ref >59)
GFR calc non Af Amer: 70 mL/min/{1.73_m2} (ref >59)
Glucose: 94 mg/dL (ref 65–99)
Potassium: 4.5 mmol/L (ref 3.5–5.2)
Sodium: 140 mmol/L (ref 134–144)

## 2020-01-14 LAB — HEPATIC FUNCTION PANEL
ALT: 24 IU/L (ref 0–44)
AST: 17 IU/L (ref 0–40)
Albumin: 4.4 g/dL (ref 3.7–4.7)
Alkaline Phosphatase: 76 IU/L (ref 39–117)
Bilirubin Total: 0.5 mg/dL (ref 0.0–1.2)
Bilirubin, Direct: 0.16 mg/dL (ref 0.00–0.40)
Total Protein: 6.5 g/dL (ref 6.0–8.5)

## 2020-01-14 LAB — LIPID PANEL
Chol/HDL Ratio: 2.5 ratio (ref 0.0–5.0)
Cholesterol, Total: 101 mg/dL (ref 100–199)
HDL: 41 mg/dL (ref >39)
LDL Chol Calc (NIH): 47 mg/dL (ref 0–99)
Triglycerides: 55 mg/dL (ref 0–149)
VLDL Cholesterol Cal: 13 mg/dL (ref 5–40)

## 2020-01-17 ENCOUNTER — Ambulatory Visit: Payer: Medicare Other | Admitting: Physician Assistant

## 2020-01-20 DIAGNOSIS — N210 Calculus in bladder: Secondary | ICD-10-CM | POA: Diagnosis not present

## 2020-01-20 DIAGNOSIS — Z09 Encounter for follow-up examination after completed treatment for conditions other than malignant neoplasm: Secondary | ICD-10-CM | POA: Diagnosis not present

## 2020-01-20 DIAGNOSIS — N312 Flaccid neuropathic bladder, not elsewhere classified: Secondary | ICD-10-CM | POA: Diagnosis not present

## 2020-01-20 DIAGNOSIS — R339 Retention of urine, unspecified: Secondary | ICD-10-CM | POA: Diagnosis not present

## 2020-02-08 ENCOUNTER — Ambulatory Visit: Payer: Medicare Other | Admitting: Physician Assistant

## 2020-02-08 ENCOUNTER — Other Ambulatory Visit: Payer: Self-pay

## 2020-02-08 ENCOUNTER — Encounter: Payer: Self-pay | Admitting: Physician Assistant

## 2020-02-08 VITALS — BP 120/62 | HR 62 | Ht 66.5 in | Wt 173.0 lb

## 2020-02-08 DIAGNOSIS — I5042 Chronic combined systolic (congestive) and diastolic (congestive) heart failure: Secondary | ICD-10-CM

## 2020-02-08 DIAGNOSIS — I951 Orthostatic hypotension: Secondary | ICD-10-CM | POA: Diagnosis not present

## 2020-02-08 DIAGNOSIS — E782 Mixed hyperlipidemia: Secondary | ICD-10-CM

## 2020-02-08 DIAGNOSIS — I2510 Atherosclerotic heart disease of native coronary artery without angina pectoris: Secondary | ICD-10-CM

## 2020-02-08 MED ORDER — METOPROLOL SUCCINATE ER 25 MG PO TB24
12.5000 mg | ORAL_TABLET | Freq: Every day | ORAL | 3 refills | Status: DC
Start: 2020-02-08 — End: 2020-08-23

## 2020-02-08 NOTE — Progress Notes (Signed)
Cardiology Office Note:    Date:  02/08/2020   ID:  James [name], DOB 1941/04/16, MRN [id number]  PCP:  James Graff.Marlou Sa, MD  Cardiologist:  Sherren Mocha, MD   Electrophysiologist:  None  Dermatologist: Dr. Onalee Hua / Karma Lew, PA-C  Referring MD: James [name], James [name].Marlou Sa, MD   Chief Complaint:  Follow-up (CAD)    Patient Profile:    James [name] is a 79 y.o. male with:   Coronary artery disease ? 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  He returns for follow up.  He is here alone.  He remains very active without symptoms of angina or significant shortness of breath.  He has not had orthopnea, lower extremity swelling or syncope.  He has continued to note lightheadedness with standing abruptly.  This typically occurs late in the evening after doing some type of activity such as walking or mowing the lawn (push mow).  He would typically come in from this type of activity and sit down for a while.  He may doze off and nap for a short period of time.  If he stands up abruptly, he gets lightheaded and sometimes, near syncopal.  Past Medical History:  Diagnosis Date   Basal cell carcinoma 02/04/2019   left ear rim tx with bx curet and cautery   BPH (benign prostatic hypertrophy)    CAD (coronary artery disease), native coronary artery    a. Cath 08/22/18 for acute anteroseptal MI s/p DES to mLAD   Chronic combined  systolic and diastolic heart failure ([place]) 09/04/2018   Ischemic CM // Ant-sept MI 12/19 tx with DES to pLAD   Hyperlipidemia    Kidney stone    Prostatitis    SCC (squamous cell carcinoma) 07/09/2007   right medial lower leg scc/ka tx cx3 23fu   SCC (squamous cell carcinoma) 04/19/2008   right outer lower leg ka tx cx3 49fu   SCC (squamous cell carcinoma) 03/22/2009   right forearm scc/ka tx cx3 12fu   SCC (squamous cell carcinoma) 05/16/2009   right abdomen well diff scca tx exc   SCC (squamous cell carcinoma) 06/07/2009   left postauricular scca/ka cx3 45fu   SCC (squamous cell carcinoma) 04/30/2011   right upper arm scc well diff tx with bx   SCC (squamous cell carcinoma) 01/08/2012   left inner calf scc/ka tx with bx   SCC (squamous cell carcinoma) 04/29/2012   right hand scc/ka tx with bx   SCC (squamous cell carcinoma) 04/29/2012   right post scalp scc mod diff adenoid cx3 35fu   SCC (squamous cell carcinoma) 06/08/2008   left forearm scc in situ tx with bx   SCC (squamous cell carcinoma) 03/09/2013   left lower calf scc/ka tx with bx   SCC (squamous cell carcinoma) 05/27/2013   left outer thigh diff scc tx with bx   SCC (squamous cell carcinoma) 06/29/2014   left outer knee scc tx with bx   SCC (  squamous cell carcinoma) 06/29/2014   right hand scc tx with bx   SCC (squamous cell carcinoma) 09/28/2014   mid chest well diff scc tx with bx   SCC (squamous cell carcinoma) 09/28/2014   left shin scc/ka tx with bx   SCC (squamous cell carcinoma) 05/30/2015   left abdomen scc well diff tx cx3 24fu   SCC (squamous cell carcinoma) 07/25/2015   left lower leg scc/ka tx with bx   SCC (squamous cell carcinoma) 07/25/2015   right lower leg scc/ka tx with bx   SCC (squamous cell carcinoma) 08/29/2015   right upper arm scc/ka tx with bx   SCC (squamous cell carcinoma) 02/06/2016   left preauricular scc in situ tx with bx   SCC (squamous cell carcinoma)  05/02/2016   right anterior shin scc/ka tx with bx   SCC (squamous cell carcinoma) 06/12/2016   right anterior shin scc/ka tx with bx   SCC (squamous cell carcinoma) 06/12/2016   left outer calf atypical scc tx with bx   SCC (squamous cell carcinoma) 06/12/2016   right upper calf atypical scc tx with bx   SCC (squamous cell carcinoma) 05/22/2017   left calf superior scc/ka tx with bx   SCC (squamous cell carcinoma) 05/22/2017   left calf inferior atypial squamous tx with bx   SCC (squamous cell carcinoma) 02/26/2018   right upper outer shin well diff scc tx with bx mohs   SCC (squamous cell carcinoma) 02/26/2018   left lower inner shin well diff scc tx with bx mohs   SCC (squamous cell carcinoma) 04/01/2018   left outer lower calf well diff scc tx with bx   SCC (squamous cell carcinoma) 07/14/2018   left outer post calf scc well diff tx with bx   SCC (squamous cell carcinoma) 09/24/2018   left calf lateral scc/ka tx with bx   SCC (squamous cell carcinoma) 09/24/2018   left calf medial atypical squamous tx with bx   SCC (squamous cell carcinoma) 11/18/2018   left lower post leg tx mohs   SCC (squamous cell carcinoma) 02/04/2019   left scalp scc/ka tx with bx   SCC (squamous cell carcinoma) 02/04/2019   right thigh scc in situ tx with bx   SCC (squamous cell carcinoma) 05/27/2019   right post shoulder well diff scc tx with bx   SCC (squamous cell carcinoma) 05/27/2019   left calf scc in situ 67mm punch margin free    SCC (squamous cell carcinoma) 07/13/2019   left inner elbow well diff scc tx with bx   SCC (squamous cell carcinoma) 09/16/2019   left hand dorsum well diff scc tx with bx   SCC (squamous cell carcinoma) 09/16/2019   left upper arm well diff scc tx with bx   SCC (squamous cell carcinoma) 09/16/2019   right outer hand atypical squamous  tx with bx    Skin cancer    Squamous cell carcinoma of skin 07/09/2007   right pretibial  scc/ka tx cx3  61fu   Trigger finger     Current Medications: Current Meds  Medication Sig   Aspirin Buf,CaCarb-MgCarb-MgO, 81 MG TABS Take 81 mg by mouth daily.    atorvastatin (LIPITOR) 80 MG tablet TAKE 1 TABLET(80 MG) BY MOUTH DAILY AT 6 PM   lidocaine (XYLOCAINE) 2 % jelly APPLY TOPICALLY AS NEEDED   lisinopril (ZESTRIL) 2.5 MG tablet TAKE 1 TABLET(2.5 MG) BY MOUTH DAILY   Multiple Vitamin (MULTIVITAMIN WITH MINERALS) TABS tablet Take 1 tablet  by mouth daily.   nitroGLYCERIN (NITROSTAT) 0.4 MG SL tablet Place 1 tablet (0.4 mg total) under the tongue every 5 (five) minutes x 3 doses as needed for chest pain.   Omega-3 Fatty Acids (FISH OIL) 1000 MG CAPS Take 1,000 mg by mouth at bedtime.    potassium citrate (UROCIT-K) 10 MEQ (1080 MG) SR tablet Take 10 mEq by mouth daily.    tamsulosin (FLOMAX) 0.4 MG CAPS capsule Take 0.4 mg by mouth 2 (two) times daily.    [DISCONTINUED] metoprolol succinate (TOPROL XL) 25 MG 24 hr tablet Take 1 tablet (25 mg total) by mouth daily.     Allergies:   Dicyclomine hcl and Sulfa antibiotics   Social History   Tobacco Use   Smoking status: Never Smoker   Smokeless tobacco: Never Used  Substance Use Topics   Alcohol use: No   Drug use: No     Family Hx: The patient's family history includes Ovarian cancer in his mother. There is no history of Heart disease.  Review of Systems  Gastrointestinal: Negative for hematochezia and melena.  Genitourinary: Negative for hematuria.     EKGs/Labs/Other Test Reviewed:    EKG:  EKG is   ordered today.  The ekg ordered today demonstrates normal sinus rhythm, heart rate 62, left axis deviation, first-degree AV block, PR interval 238 ms, septal Q waves, QTC 414, no change from prior tracing  Recent Labs: 01/14/2020: ALT 24; BUN 21; Creatinine, Ser 1.02; Potassium 4.5; Sodium 140   Recent Lipid Panel Lab Results  Component Value Date/Time   CHOL 101 01/14/2020 08:40 AM   TRIG 55 01/14/2020 08:40 AM    HDL 41 01/14/2020 08:40 AM   CHOLHDL 2.5 01/14/2020 08:40 AM   CHOLHDL 3.7 08/23/2018 03:30 AM   LDLCALC 47 01/14/2020 08:40 AM    Physical Exam:    VS:  BP 120/62    Pulse 62    Ht 5' 6.5" (1.689 m)    Wt 173 lb (78.5 kg)    SpO2 97%    BMI 27.50 kg/m     Wt Readings from Last 3 Encounters:  02/08/20 173 lb (78.5 kg)  07/06/19 174 lb 12.8 oz (79.3 kg)  01/05/19 171 lb 12.8 oz (77.9 kg)     Constitutional:      Appearance: Healthy appearance. Not in distress.  Neck:     Thyroid: No thyromegaly.     Vascular: JVD normal.  Pulmonary:     Effort: Pulmonary effort is normal.     Breath sounds: No wheezing. No rales.  Cardiovascular:     Normal rate. Regular rhythm. Normal S1. Normal S2.     Murmurs: There is no murmur.  Edema:    Peripheral edema absent.  Abdominal:     Palpations: Abdomen is soft. There is no hepatomegaly.  Skin:    General: Skin is warm and dry.  Neurological:     General: No focal deficit present.     Mental Status: Alert and oriented to person, place and time.     Cranial Nerves: Cranial nerves are intact.      ASSESSMENT & PLAN:    1. Coronary artery disease involving native coronary artery of native heart without angina pectoris History of anteroseptal STEMI in December 2019 treated with a drug-eluting stent to the LAD.  He is doing well without anginal symptoms.  Continue aspirin, atorvastatin, beta-blocker.  2. Chronic combined systolic and diastolic heart failure (HCC) Ischemic cardiomyopathy.  EF 40-45 by  echocardiogram December 2019.  NYHA I-II.  He has not required diuretic therapy.  Volume status remains stable.  He is having some difficulty with orthostasis.  He may not be able to tolerate a combination of beta-blocker and ACE inhibitor going forward.  I have asked him to decrease his metoprolol succinate 12.5 mg daily.  I have also asked him to separate lisinopril and metoprolol to separate times a day (one in the AM and the other in the PM).   If he continues to have dizziness with standing, we may need to stop his lisinopril.  3. Mixed hyperlipidemia LDL optimal on most recent lab work.  Continue current Rx.    4. Orthostasis This seems to mainly occur in the evenings after doing some type of work outside.  I have asked him to continue to hydrate well and stand up slowly after being seated for a prolonged period of time.  Adjust medications as outlined above.  I have also asked him to obtain lower extremity compression socks.  If his symptoms continue despite all of this, we may need to stop his lisinopril altogether.    Dispo:  Return in about 1 year (around 02/07/2021) for Routine Follow Up, w/ Dr. Burt Knack, or Richardson Dopp, PA-C, in person.   Medication Adjustments/Labs and Tests Ordered: Current medicines are reviewed at length with the patient today.  Concerns regarding medicines are outlined above.  Tests Ordered: Orders Placed This Encounter  Procedures   EKG 12-Lead   Medication Changes: Meds ordered this encounter  Medications   metoprolol succinate (TOPROL XL) 25 MG 24 hr tablet    Sig: Take 0.5 tablets (12.5 mg total) by mouth daily.    Dispense:  90 tablet    Refill:  3    Signed, Richardson Dopp, PA-C  02/08/2020 5:39 PM    [facility] [place], [place],   [address] Phone: [phone]; Fax: [phone]

## 2020-02-08 NOTE — Patient Instructions (Signed)
Medication Instructions:  Your physician has recommended you make the following change in your medication:  1. CHANGE LISINOPRIL AND TOPROL TO SEPARATE TIMES OF DAY (AT LEAST 12 HOURS APART)  2. DECREASE TOPROL TO 12.5 MG DAILY.  *If you need a refill on your cardiac medications before your next appointment, please call your pharmacy*   Lab Work: NONE If you have labs (blood work) drawn today and your tests are completely normal, you will receive your results only by: Marland Kitchen MyChart Message (if you have MyChart) OR . A paper copy in the mail If you have any lab test that is abnormal or we need to change your treatment, we will call you to review the results.   Testing/Procedures: NONE   Follow-Up: At [facility], you and your health needs are our priority.  As part of our continuing mission to provide you with exceptional heart care, we have created designated Provider Care Teams.  These Care Teams include your primary Cardiologist (physician) and Advanced Practice Providers (APPs -  Physician Assistants and Nurse Practitioners) who all work together to provide you with the care you need, when you need it.  We recommend signing up for the patient portal called "MyChart".  Sign up information is provided on this After Visit Summary.  MyChart is used to connect with patients for Virtual Visits (Telemedicine).  Patients are able to view lab/test results, encounter notes, upcoming appointments, etc.  Non-urgent messages can be sent to your provider as well.   To learn more about what you can do with MyChart, go to [email or web address].    Your next appointment:   1 year(s)  The format for your next appointment:   In Person  Provider:    You may see Sherren Mocha, MD or Richardson Dopp, PA-C    Other Instructions  1. WEAR KNEE HIGH COMPRESSION SOCKS  2. STAY HYDRATED  3. STAND UP SLOWLY

## 2020-02-10 ENCOUNTER — Other Ambulatory Visit: Payer: Self-pay | Admitting: Cardiovascular Disease

## 2020-02-29 ENCOUNTER — Ambulatory Visit: Payer: Medicare Other | Admitting: Physician Assistant

## 2020-03-14 DIAGNOSIS — R339 Retention of urine, unspecified: Secondary | ICD-10-CM | POA: Diagnosis not present

## 2020-03-21 ENCOUNTER — Ambulatory Visit: Payer: Medicare Other | Admitting: Physician Assistant

## 2020-03-22 DIAGNOSIS — M545 Low back pain: Secondary | ICD-10-CM | POA: Diagnosis not present

## 2020-03-22 DIAGNOSIS — R42 Dizziness and giddiness: Secondary | ICD-10-CM | POA: Diagnosis not present

## 2020-03-23 ENCOUNTER — Ambulatory Visit: Payer: Medicare Other | Admitting: Physician Assistant

## 2020-03-23 ENCOUNTER — Other Ambulatory Visit: Payer: Self-pay

## 2020-03-23 ENCOUNTER — Encounter: Payer: Self-pay | Admitting: Physician Assistant

## 2020-03-23 ENCOUNTER — Encounter (INDEPENDENT_AMBULATORY_CARE_PROVIDER_SITE_OTHER): Payer: Medicare Other | Admitting: Ophthalmology

## 2020-03-23 DIAGNOSIS — Z1283 Encounter for screening for malignant neoplasm of skin: Secondary | ICD-10-CM

## 2020-03-23 DIAGNOSIS — D485 Neoplasm of uncertain behavior of skin: Secondary | ICD-10-CM

## 2020-03-23 DIAGNOSIS — D229 Melanocytic nevi, unspecified: Secondary | ICD-10-CM

## 2020-03-23 DIAGNOSIS — Z85828 Personal history of other malignant neoplasm of skin: Secondary | ICD-10-CM

## 2020-03-23 DIAGNOSIS — L578 Other skin changes due to chronic exposure to nonionizing radiation: Secondary | ICD-10-CM

## 2020-03-23 DIAGNOSIS — T7840XA Allergy, unspecified, initial encounter: Secondary | ICD-10-CM | POA: Diagnosis not present

## 2020-03-23 DIAGNOSIS — L821 Other seborrheic keratosis: Secondary | ICD-10-CM | POA: Diagnosis not present

## 2020-03-23 DIAGNOSIS — D1800 Hemangioma unspecified site: Secondary | ICD-10-CM

## 2020-03-23 DIAGNOSIS — L820 Inflamed seborrheic keratosis: Secondary | ICD-10-CM | POA: Diagnosis not present

## 2020-03-23 DIAGNOSIS — L814 Other melanin hyperpigmentation: Secondary | ICD-10-CM

## 2020-03-23 NOTE — Progress Notes (Signed)
Follow-Up Visit   Subjective  James [name] is a 79 y.o. male who presents for the following: Follow-up (Patient here today for 3-4 month follow up for scalp and right lower leg.  Per patient recheck a spot on his right leg that has flared up, no bleeding.).   The following portions of the chart were reviewed this encounter and updated as appropriate: Tobacco  Allergies  Meds  Problems  Med Hx  Surg Hx  Fam Hx      Objective  Well appearing patient in no apparent distress; mood and affect are within normal limits.  A full examination was performed including scalp, head, eyes, ears, nose, lips, neck, chest, axillae, abdomen, back, buttocks, bilateral upper extremities, bilateral lower extremities, hands, feet, fingers, toes, fingernails, and toenails. All findings within normal limits unless otherwise noted below.  Objective  Right Lower Leg - Anterior: Hyperkeratotic scale with pink base  Txpbx- curet & cautery 1.9cm     Objective  Mid Parietal Scalp: Pearly papule with telangectasia.  Txpbx- curet & cautery 0.9cm     Objective  Right Ear: Hyperkeratotic scale with pink base  Txpbx-curet & cautery 1.1cm     Assessment & Plan  Actinic skin damage Head - Anterior (Face)  Screening exam for skin cancer Head - Anterior (Face)  Seborrheic keratosis Left Breast  Neoplasm of uncertain behavior of skin (3) Right Lower Leg - Anterior- atypical squamous proliferation  Skin / nail biopsy Type of biopsy: tangential   Informed consent: discussed and consent obtained   Timeout: patient name, date of birth, surgical site, and procedure verified   Anesthesia: the lesion was anesthetized in a standard fashion   Anesthetic:  1% lidocaine w/ epinephrine 1-100,000 local infiltration Instrument used: flexible razor blade   Hemostasis achieved with: aluminum chloride and electrodesiccation   Outcome: patient tolerated procedure well   Post-procedure details: wound  care instructions given    Destruction of lesion Complexity: simple   Destruction method: electrodesiccation and curettage   Informed consent: discussed and consent obtained   Timeout:  patient name, date of birth, surgical site, and procedure verified Anesthesia: the lesion was anesthetized in a standard fashion   Anesthetic:  1% lidocaine w/ epinephrine 1-100,000 local infiltration Curettage performed in three different directions: Yes   Electrodesiccation performed over the curetted area: Yes   Curettage cycles:  3 Lesion length (cm):  1.8 Lesion width (cm):  1.8 Margin per side (cm):  0.1 Final wound size (cm):  2 Hemostasis achieved with:  aluminum chloride Outcome: patient tolerated procedure well with no complications   Post-procedure details: wound care instructions given    Specimen 1 - Surgical pathology Differential Diagnosis: scc Check Margins: No  Mid Parietal Scalp  Skin / nail biopsy Type of biopsy: tangential   Informed consent: discussed and consent obtained   Timeout: patient name, date of birth, surgical site, and procedure verified   Anesthesia: the lesion was anesthetized in a standard fashion   Anesthetic:  1% lidocaine w/ epinephrine 1-100,000 local infiltration Instrument used: flexible razor blade   Hemostasis achieved with: aluminum chloride and electrodesiccation   Outcome: patient tolerated procedure well   Post-procedure details: wound care instructions given    Destruction of lesion Complexity: simple   Destruction method: electrodesiccation and curettage   Informed consent: discussed and consent obtained   Timeout:  patient name, date of birth, surgical site, and procedure verified Anesthesia: the lesion was anesthetized in a standard fashion   Anesthetic:  1% lidocaine w/ epinephrine 1-100,000 local infiltration Curettage performed in three different directions: Yes   Electrodesiccation performed over the curetted area: Yes   Curettage  cycles:  3 Lesion length (cm):  0.8 Lesion width (cm):  0.8 Margin per side (cm):  0.1 Final wound size (cm):  1 Hemostasis achieved with:  aluminum chloride and electrodesiccation Outcome: patient tolerated procedure well with no complications   Post-procedure details: wound care instructions given    Specimen 2 - Surgical pathology Differential Diagnosis: scc Check Margins: No  Right Ear  Skin / nail biopsy Type of biopsy: tangential   Informed consent: discussed and consent obtained   Timeout: patient name, date of birth, surgical site, and procedure verified   Anesthesia: the lesion was anesthetized in a standard fashion   Anesthetic:  1% lidocaine w/ epinephrine 1-100,000 local infiltration Instrument used: flexible razor blade   Hemostasis achieved with: aluminum chloride and electrodesiccation   Outcome: patient tolerated procedure well   Post-procedure details: wound care instructions given    Destruction of lesion Complexity: simple   Destruction method: electrodesiccation and curettage   Informed consent: discussed and consent obtained   Timeout:  patient name, date of birth, surgical site, and procedure verified Anesthesia: the lesion was anesthetized in a standard fashion   Anesthetic:  1% lidocaine w/ epinephrine 1-100,000 local infiltration Curettage performed in three different directions: Yes   Electrodesiccation performed over the curetted area: Yes   Curettage cycles:  3 Lesion length (cm):  1.1 Lesion width (cm):  1.1 Margin per side (cm):  0.1 Final wound size (cm):  1.3 Hemostasis achieved with:  aluminum chloride Outcome: patient tolerated procedure well with no complications   Post-procedure details: wound care instructions given    Specimen 3 - Surgical pathology Differential Diagnosis:scc Check Margins: No  History of SCC (squamous cell carcinoma) of skin Mid Forehead Lentigines - Scattered tan macules - Discussed due to sun exposure -  Benign, observe - Call for any changes  Seborrheic Keratoses - Stuck-on, waxy, tan-brown papules and plaques  - Discussed benign etiology and prognosis. - Observe - Call for any changes  Melanocytic Nevi - Tan-brown and/or pink-flesh-colored symmetric macules and papules - Benign appearing on exam today - Observation - Call clinic for new or changing moles - Recommend daily use of broad spectrum spf 30+ sunscreen to sun-exposed areas.   Hemangiomas - Red papules - Discussed benign nature - Observe - Call for any changes  Actinic Damage - diffuse scaly erythematous macules with underlying dyspigmentation - Recommend daily broad spectrum sunscreen SPF 30+ to sun-exposed areas, reapply every 2 hours as needed.  - Call for new or changing lesions.  Skin cancer screening performed today.   I, Blannie Shedlock, PA-C, have reviewed all documentation's for this visit.  The documentation on 04/08/20 for the exam, diagnosis, procedures and orders are all accurate and complete.

## 2020-03-28 ENCOUNTER — Ambulatory Visit: Payer: Medicare Other | Admitting: Physician Assistant

## 2020-03-28 DIAGNOSIS — R232 Flushing: Secondary | ICD-10-CM | POA: Diagnosis not present

## 2020-04-17 DIAGNOSIS — N312 Flaccid neuropathic bladder, not elsewhere classified: Secondary | ICD-10-CM | POA: Diagnosis not present

## 2020-04-17 DIAGNOSIS — N3020 Other chronic cystitis without hematuria: Secondary | ICD-10-CM | POA: Diagnosis not present

## 2020-04-17 DIAGNOSIS — N200 Calculus of kidney: Secondary | ICD-10-CM | POA: Diagnosis not present

## 2020-04-17 DIAGNOSIS — R972 Elevated prostate specific antigen [PSA]: Secondary | ICD-10-CM | POA: Diagnosis not present

## 2020-04-17 DIAGNOSIS — N401 Enlarged prostate with lower urinary tract symptoms: Secondary | ICD-10-CM | POA: Diagnosis not present

## 2020-04-20 ENCOUNTER — Ambulatory Visit: Payer: Medicare Other | Admitting: Physician Assistant

## 2020-05-02 DIAGNOSIS — R339 Retention of urine, unspecified: Secondary | ICD-10-CM | POA: Diagnosis not present

## 2020-05-03 DIAGNOSIS — H5203 Hypermetropia, bilateral: Secondary | ICD-10-CM | POA: Diagnosis not present

## 2020-05-03 DIAGNOSIS — H524 Presbyopia: Secondary | ICD-10-CM | POA: Diagnosis not present

## 2020-05-03 DIAGNOSIS — H52203 Unspecified astigmatism, bilateral: Secondary | ICD-10-CM | POA: Diagnosis not present

## 2020-05-03 DIAGNOSIS — Z961 Presence of intraocular lens: Secondary | ICD-10-CM | POA: Diagnosis not present

## 2020-06-02 DIAGNOSIS — E039 Hypothyroidism, unspecified: Secondary | ICD-10-CM | POA: Diagnosis not present

## 2020-06-08 DIAGNOSIS — R200 Anesthesia of skin: Secondary | ICD-10-CM | POA: Diagnosis not present

## 2020-06-08 DIAGNOSIS — F411 Generalized anxiety disorder: Secondary | ICD-10-CM | POA: Diagnosis not present

## 2020-06-15 DIAGNOSIS — R339 Retention of urine, unspecified: Secondary | ICD-10-CM | POA: Diagnosis not present

## 2020-06-17 DIAGNOSIS — Z23 Encounter for immunization: Secondary | ICD-10-CM | POA: Diagnosis not present

## 2020-07-03 DIAGNOSIS — G5600 Carpal tunnel syndrome, unspecified upper limb: Secondary | ICD-10-CM | POA: Diagnosis not present

## 2020-07-03 DIAGNOSIS — G629 Polyneuropathy, unspecified: Secondary | ICD-10-CM | POA: Diagnosis not present

## 2020-08-06 ENCOUNTER — Other Ambulatory Visit: Payer: Self-pay | Admitting: Cardiovascular Disease

## 2020-08-21 DIAGNOSIS — M25552 Pain in left hip: Secondary | ICD-10-CM | POA: Diagnosis not present

## 2020-08-22 DIAGNOSIS — M545 Low back pain, unspecified: Secondary | ICD-10-CM | POA: Diagnosis not present

## 2020-08-22 DIAGNOSIS — M79605 Pain in left leg: Secondary | ICD-10-CM | POA: Diagnosis not present

## 2020-08-23 ENCOUNTER — Other Ambulatory Visit: Payer: Self-pay | Admitting: Physician Assistant

## 2020-08-23 DIAGNOSIS — R339 Retention of urine, unspecified: Secondary | ICD-10-CM | POA: Diagnosis not present

## 2020-08-29 DIAGNOSIS — M5459 Other low back pain: Secondary | ICD-10-CM | POA: Diagnosis not present

## 2020-08-29 DIAGNOSIS — M79605 Pain in left leg: Secondary | ICD-10-CM | POA: Diagnosis not present

## 2020-08-30 DIAGNOSIS — M5416 Radiculopathy, lumbar region: Secondary | ICD-10-CM | POA: Diagnosis not present

## 2020-08-31 ENCOUNTER — Other Ambulatory Visit: Payer: Self-pay | Admitting: Cardiovascular Disease

## 2020-08-31 DIAGNOSIS — M545 Low back pain, unspecified: Secondary | ICD-10-CM | POA: Diagnosis not present

## 2020-08-31 DIAGNOSIS — M79605 Pain in left leg: Secondary | ICD-10-CM | POA: Diagnosis not present

## 2020-08-31 DIAGNOSIS — M47896 Other spondylosis, lumbar region: Secondary | ICD-10-CM | POA: Diagnosis not present

## 2020-09-07 DIAGNOSIS — M5416 Radiculopathy, lumbar region: Secondary | ICD-10-CM | POA: Diagnosis not present

## 2020-09-19 DIAGNOSIS — M47816 Spondylosis without myelopathy or radiculopathy, lumbar region: Secondary | ICD-10-CM | POA: Diagnosis not present

## 2020-09-20 DIAGNOSIS — L738 Other specified follicular disorders: Secondary | ICD-10-CM | POA: Diagnosis not present

## 2020-09-20 DIAGNOSIS — Z85828 Personal history of other malignant neoplasm of skin: Secondary | ICD-10-CM | POA: Diagnosis not present

## 2020-09-20 DIAGNOSIS — L905 Scar conditions and fibrosis of skin: Secondary | ICD-10-CM | POA: Diagnosis not present

## 2020-09-20 DIAGNOSIS — C44622 Squamous cell carcinoma of skin of right upper limb, including shoulder: Secondary | ICD-10-CM | POA: Diagnosis not present

## 2020-09-20 DIAGNOSIS — L821 Other seborrheic keratosis: Secondary | ICD-10-CM | POA: Diagnosis not present

## 2020-09-20 DIAGNOSIS — D485 Neoplasm of uncertain behavior of skin: Secondary | ICD-10-CM | POA: Diagnosis not present

## 2020-09-20 DIAGNOSIS — C44729 Squamous cell carcinoma of skin of left lower limb, including hip: Secondary | ICD-10-CM | POA: Diagnosis not present

## 2020-09-21 DIAGNOSIS — I2510 Atherosclerotic heart disease of native coronary artery without angina pectoris: Secondary | ICD-10-CM | POA: Diagnosis not present

## 2020-09-21 DIAGNOSIS — E7800 Pure hypercholesterolemia, unspecified: Secondary | ICD-10-CM | POA: Diagnosis not present

## 2020-09-21 DIAGNOSIS — N400 Enlarged prostate without lower urinary tract symptoms: Secondary | ICD-10-CM | POA: Diagnosis not present

## 2020-09-21 DIAGNOSIS — Z0000 Encounter for general adult medical examination without abnormal findings: Secondary | ICD-10-CM | POA: Diagnosis not present

## 2020-09-29 DIAGNOSIS — M79605 Pain in left leg: Secondary | ICD-10-CM | POA: Diagnosis not present

## 2020-10-02 DIAGNOSIS — R339 Retention of urine, unspecified: Secondary | ICD-10-CM | POA: Diagnosis not present

## 2020-10-04 DIAGNOSIS — M79605 Pain in left leg: Secondary | ICD-10-CM | POA: Diagnosis not present

## 2020-10-11 DIAGNOSIS — M79605 Pain in left leg: Secondary | ICD-10-CM | POA: Diagnosis not present

## 2020-10-17 DIAGNOSIS — C44729 Squamous cell carcinoma of skin of left lower limb, including hip: Secondary | ICD-10-CM | POA: Diagnosis not present

## 2020-11-01 DIAGNOSIS — D0461 Carcinoma in situ of skin of right upper limb, including shoulder: Secondary | ICD-10-CM | POA: Diagnosis not present

## 2020-11-01 DIAGNOSIS — C44729 Squamous cell carcinoma of skin of left lower limb, including hip: Secondary | ICD-10-CM | POA: Diagnosis not present

## 2020-11-02 DIAGNOSIS — N3020 Other chronic cystitis without hematuria: Secondary | ICD-10-CM | POA: Diagnosis not present

## 2020-11-02 DIAGNOSIS — N200 Calculus of kidney: Secondary | ICD-10-CM | POA: Diagnosis not present

## 2020-11-02 DIAGNOSIS — N312 Flaccid neuropathic bladder, not elsewhere classified: Secondary | ICD-10-CM | POA: Diagnosis not present

## 2020-11-02 DIAGNOSIS — N401 Enlarged prostate with lower urinary tract symptoms: Secondary | ICD-10-CM | POA: Diagnosis not present

## 2020-12-03 ENCOUNTER — Other Ambulatory Visit: Payer: Self-pay | Admitting: Cardiovascular Disease

## 2020-12-06 ENCOUNTER — Ambulatory Visit: Payer: Medicare Other | Admitting: Physician Assistant

## 2020-12-06 ENCOUNTER — Encounter: Payer: Self-pay | Admitting: Physician Assistant

## 2020-12-06 ENCOUNTER — Other Ambulatory Visit: Payer: Self-pay

## 2020-12-06 DIAGNOSIS — Z85828 Personal history of other malignant neoplasm of skin: Secondary | ICD-10-CM

## 2020-12-06 DIAGNOSIS — D0461 Carcinoma in situ of skin of right upper limb, including shoulder: Secondary | ICD-10-CM

## 2020-12-06 DIAGNOSIS — Z1283 Encounter for screening for malignant neoplasm of skin: Secondary | ICD-10-CM | POA: Diagnosis not present

## 2020-12-06 DIAGNOSIS — C44529 Squamous cell carcinoma of skin of other part of trunk: Secondary | ICD-10-CM | POA: Diagnosis not present

## 2020-12-06 DIAGNOSIS — D485 Neoplasm of uncertain behavior of skin: Secondary | ICD-10-CM

## 2020-12-06 DIAGNOSIS — D044 Carcinoma in situ of skin of scalp and neck: Secondary | ICD-10-CM | POA: Diagnosis not present

## 2020-12-06 DIAGNOSIS — L570 Actinic keratosis: Secondary | ICD-10-CM

## 2020-12-06 NOTE — Progress Notes (Signed)
Follow-Up Visit   Subjective  James [name] is a 80 y.o. 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   He has some thick crusts that he wants checked. He has had numerous non-mole skin cancers removed. Has had numerous Mohs procedures done on his legs due to the size and severity of the lesion.   The following portions of the chart were reviewed this encounter and updated as appropriate:  Tobacco  Allergies  Meds  Problems  Med Hx  Surg Hx  Fam Hx      Objective  Well appearing patient in no apparent distress; mood and affect are within normal limits.  A full examination was performed including scalp, head, eyes, ears, nose, lips, neck, chest, axillae, abdomen, back, buttocks, bilateral upper extremities, bilateral lower extremities, hands, feet, fingers, toes, fingernails, and toenails. All findings within normal limits unless otherwise noted below.  Objective  Left Upper Back: Pink pearly papule     Objective  Right Elbow - Posterior: Pink pearly papule     Objective  Mid Parietal Scalp: Pink pearly papule     Objective  Left Parietal Scalp, Left Scaphoid Fossa, Mid Frontal Scalp: Erythematous patches with gritty scale.  Objective  head  to toe: No atypical nevi    Assessment & Plan  Neoplasm of uncertain behavior of skin (3) Left Upper Back  Skin / nail biopsy Type of biopsy: tangential   Informed consent: discussed and consent obtained   Timeout: patient name, date of birth, surgical site, and procedure verified   Procedure prep:  Patient was prepped and draped in usual sterile fashion (Non sterile) Prep type:  Chlorhexidine Anesthesia: the lesion was anesthetized in a standard fashion   Anesthetic:  1% lidocaine w/ epinephrine 1-100,000 local infiltration Instrument used: flexible razor blade   Outcome:  patient tolerated procedure well   Post-procedure details: wound care instructions given    Specimen 1 - Surgical pathology Differential Diagnosis: bcc vs scc  Check Margins: yes  Right Elbow - Posterior  Skin / nail biopsy Type of biopsy: tangential   Informed consent: discussed and consent obtained   Timeout: patient name, date of birth, surgical site, and procedure verified   Procedure prep:  Patient was prepped and draped in usual sterile fashion (Non sterile) Prep type:  Chlorhexidine Anesthesia: the lesion was anesthetized in a standard fashion   Anesthetic:  1% lidocaine w/ epinephrine 1-100,000 local infiltration Instrument used: flexible razor blade   Outcome: patient tolerated procedure well   Post-procedure details: wound care instructions given    Destruction of lesion Complexity: simple   Destruction method: electrodesiccation and curettage   Informed consent: discussed and consent obtained   Timeout:  patient name, date of birth, surgical site, and procedure verified Anesthesia: the lesion was anesthetized in a standard fashion   Anesthetic:  1% lidocaine w/ epinephrine 1-100,000 local infiltration Curettage performed in three different directions: Yes   Curettage cycles:  1 Margin per side (cm):  0.1 Final wound size (cm):  1.5 Hemostasis achieved with:  aluminum chloride Outcome: patient tolerated procedure well with no complications   Post-procedure details: wound care instructions given    Specimen 2 - Surgical pathology Differential Diagnosis: bcc vs scc- txpbx  Check Margins: No  Mid Parietal Scalp  Skin / nail biopsy Type of biopsy: tangential  Informed consent: discussed and consent obtained   Timeout: patient name, date of birth, surgical site, and procedure verified   Procedure prep:  Patient was prepped and draped in usual sterile fashion (Non sterile) Prep type:  Chlorhexidine Anesthesia: the lesion was anesthetized in a standard fashion    Anesthetic:  1% lidocaine w/ epinephrine 1-100,000 local infiltration Instrument used: flexible razor blade   Outcome: patient tolerated procedure well   Post-procedure details: wound care instructions given    Destruction of lesion Complexity: simple   Destruction method: electrodesiccation and curettage   Informed consent: discussed and consent obtained   Timeout:  patient name, date of birth, surgical site, and procedure verified Anesthesia: the lesion was anesthetized in a standard fashion   Anesthetic:  1% lidocaine w/ epinephrine 1-100,000 local infiltration Curettage performed in three different directions: Yes   Curettage cycles:  1 Margin per side (cm):  0.1 Final wound size (cm):  1 Hemostasis achieved with:  aluminum chloride Outcome: patient tolerated procedure well with no complications   Post-procedure details: wound care instructions given    Specimen 3 - Surgical pathology Differential Diagnosis: bcc vs scc-txpbx  Check Margins: No  AK (actinic keratosis) (3) Left Scaphoid Fossa; Left Parietal Scalp; Mid Frontal Scalp  Destruction of lesion - Left Parietal Scalp, Left Scaphoid Fossa, Mid Frontal Scalp Complexity: simple   Destruction method: cryotherapy   Informed consent: discussed and consent obtained   Timeout:  patient name, date of birth, surgical site, and procedure verified Lesion destroyed using liquid nitrogen: Yes   Cryotherapy cycles:  3 Outcome: patient tolerated procedure well with no complications    Encounter for screening for malignant neoplasm of skin head  to toe  Biannual skin exam    I, Arielys Wandersee, PA-C, have reviewed all documentation's for this visit.  The documentation on 12/06/20 for the exam, diagnosis, procedures and orders are all accurate and complete.

## 2020-12-06 NOTE — Patient Instructions (Signed)

## 2020-12-21 ENCOUNTER — Telehealth: Payer: Self-pay

## 2020-12-21 NOTE — Telephone Encounter (Signed)
Phone call to patient with his pathology results. Voicemail left for patient to give the office a call back.  ?

## 2020-12-21 NOTE — Telephone Encounter (Signed)
-----   Message from Warren Danes, [place] sent at 12/19/2020  1:32 PM EDT ----- Back 30 excision

## 2020-12-22 DIAGNOSIS — R339 Retention of urine, unspecified: Secondary | ICD-10-CM | POA: Diagnosis not present

## 2021-01-08 ENCOUNTER — Telehealth: Payer: Self-pay

## 2021-01-08 NOTE — Telephone Encounter (Signed)
Phone call from patient returning our call. Pathology results given to the patient.  

## 2021-01-08 NOTE — Telephone Encounter (Signed)
Phone call to patient with his pathology results. Voicemail left for patient to give the office a call back.  ?

## 2021-01-08 NOTE — Telephone Encounter (Signed)
-----   Message from Kelli R Sheffield, PA-C sent at 12/19/2020  1:32 PM EDT ----- Back 30 excision 

## 2021-02-01 ENCOUNTER — Encounter: Payer: Self-pay | Admitting: Physician Assistant

## 2021-02-01 ENCOUNTER — Other Ambulatory Visit: Payer: Self-pay

## 2021-02-01 ENCOUNTER — Ambulatory Visit: Payer: Medicare Other | Admitting: Physician Assistant

## 2021-02-01 DIAGNOSIS — C44722 Squamous cell carcinoma of skin of right lower limb, including hip: Secondary | ICD-10-CM | POA: Diagnosis not present

## 2021-02-01 DIAGNOSIS — D485 Neoplasm of uncertain behavior of skin: Secondary | ICD-10-CM

## 2021-02-01 DIAGNOSIS — Z85828 Personal history of other malignant neoplasm of skin: Secondary | ICD-10-CM

## 2021-02-01 DIAGNOSIS — C4492 Squamous cell carcinoma of skin, unspecified: Secondary | ICD-10-CM

## 2021-02-01 DIAGNOSIS — C44729 Squamous cell carcinoma of skin of left lower limb, including hip: Secondary | ICD-10-CM

## 2021-02-01 DIAGNOSIS — C44721 Squamous cell carcinoma of skin of unspecified lower limb, including hip: Secondary | ICD-10-CM

## 2021-02-01 HISTORY — DX: Squamous cell carcinoma of skin, unspecified: C44.92

## 2021-02-01 NOTE — Progress Notes (Addendum)
Follow-Up Visit   Subjective  James [name] is a 80 y.o. male who presents for the following: Skin Problem (2 new lesions on right leg x 3 weeks- growing and painful & new lesion x 3 weeks on left leg- growing and painful. Per records reviewed personal history of multiple non mole skin cancers. ).   The following portions of the chart were reviewed this encounter and updated as appropriate: Allergies  Meds  Problems  Med Hx  Surg Hx  Fam Hx       Objective  Well appearing patient in no apparent distress; mood and affect are within normal limits.  A full examination was performed including scalp, head, eyes, ears, nose, lips, neck, chest, axillae, abdomen, back, buttocks, bilateral upper extremities, bilateral lower extremities, hands, feet, fingers, toes, fingernails, and toenails. All findings within normal limits unless otherwise noted below.  Right Lower Leg - Anterior Large brown volcano lesion     Right Thigh - Anterior Volcano growth on pink base      Left Lower Leg - Anterior Volcano growth on pink base      Assessment & Plan  SCC (squamous cell carcinoma), leg, unspecified laterality (3) Right Lower Leg - Anterior  Skin / nail biopsy Type of biopsy: tangential   Informed consent: discussed and consent obtained   Timeout: patient name, date of birth, surgical site, and procedure verified   Procedure prep:  Patient was prepped and draped in usual sterile fashion (Non sterile) Prep type:  Chlorhexidine Anesthesia: the lesion was anesthetized in a standard fashion   Anesthetic:  1% lidocaine w/ epinephrine 1-100,000 local infiltration Instrument used: flexible razor blade   Outcome: patient tolerated procedure well   Post-procedure details: wound care instructions given    Destruction of lesion Complexity: simple   Destruction method: electrodesiccation and curettage   Informed consent: discussed and consent obtained   Timeout:  patient name, date of  birth, surgical site, and procedure verified Anesthesia: the lesion was anesthetized in a standard fashion   Anesthetic:  1% lidocaine w/ epinephrine 1-100,000 local infiltration Curettage performed in three different directions: Yes   Curettage cycles:  1 Margin per side (cm):  0.1 Final wound size (cm):  2.5 Hemostasis achieved with:  aluminum chloride Outcome: patient tolerated procedure well with no complications   Post-procedure details: wound care instructions given    Specimen 1 - Surgical pathology Differential Diagnosis: bcc vs scc  Check Margins: No  Right Thigh - Anterior  Skin / nail biopsy Type of biopsy: tangential   Informed consent: discussed and consent obtained   Timeout: patient name, date of birth, surgical site, and procedure verified   Procedure prep:  Patient was prepped and draped in usual sterile fashion (Non sterile) Prep type:  Chlorhexidine Anesthesia: the lesion was anesthetized in a standard fashion   Anesthetic:  1% lidocaine w/ epinephrine 1-100,000 local infiltration Instrument used: flexible razor blade   Outcome: patient tolerated procedure well   Post-procedure details: wound care instructions given    Destruction of lesion Complexity: simple   Destruction method: electrodesiccation and curettage   Informed consent: discussed and consent obtained   Timeout:  patient name, date of birth, surgical site, and procedure verified Anesthesia: the lesion was anesthetized in a standard fashion   Anesthetic:  1% lidocaine w/ epinephrine 1-100,000 local infiltration Curettage performed in three different directions: Yes   Electrodesiccation performed over the curetted area: Yes   Curettage cycles:  1 Margin per side (  cm):  0.1 Final wound size (cm):  1.3 Hemostasis achieved with:  aluminum chloride Outcome: patient tolerated procedure well with no complications   Post-procedure details: wound care instructions given    Specimen 2 - Surgical  pathology Differential Diagnosis: bcc vs scc  Check Margins: No  Left Lower Leg - Anterior  Skin / nail biopsy Type of biopsy: tangential   Informed consent: discussed and consent obtained   Timeout: patient name, date of birth, surgical site, and procedure verified   Procedure prep:  Patient was prepped and draped in usual sterile fashion (Non sterile) Prep type:  Chlorhexidine Anesthesia: the lesion was anesthetized in a standard fashion   Anesthetic:  1% lidocaine w/ epinephrine 1-100,000 local infiltration Instrument used: flexible razor blade   Outcome: patient tolerated procedure well   Post-procedure details: wound care instructions given    Destruction of lesion Complexity: simple   Destruction method: electrodesiccation and curettage   Informed consent: discussed and consent obtained   Timeout:  patient name, date of birth, surgical site, and procedure verified Anesthesia: the lesion was anesthetized in a standard fashion   Anesthetic:  1% lidocaine w/ epinephrine 1-100,000 local infiltration Curettage performed in three different directions: Yes   Electrodesiccation performed over the curetted area: Yes   Curettage cycles:  1 Margin per side (cm):  0.1 Final wound size (cm):  1.3 Hemostasis achieved with:  aluminum chloride Outcome: patient tolerated procedure well with no complications   Post-procedure details: wound care instructions given    Specimen 3 - Surgical pathology Differential Diagnosis: bcc vs scc  Check Margins: No   I, Kameron Glazebrook, PA-C, have reviewed all documentation's for this visit.  The documentation on 02/15/21 for the exam, diagnosis, procedures and orders are all accurate and complete.

## 2021-02-01 NOTE — Patient Instructions (Signed)

## 2021-02-02 ENCOUNTER — Emergency Department (HOSPITAL_COMMUNITY): Payer: Medicare Other

## 2021-02-02 ENCOUNTER — Encounter (HOSPITAL_COMMUNITY): Payer: Self-pay

## 2021-02-02 ENCOUNTER — Emergency Department (HOSPITAL_COMMUNITY)
Admission: EM | Admit: 2021-02-02 | Discharge: 2021-02-02 | Disposition: A | Discharge status: Home / Self Care | Payer: Medicare Other | Attending: Emergency Medicine | Admitting: Emergency Medicine

## 2021-02-02 ENCOUNTER — Other Ambulatory Visit: Payer: Self-pay | Admitting: Medical

## 2021-02-02 ENCOUNTER — Other Ambulatory Visit: Payer: Self-pay

## 2021-02-02 DIAGNOSIS — I5042 Chronic combined systolic (congestive) and diastolic (congestive) heart failure: Secondary | ICD-10-CM | POA: Insufficient documentation

## 2021-02-02 DIAGNOSIS — R072 Precordial pain: Secondary | ICD-10-CM | POA: Diagnosis not present

## 2021-02-02 DIAGNOSIS — I2510 Atherosclerotic heart disease of native coronary artery without angina pectoris: Secondary | ICD-10-CM | POA: Diagnosis not present

## 2021-02-02 DIAGNOSIS — I25118 Atherosclerotic heart disease of native coronary artery with other forms of angina pectoris: Secondary | ICD-10-CM | POA: Diagnosis not present

## 2021-02-02 DIAGNOSIS — I255 Ischemic cardiomyopathy: Secondary | ICD-10-CM | POA: Diagnosis not present

## 2021-02-02 DIAGNOSIS — R0789 Other chest pain: Secondary | ICD-10-CM | POA: Diagnosis not present

## 2021-02-02 DIAGNOSIS — R079 Chest pain, unspecified: Secondary | ICD-10-CM | POA: Diagnosis not present

## 2021-02-02 DIAGNOSIS — Z85828 Personal history of other malignant neoplasm of skin: Secondary | ICD-10-CM | POA: Diagnosis not present

## 2021-02-02 LAB — CBC
HCT: 43.8 % (ref 39.0–52.0)
Hemoglobin: 14.4 g/dL (ref 13.0–17.0)
MCH: 30.9 pg (ref 26.0–34.0)
MCHC: 32.9 g/dL (ref 30.0–36.0)
MCV: 94 fL (ref 80.0–100.0)
Platelets: 156 10*3/uL (ref 150–400)
RBC: 4.66 MIL/uL (ref 4.22–5.81)
RDW: 12.7 % (ref 11.5–15.5)
WBC: 6.6 10*3/uL (ref 4.0–10.5)
nRBC: 0 % (ref 0.0–0.2)

## 2021-02-02 LAB — BASIC METABOLIC PANEL
Anion gap: 8 (ref 5–15)
BUN: 21 mg/dL (ref 8–23)
CO2: 26 mmol/L (ref 22–32)
Calcium: 9.5 mg/dL (ref 8.9–10.3)
Chloride: 102 mmol/L (ref 98–111)
Creatinine, Ser: 1.05 mg/dL (ref 0.61–1.24)
GFR, Estimated: >60 mL/min (ref >60)
Glucose, Bld: 110 mg/dL — ABNORMAL HIGH (ref 70–99)
Potassium: 4.4 mmol/L (ref 3.5–5.1)
Sodium: 136 mmol/L (ref 135–145)

## 2021-02-02 LAB — TROPONIN I (HIGH SENSITIVITY)
Troponin I (High Sensitivity): 5 ng/L (ref <18)
Troponin I (High Sensitivity): 4 ng/L (ref <18)

## 2021-02-02 MED ORDER — ASPIRIN 81 MG PO CHEW
243.0000 mg | CHEWABLE_TABLET | Freq: Once | ORAL | Status: AC
  Administered 2021-02-02: 243 mg via ORAL
  Filled 2021-02-02: qty 3

## 2021-02-02 NOTE — Consult Note (Signed)
Cardiology Consultation:   Patient ID: James [name] MRN: [id number]; DOB: June 09, 1941  Admit date: 02/02/2021 Date of Consult: 02/02/2021  PCP:  Alroy Dust, L.Marlou Sa, MD   [facility] Providers Cardiologist:  Sherren Mocha, MD   {   Patient Profile:   James [name] is a 80 y.o. male with a hx of CAD with STEMI in 08/2018 with DES to LAD, ICM EF 40-45%, HFrEF, HLD, BPH, neuropathy, mixed HLD, nephrolithiasis who is being seen 02/02/2021 for the evaluation of chest pain at the request of Dr. Laverta Baltimore.  History of Present Illness:   Mr. Ashmore followed by Dr. Copper. H/o STEMI in 08/2018 with DES to LAD. Echo showed EF 40-45%, anteroseptal and apical akinesis, G1DD, mildly dilated RV. No prior tobacco or drug use. Occasional alcohol use.  Last seen 02/2020 and was doing well from a cardiac persepctive. Reported some orhtostasis symptoms, recommended good hydration and metoprolol was decreased to 12.5mg  daily.   Patient presented to the ER 02/02/21 for chest pain. started early last night. HE got up to use the bathroom around 1 or 2. Noted some left sided chest pain, it was 3/10. IT was better when he sat up, also worse with inspiration. Yesterday he mowed the lawn and had no chest pain. No SOB, nasuea, vomiting, diaphoresis. HE didn't take Nitro or Aspirin. He takes daily Aspirin. Has not Nitro at home. Not too similar when he had prior heart attack, for MI he had pain up towards neck and pain was more intense. No recent fever, chills, LLE, pnd, orthopnea. He has been taking statin, metoprolol, lisinopril.   In the ER initial HS troponin 5. Potassium 4.4, creatinine 1.05, BUN 21, WBC 6.6, Hgb 14.4. EKG showed NSR first degree AV block, TWI aVL, no acute changes. CXR negative. Patient was administered ASA 325mg  once. Cardiology consulted.    Past Medical History:  Diagnosis Date  . Basal cell carcinoma 02/04/2019   left ear rim tx with bx curet and cautery  . BPH (benign prostatic hypertrophy)    . CAD (coronary artery disease), native coronary artery    a. Cath 08/22/18 for acute anteroseptal MI s/p DES to mLAD  . Chronic combined systolic and diastolic heart failure ([place]) 09/04/2018   Ischemic CM // Ant-sept MI 12/19 tx with DES to pLAD  . Hyperlipidemia   . Kidney stone   . Prostatitis   . SCC (squamous cell carcinoma) 07/09/2007   right medial lower leg scc/ka tx cx3 41fu  . SCC (squamous cell carcinoma) 04/19/2008   right outer lower leg ka tx cx3 27fu  . SCC (squamous cell carcinoma) 03/22/2009   right forearm scc/ka tx cx3 42fu  . SCC (squamous cell carcinoma) 05/16/2009   right abdomen well diff scca tx exc  . SCC (squamous cell carcinoma) 06/07/2009   left postauricular scca/ka cx3 27fu  . SCC (squamous cell carcinoma) 04/30/2011   right upper arm scc well diff tx with bx  . SCC (squamous cell carcinoma) 01/08/2012   left inner calf scc/ka tx with bx  . SCC (squamous cell carcinoma) 04/29/2012   right hand scc/ka tx with bx  . SCC (squamous cell carcinoma) 04/29/2012   right post scalp scc mod diff adenoid cx3 89fu  . SCC (squamous cell carcinoma) 06/08/2008   left forearm scc in situ tx with bx  . SCC (squamous cell carcinoma) 03/09/2013   left lower calf scc/ka tx with bx  . SCC (squamous cell carcinoma) 05/27/2013   left outer thigh  diff scc tx with bx  . SCC (squamous cell carcinoma) 06/29/2014   left outer knee scc tx with bx  . SCC (squamous cell carcinoma) 06/29/2014   right hand scc tx with bx  . SCC (squamous cell carcinoma) 09/28/2014   mid chest well diff scc tx with bx  . SCC (squamous cell carcinoma) 09/28/2014   left shin scc/ka tx with bx  . SCC (squamous cell carcinoma) 05/30/2015   left abdomen scc well diff tx cx3 60fu  . SCC (squamous cell carcinoma) 07/25/2015   left lower leg scc/ka tx with bx  . SCC (squamous cell carcinoma) 07/25/2015   right lower leg scc/ka tx with bx  . SCC (squamous cell carcinoma) 08/29/2015   right upper arm  scc/ka tx with bx  . SCC (squamous cell carcinoma) 02/06/2016   left preauricular scc in situ tx with bx  . SCC (squamous cell carcinoma) 05/02/2016   right anterior shin scc/ka tx with bx  . SCC (squamous cell carcinoma) 06/12/2016   right anterior shin scc/ka tx with bx  . SCC (squamous cell carcinoma) 06/12/2016   left outer calf atypical scc tx with bx  . SCC (squamous cell carcinoma) 06/12/2016   right upper calf atypical scc tx with bx  . SCC (squamous cell carcinoma) 05/22/2017   left calf superior scc/ka tx with bx  . SCC (squamous cell carcinoma) 05/22/2017   left calf inferior atypial squamous tx with bx  . SCC (squamous cell carcinoma) 02/26/2018   right upper outer shin well diff scc tx with bx mohs  . SCC (squamous cell carcinoma) 02/26/2018   left lower inner shin well diff scc tx with bx mohs  . SCC (squamous cell carcinoma) 04/01/2018   left outer lower calf well diff scc tx with bx  . SCC (squamous cell carcinoma) 07/14/2018   left outer post calf scc well diff tx with bx  . SCC (squamous cell carcinoma) 09/24/2018   left calf lateral scc/ka tx with bx  . SCC (squamous cell carcinoma) 09/24/2018   left calf medial atypical squamous tx with bx  . SCC (squamous cell carcinoma) 11/18/2018   left lower post leg tx mohs  . SCC (squamous cell carcinoma) 02/04/2019   left scalp scc/ka tx with bx  . SCC (squamous cell carcinoma) 02/04/2019   right thigh scc in situ tx with bx  . SCC (squamous cell carcinoma) 05/27/2019   right post shoulder well diff scc tx with bx  . SCC (squamous cell carcinoma) 05/27/2019   left calf scc in situ 66mm punch margin free   . SCC (squamous cell carcinoma) 07/13/2019   left inner elbow well diff scc tx with bx  . SCC (squamous cell carcinoma) 09/16/2019   left hand dorsum well diff scc tx with bx  . SCC (squamous cell carcinoma) 09/16/2019   left upper arm well diff scc tx with bx  . SCC (squamous cell carcinoma) 09/16/2019   right  outer hand atypical squamous  tx with bx   . Skin cancer   . Squamous cell carcinoma of skin 07/09/2007   right pretibial  scc/ka tx cx3 75fu  . Squamous cell carcinoma of skin 09/20/2020   in situ- right posterior dorsum of hand (bx done via [place])  . Trigger finger     Past Surgical History:  Procedure Laterality Date  . CARDIAC CATHETERIZATION    . CORONARY/GRAFT ACUTE MI REVASCULARIZATION N/A 08/22/2018   Procedure: Coronary/Graft Acute MI Revascularization;  Surgeon:  Sherren Mocha, MD;  Location: [facility];  Service: Cardiovascular;  Laterality: N/A;  . LEFT HEART CATH AND CORONARY ANGIOGRAPHY N/A 08/22/2018   Procedure: LEFT HEART CATH AND CORONARY ANGIOGRAPHY;  Surgeon: Sherren Mocha, MD;  Location: [facility];  Service: Cardiovascular;  Laterality: N/A;       Inpatient Medications: Scheduled Meds:  Continuous Infusions:  PRN Meds:   Allergies:    Allergies  Allergen Reactions  . Dicyclomine Hcl Other (See Comments)    Severe stomach pain   . Sulfa Antibiotics Rash    Social History:   Social History   Socioeconomic History  . Marital status: Married    Spouse name: Baker Janus  . Number of children: Not on file  . Years of education: Not on file  . Highest education level: High school graduate  Occupational History  . Occupation: Retired  Tobacco Use  . Smoking status: Never Smoker  . Smokeless tobacco: Never Used  Vaping Use  . Vaping Use: Never used  Substance and Sexual Activity  . Alcohol use: No  . Drug use: No  . Sexual activity: Not on file  Other Topics Concern  . Not on file  Social History Narrative  . Not on file   Social Determinants of Health   Financial Resource Strain: Not on file  Food Insecurity: Not on file  Transportation Needs: Not on file  Physical Activity: Not on file  Stress: Not on file  Social Connections: Not on file  Intimate Partner Violence: Not on file    Family History:    Family History   Problem Relation Age of Onset  . Ovarian cancer Mother   . Heart disease Neg Hx      ROS:  Please see the history of present illness.  All other ROS reviewed and negative.     Physical Exam/Data:   Vitals:   02/02/21 0949 02/02/21 1124 02/02/21 1200 02/02/21 1300  BP: 138/79 129/67 113/72 128/75  Pulse: 73 (!) 58 61 72  Resp: 15 12 14  (!) 22  Temp: 98.9 F (37.2 C)     SpO2: 94% 97% 98% 97%   No intake or output data in the 24 hours ending 02/02/21 1415 Last 3 Weights 02/08/2020 07/06/2019 01/05/2019  Weight (lbs) 173 lb 174 lb 12.8 oz 171 lb 12.8 oz  Weight (kg) 78.472 kg 79.289 kg 77.928 kg     There is no height or weight on file to calculate BMI.  General:  Well nourished, well developed, in no acute distress HEENT: normal Lymph: no adenopathy Neck: no JVD Endocrine:  No thryomegaly Vascular: No carotid bruits; FA pulses 2+ bilaterally without bruits  Cardiac:  normal S1, S2; RRR; no murmur  Lungs:  clear to auscultation bilaterally, no wheezing, rhonchi or rales  Abd: soft, nontender, no hepatomegaly  Ext: no edema Musculoskeletal:  No deformities, BUE and BLE strength normal and equal Skin: warm and dry  Neuro:  CNs 2-12 intact, no focal abnormalities noted Psych:  Normal affect   EKG:  The EKG was personally reviewed and demonstrates:  NSR, first degree AV block, 66bpm, TWI aVL, LAD,  Telemetry:  Telemetry was personally reviewed and demonstrates:  SB/SR, HR upper 50s to 60s  Relevant CV Studies:  Cardiac cath 08/2018   Ost Cx to Prox Cx lesion is 25% stenosed.  Prox LAD lesion is 100% stenosed.  A drug-eluting stent was successfully placed using a STENT RESOLUTE ONYX 3.0X18.  Post intervention, there is  a 0% residual stenosis.  The left ventricular systolic function is normal.  LV end diastolic pressure is mildly elevated.  The left ventricular ejection fraction is 45-50% by visual estimate.   1.  Acute anteroseptal MI secondary to total occlusion  of the mid LAD just after the origin of the first perforator and first diagonal branches 2.  Patent RCA and left circumflex with minimal nonobstructive disease 3.  Mild LV segmental contraction abnormality with periapical hypokinesis and an LVEF estimated at 45%  Recommendations: Continue Aggrastat x6 hours, reload with ticagrelor 180 mg (patient vomited 10 minutes after initial loading dose), post MI medical therapy.  If no complications arise patient should be eligible for discharge in 48 hours  -------------------------------------------------------------------  Echo 2019 Study Conclusions   - Left ventricle: The cavity size was normal. Wall thickness was  increased in a pattern of mild LVH. Systolic function was mildly  to moderately reduced. The estimated ejection fraction was in the  range of 40% to 45%. There is akinesis of the  mid-apicalanteroseptal and apical myocardium. Doppler parameters  are consistent with abnormal left ventricular relaxation (grade 1  diastolic dysfunction).  - Right ventricle: The cavity size was mildly dilated. Wall  thickness was normal. Echo 08/2018    Laboratory Data:  High Sensitivity Troponin:   Recent Labs  Lab 02/02/21 0952  TROPONINIHS 5     Chemistry Recent Labs  Lab 02/02/21 0952  NA 136  K 4.4  CL 102  CO2 26  GLUCOSE 110*  BUN 21  CREATININE 1.05  CALCIUM 9.5  GFRNONAA >60  ANIONGAP 8    No results for input(s): PROT, ALBUMIN, AST, ALT, ALKPHOS, BILITOT in the last 168 hours. Hematology Recent Labs  Lab 02/02/21 0952  WBC 6.6  RBC 4.66  HGB 14.4  HCT 43.8  MCV 94.0  MCH 30.9  MCHC 32.9  RDW 12.7  PLT 156   BNPNo results for input(s): BNP, PROBNP in the last 168 hours.  DDimer No results for input(s): DDIMER in the last 168 hours.   Radiology/Studies:  DG Chest 2 View  Result Date: 02/02/2021 CLINICAL DATA:  Chest pain. EXAM: CHEST - 2 VIEW COMPARISON:  08/22/2018 FINDINGS: The heart  size and mediastinal contours are within normal limits. Both lungs are clear. The visualized skeletal structures are unremarkable. IMPRESSION: No active cardiopulmonary disease. Electronically Signed   By: Markus Daft M.D.   On: 02/02/2021 10:27     Assessment and Plan:   Chest pain CAD s/p DES to LAD in 2019 - presented with left sided chest pain, more atypical, somewhat pleuritic in nature. Says its different than prior MI pain. No associated sxs.  - Administered ASA 325mg  - currently chest pain free - Hs troponin 5, Second one pending - EKG without acute changes - continue home ASA 81mg  daily, metorpolol 12.5mg  daily, and lisinopril 2.5mg  daily - Needs refill of SL Nitro - If second troponin is normal I think it's reasonable to discharge patient with close cardiology follow-up. Can consider covid test given pleuritic nature of pain.  ICM HFrEF - echo 2019 showed EF 40-45% - continue metoprolol and lisinopril - Appears euvolemic on exam - not on diuretic at [place] - consider repeat echo to reassess EF, can be done as OP   HTN  - pressures wnl, continue current   HLD - continue atorvastatin - LDL 47 in 01/2020  For questions or updates, please contact [place] Please consult [email or web address] for contact info under  Signed, Khush Pasion Ninfa Meeker, PA-C  02/02/2021 2:15 PM

## 2021-02-02 NOTE — Discharge Instructions (Signed)
In the emerge department today with chest discomfort.  Your heart labs look normal and he saw the cardiology team.  They will follow with you in the clinic.  If you develop any new or worsening symptoms please return to the emergency department.[email or web address].

## 2021-02-02 NOTE — ED Triage Notes (Signed)
Pt reports left sided chest pain x2 weeks . Pt reports h/o MI in 2019. Pt denies any sob.

## 2021-02-02 NOTE — ED Notes (Signed)
Dc instructions reviewed with pt.  PT verbalized instructions  PT DC.

## 2021-02-02 NOTE — ED Provider Notes (Signed)
Emergency Department Provider Note   I have reviewed the triage vital signs and the nursing notes.   HISTORY  Chief Complaint Chest Pain   HPI James [name] is a 80 y.o. male with past medical history reviewed below including CAD with LAD DES placed in 2019 followed by Dr. Burt Knack presents to the emergency department with chest discomfort starting last night.  Symptoms have been intermittent.  He states the symptoms remind him of the "gas" pain but is only located in the left chest.  He is not having any abdominal discomfort.  No vomiting, diarrhea, diaphoresis.  States this does not feel similar to pain he was having in 2019 with his heart attack.  He dealt with pain most of the evening.  Denies shortness of breath.  He did take his 81 mg aspirin this morning but ultimately decided to present for evaluation given ongoing symptoms. He has not tried Nitro because his Rx is expired. Not having pain currently.    Past Medical History:  Diagnosis Date  . Basal cell carcinoma 02/04/2019   left ear rim tx with bx curet and cautery  . BPH (benign prostatic hypertrophy)   . CAD (coronary artery disease), native coronary artery    a. Cath 08/22/18 for acute anteroseptal MI s/p DES to mLAD  . Chronic combined systolic and diastolic heart failure ([place]) 09/04/2018   Ischemic CM // Ant-sept MI 12/19 tx with DES to pLAD  . Hyperlipidemia   . Kidney stone   . Prostatitis   . SCC (squamous cell carcinoma) 07/09/2007   right medial lower leg scc/ka tx cx3 3fu  . SCC (squamous cell carcinoma) 04/19/2008   right outer lower leg ka tx cx3 39fu  . SCC (squamous cell carcinoma) 03/22/2009   right forearm scc/ka tx cx3 36fu  . SCC (squamous cell carcinoma) 05/16/2009   right abdomen well diff scca tx exc  . SCC (squamous cell carcinoma) 06/07/2009   left postauricular scca/ka cx3 41fu  . SCC (squamous cell carcinoma) 04/30/2011   right upper arm scc well diff tx with bx  . SCC (squamous cell  carcinoma) 01/08/2012   left inner calf scc/ka tx with bx  . SCC (squamous cell carcinoma) 04/29/2012   right hand scc/ka tx with bx  . SCC (squamous cell carcinoma) 04/29/2012   right post scalp scc mod diff adenoid cx3 77fu  . SCC (squamous cell carcinoma) 06/08/2008   left forearm scc in situ tx with bx  . SCC (squamous cell carcinoma) 03/09/2013   left lower calf scc/ka tx with bx  . SCC (squamous cell carcinoma) 05/27/2013   left outer thigh diff scc tx with bx  . SCC (squamous cell carcinoma) 06/29/2014   left outer knee scc tx with bx  . SCC (squamous cell carcinoma) 06/29/2014   right hand scc tx with bx  . SCC (squamous cell carcinoma) 09/28/2014   mid chest well diff scc tx with bx  . SCC (squamous cell carcinoma) 09/28/2014   left shin scc/ka tx with bx  . SCC (squamous cell carcinoma) 05/30/2015   left abdomen scc well diff tx cx3 46fu  . SCC (squamous cell carcinoma) 07/25/2015   left lower leg scc/ka tx with bx  . SCC (squamous cell carcinoma) 07/25/2015   right lower leg scc/ka tx with bx  . SCC (squamous cell carcinoma) 08/29/2015   right upper arm scc/ka tx with bx  . SCC (squamous cell carcinoma) 02/06/2016   left preauricular scc in  situ tx with bx  . SCC (squamous cell carcinoma) 05/02/2016   right anterior shin scc/ka tx with bx  . SCC (squamous cell carcinoma) 06/12/2016   right anterior shin scc/ka tx with bx  . SCC (squamous cell carcinoma) 06/12/2016   left outer calf atypical scc tx with bx  . SCC (squamous cell carcinoma) 06/12/2016   right upper calf atypical scc tx with bx  . SCC (squamous cell carcinoma) 05/22/2017   left calf superior scc/ka tx with bx  . SCC (squamous cell carcinoma) 05/22/2017   left calf inferior atypial squamous tx with bx  . SCC (squamous cell carcinoma) 02/26/2018   right upper outer shin well diff scc tx with bx mohs  . SCC (squamous cell carcinoma) 02/26/2018   left lower inner shin well diff scc tx with bx mohs  .  SCC (squamous cell carcinoma) 04/01/2018   left outer lower calf well diff scc tx with bx  . SCC (squamous cell carcinoma) 07/14/2018   left outer post calf scc well diff tx with bx  . SCC (squamous cell carcinoma) 09/24/2018   left calf lateral scc/ka tx with bx  . SCC (squamous cell carcinoma) 09/24/2018   left calf medial atypical squamous tx with bx  . SCC (squamous cell carcinoma) 11/18/2018   left lower post leg tx mohs  . SCC (squamous cell carcinoma) 02/04/2019   left scalp scc/ka tx with bx  . SCC (squamous cell carcinoma) 02/04/2019   right thigh scc in situ tx with bx  . SCC (squamous cell carcinoma) 05/27/2019   right post shoulder well diff scc tx with bx  . SCC (squamous cell carcinoma) 05/27/2019   left calf scc in situ 64mm punch margin free   . SCC (squamous cell carcinoma) 07/13/2019   left inner elbow well diff scc tx with bx  . SCC (squamous cell carcinoma) 09/16/2019   left hand dorsum well diff scc tx with bx  . SCC (squamous cell carcinoma) 09/16/2019   left upper arm well diff scc tx with bx  . SCC (squamous cell carcinoma) 09/16/2019   right outer hand atypical squamous  tx with bx   . Skin cancer   . Squamous cell carcinoma of skin 07/09/2007   right pretibial  scc/ka tx cx3 65fu  . Squamous cell carcinoma of skin 09/20/2020   in situ- right posterior dorsum of hand (bx done via [place])  . Trigger finger     Patient Active Problem List   Diagnosis Date Noted  . CAD (coronary artery disease) 01/04/2019  . Chronic combined systolic and diastolic heart failure ([place]) 09/04/2018  . Hyperlipidemia 09/04/2018  . STEMI (ST elevation myocardial infarction) ([place]) 08/22/2018  . 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 CARDIAC CATHETERIZATION    . CORONARY/GRAFT ACUTE MI REVASCULARIZATION N/A 08/22/2018   Procedure: Coronary/Graft Acute MI Revascularization;  Surgeon: Sherren Mocha, MD;   Location: [facility];  Service: Cardiovascular;  Laterality: N/A;  . LEFT HEART CATH AND CORONARY ANGIOGRAPHY N/A 08/22/2018   Procedure: LEFT HEART CATH AND CORONARY ANGIOGRAPHY;  Surgeon: Sherren Mocha, MD;  Location: [facility];  Service: Cardiovascular;  Laterality: N/A;    Allergies Dicyclomine hcl and Sulfa antibiotics  Family History  Problem Relation Age of Onset  . Ovarian cancer Mother   . Heart disease Neg Hx     Social History Social History  Tobacco Use  . Smoking status: Never Smoker  . Smokeless tobacco: Never Used  Vaping Use  . Vaping Use: Never used  Substance Use Topics  . Alcohol use: No  . Drug use: No    Review of Systems  Constitutional: No fever/chills Eyes: No visual changes. ENT: No sore throat. Cardiovascular: Positive chest pain. Respiratory: Denies shortness of breath. Gastrointestinal: No abdominal pain.  No nausea, no vomiting.  No diarrhea.  No constipation. Genitourinary: Negative for dysuria. Musculoskeletal: Negative for back pain. Skin: Negative for rash. Neurological: Negative for headaches, focal weakness or numbness.  10-point ROS otherwise negative.  ____________________________________________   PHYSICAL EXAM:  VITAL SIGNS: ED Triage Vitals [02/02/21 0949]  Enc Vitals Group     BP 138/79     Pulse Rate 73     Resp 15     Temp 98.9 F (37.2 C)     Temp src      SpO2 94 %   Constitutional: Alert and oriented. Well appearing and in no acute distress. Eyes: Conjunctivae are normal.  Head: Atraumatic. Nose: No congestion/rhinnorhea. Mouth/Throat: Mucous membranes are moist.  Neck: No stridor.  Cardiovascular: Normal rate, regular rhythm. Good peripheral circulation. Grossly normal heart sounds.   Respiratory: Normal respiratory effort.  No retractions. Lungs CTAB. Gastrointestinal: Soft and nontender. No distention.  Musculoskeletal: No lower extremity tenderness nor edema. No gross deformities  of extremities. Neurologic:  Normal speech and language. No gross focal neurologic deficits are appreciated.  Skin:  Skin is warm, dry and intact. No rash noted.  ____________________________________________   LABS (all labs ordered are listed, but only abnormal results are displayed)  Labs Reviewed  BASIC METABOLIC PANEL - Abnormal; Notable for the following components:      Result Value   Glucose, Bld 110 (*)    All other components within normal limits  CBC  TROPONIN I (HIGH SENSITIVITY)  TROPONIN I (HIGH SENSITIVITY)   ____________________________________________  EKG   EKG Interpretation  Date/Time:  Friday Feb 02 2021 09:47:56 EDT Ventricular Rate:  66 PR Interval:  242 QRS Duration: 92 QT Interval:  396 QTC Calculation: 415 R Axis:   -40 Text Interpretation: Sinus rhythm with 1st degree A-V block Left axis deviation Septal infarct , age undetermined Abnormal ECG Confirmed by Nanda Quinton [phone]) on 02/02/2021 11:29:23 AM       ____________________________________________  RADIOLOGY  DG Chest 2 View  Result Date: 02/02/2021 CLINICAL DATA:  Chest pain. EXAM: CHEST - 2 VIEW COMPARISON:  08/22/2018 FINDINGS: The heart size and mediastinal contours are within normal limits. Both lungs are clear. The visualized skeletal structures are unremarkable. IMPRESSION: No active cardiopulmonary disease. Electronically Signed   By: Markus Daft M.D.   On: 02/02/2021 10:27    ____________________________________________   PROCEDURES  Procedure(s) performed:   Procedures  None ____________________________________________   INITIAL IMPRESSION / ASSESSMENT AND PLAN / ED COURSE  Pertinent labs & imaging results that were available during my care of the patient were reviewed by me and considered in my medical decision making (see chart for details).   Patient presents to the emergency department with pain in the chest which she describes as feeling like "gas."  No  abdominal discomfort.  Patient has no tenderness in the abdomen.  ACS is a consideration.  PE less likely.  Patient does have history of drug-eluting stent placement with cardiology and is followed but it looks like last seen 1 year prior. No angina symptoms at that time.  Initial  troponin is normal. CXR clear. Plan for Cardiology discussion.   02:00 PM  Discussed the case with cardiology who will consult on the patient.  He is currently chest pain-free.  Second troponin is ordered.  Question admit for unstable angina versus close outpatient follow-up with cardiology.   Cardiology evaluated in the ED. They will arrange outpatient stress testing. Troponin normal x 2. Pain controlled. Discussed ED return precautions.  ____________________________________________  FINAL CLINICAL IMPRESSION(S) / ED DIAGNOSES  Final diagnoses:  Precordial chest pain    MEDICATIONS GIVEN DURING THIS VISIT:  Medications  aspirin chewable tablet 243 mg (243 mg Oral Given 02/02/21 1157)    Note:  This document was prepared using Dragon voice recognition software and may include unintentional dictation errors.  Nanda Quinton, MD, [facility]    Kalaysia Demonbreun, [facility], MD 02/03/21 [phone]

## 2021-02-09 ENCOUNTER — Telehealth: Payer: Self-pay | Admitting: *Deleted

## 2021-02-09 NOTE — Telephone Encounter (Signed)
S/w pt is agreeable to appt time and date. June 29 @ 7:45 am.

## 2021-02-09 NOTE — Telephone Encounter (Signed)
-----   Message from Liliane Shi, [place] sent at 02/07/2021  5:09 PM EDT ----- Regarding: RE: ED follow-up How about 7:45 on June 29 ? ----- Message ----- From: Imagene Gurney Sent: 02/07/2021   3:43 PM EDT To: Liliane Shi, PA-C, Tamsen Snider Subject: RE: ED follow-up                               Is there anywhere to squeeze this patient on [facility] schedule? Patient only wants to see him or Dr. Burt Knack. Patient has stress test on 06/14. ----- Message ----- From: Caprice Beaver, LPN Sent: [phone]/[id number]/[id number]   3:22 PM EDT To: [facility] Scheduling Subject: FW: ED follow-up                               Please get scheduled.   Thank you!   ----- Message ----- From: Antony Madura, PA-C Sent: 02/02/2021   3:16 PM EDT To: [facility] Triage Subject: ED follow-up                                   Patient seen in the ED for chest pain, negative troponin. H/o of prior stenting so needs myoview stress test, which I ordered. Please call and arrange, and needs his annual follow-up/ED follow-up after stress test, please schedule this. Thanks

## 2021-02-15 ENCOUNTER — Encounter: Payer: Self-pay | Admitting: Physician Assistant

## 2021-02-16 ENCOUNTER — Telehealth (HOSPITAL_COMMUNITY): Payer: Self-pay | Admitting: *Deleted

## 2021-02-16 NOTE — Telephone Encounter (Signed)
Close encounter 

## 2021-02-19 ENCOUNTER — Telehealth: Payer: Self-pay | Admitting: Physician Assistant

## 2021-02-19 NOTE — Telephone Encounter (Signed)
Patient's wife left message on office voice mail that she was calling for pathology results from last visit with [facility], PA-C.

## 2021-02-20 ENCOUNTER — Other Ambulatory Visit: Payer: Self-pay

## 2021-02-20 ENCOUNTER — Ambulatory Visit (HOSPITAL_COMMUNITY)
Admission: RE | Admit: 2021-02-20 | Discharge: 2021-02-20 | Disposition: A | Discharge status: Home / Self Care | Payer: Medicare Other | Source: Ambulatory Visit | Attending: Cardiovascular Disease | Admitting: Cardiovascular Disease

## 2021-02-20 ENCOUNTER — Encounter: Payer: Self-pay | Admitting: Physician Assistant

## 2021-02-20 DIAGNOSIS — R079 Chest pain, unspecified: Secondary | ICD-10-CM | POA: Diagnosis not present

## 2021-02-20 LAB — MYOCARDIAL PERFUSION IMAGING
LV dias vol: 130 mL (ref 62–150)
LV sys vol: 62 mL
Peak HR: 93 {beats}/min
Rest HR: 61 {beats}/min
SDS: 1
SRS: 3
SSS: 4
TID: 0.9

## 2021-02-20 MED ORDER — REGADENOSON 0.4 MG/5ML IV SOLN
0.4000 mg | Freq: Once | INTRAVENOUS | Status: AC
  Administered 2021-02-20: 0.4 mg via INTRAVENOUS

## 2021-02-20 MED ORDER — TECHNETIUM TC 99M TETROFOSMIN IV KIT
10.8000 | PACK | Freq: Once | INTRAVENOUS | Status: AC | PRN
  Administered 2021-02-20: 10.8 via INTRAVENOUS
  Filled 2021-02-20: qty 11

## 2021-02-20 MED ORDER — TECHNETIUM TC 99M TETROFOSMIN IV KIT
30.6000 | PACK | Freq: Once | INTRAVENOUS | Status: AC | PRN
  Administered 2021-02-20: 30.6 via INTRAVENOUS
  Filled 2021-02-20: qty 31

## 2021-02-20 NOTE — Telephone Encounter (Signed)
Phone call to patient's wife with his pathology results. Patient's wife aware.

## 2021-02-22 ENCOUNTER — Telehealth: Payer: Self-pay | Admitting: *Deleted

## 2021-02-22 DIAGNOSIS — R339 Retention of urine, unspecified: Secondary | ICD-10-CM | POA: Diagnosis not present

## 2021-02-22 NOTE — Telephone Encounter (Signed)
-----   Message from Miller, PA-C sent at 02/22/2021  4:21 PM EDT ----- Myoview showed mildly decreased EF (45-50%, medium defect consistent with prior infarct, overall low risk, no evidence of ischemia. Continue with scheduled follow-up.

## 2021-02-22 NOTE — Telephone Encounter (Signed)
Attempted to call pt. No answer. Lmtcb.  

## 2021-02-23 DIAGNOSIS — F411 Generalized anxiety disorder: Secondary | ICD-10-CM | POA: Diagnosis not present

## 2021-02-23 DIAGNOSIS — N400 Enlarged prostate without lower urinary tract symptoms: Secondary | ICD-10-CM | POA: Diagnosis not present

## 2021-02-23 NOTE — Telephone Encounter (Signed)
I called and spoke with the patient/ his wife regarding his myoview results.   They voice understanding and are agreeable.

## 2021-02-23 NOTE — Telephone Encounter (Signed)
Patient calling to discuss recent testing results  ° °Please call  ° °

## 2021-03-05 DIAGNOSIS — R339 Retention of urine, unspecified: Secondary | ICD-10-CM | POA: Diagnosis not present

## 2021-03-05 DIAGNOSIS — N312 Flaccid neuropathic bladder, not elsewhere classified: Secondary | ICD-10-CM | POA: Diagnosis not present

## 2021-03-05 DIAGNOSIS — N200 Calculus of kidney: Secondary | ICD-10-CM | POA: Diagnosis not present

## 2021-03-05 DIAGNOSIS — N401 Enlarged prostate with lower urinary tract symptoms: Secondary | ICD-10-CM | POA: Diagnosis not present

## 2021-03-05 DIAGNOSIS — N281 Cyst of kidney, acquired: Secondary | ICD-10-CM | POA: Diagnosis not present

## 2021-03-06 DIAGNOSIS — N200 Calculus of kidney: Secondary | ICD-10-CM | POA: Diagnosis not present

## 2021-03-06 DIAGNOSIS — N281 Cyst of kidney, acquired: Secondary | ICD-10-CM | POA: Diagnosis not present

## 2021-03-06 NOTE — Progress Notes (Signed)
Cardiology Office Note:    Date:  03/07/2021   ID:  James [name], DOB [birth year], MRN [id number]  PCP:  Aurea Graff.Marlou Sa, MD   [facility] Providers Cardiologist:  Sherren Mocha, MD Cardiology APP:  Liliane Shi, PA-C     Dermatologist:  Dr. Onalee Hua / Karma Lew, PA-C  Referring MD: Alroy Dust, Carlean Jews.Marlou Sa, MD   Chief Complaint:  Hospitalization Follow-up (ED visit for chest pain )    Patient Profile:    James [name] is a 80 y.o. male with:  Coronary artery disease S/p anteroseptal STEMI 08/2018 - tx with DES to the LAD Myoview 6/22: EF 52, anterior apical MI, no ischemia; low risk Combined systolic and diastolic CHF Ischemic cardiomyopathy, EF 40-45 Hyperlipidemia BPH Nephrolithiasis    Prior CV studies: GATED SPECT MYO PERF W/LEXISCAN STRESS 1D 02/20/2021 Narrative  The left ventricular ejection fraction is mildly decreased (45-54%).  Nuclear stress EF: 52%.  Defect 1: There is a medium defect of moderate severity present in the apical anterior, apical septal, apical inferior and apex location. He has had a previous anterior wall infarction involving a wrap around LAD  Findings consistent with prior anterior apical myocardial infarction.  This is a low risk study. There is no evidence of ischemia .  Echocardiogram 08/23/2018 Mild LVH, EF 40-45, anteroseptal and apical akinesis, grade 1 diastolic dysfunction, mildly dilated RV   Cardiac catheterization 08/22/2018 LM normal LAD proximal 100-heavily thrombotic LCx ostial 25 RCA normal EF 45-50, apical hypokinesis PCI: 3 x 18 mm Resolute Onyx DES to the proximal LAD   History of Present Illness: James [name] was last seen in 6/21.  He recently went to the emergency room with chest discomfort.  He was seen by Dr. Ellyn Hack in consultation.  High-sensitivity troponins were normal.  There was no delta.  He was set up for an outpatient stress test.  This demonstrated anterior scar without ischemia.  It was  felt to be low risk.  He returns for follow-up.  He is here alone.  The chest discomfort that brought him to the emergency room with a left-sided pain that comes and goes.  It only last for couple of minutes.  It is not related to exertion.  He has no associated symptoms.  He has not been short of breath.  He has not had orthopnea or leg edema.  He has continued to have symptoms on and off.  It is not getting any worse.  He remains active.  He uses a push lawnmower to mow his yard.  He does not have symptoms with this.  He has not had syncope.  He does get lightheaded sometimes when he stands up quickly.  He brings in a list of his blood pressures.  His blood pressures are optimal at home.        Past Medical History:  Diagnosis Date   Basal cell carcinoma 02/04/2019   left ear rim tx with bx curet and cautery   BPH (benign prostatic hypertrophy)    CAD (coronary artery disease), native coronary artery    a. Cath 08/22/18 for acute anteroseptal MI s/p DES to mLAD   Chronic combined systolic and diastolic heart failure ([place]) 09/04/2018   Ischemic CM // Ant-sept MI 12/19 tx with DES to pLAD   Hyperlipidemia    Kidney stone    Prostatitis    SCC (squamous cell carcinoma) 07/09/2007   right medial lower leg scc/ka tx cx3 35fu   SCC (squamous cell carcinoma) 04/19/2008  right outer lower leg ka tx cx3 69fu   SCC (squamous cell carcinoma) 03/22/2009   right forearm scc/ka tx cx3 43fu   SCC (squamous cell carcinoma) 05/16/2009   right abdomen well diff scca tx exc   SCC (squamous cell carcinoma) 06/07/2009   left postauricular scca/ka cx3 45fu   SCC (squamous cell carcinoma) 04/30/2011   right upper arm scc well diff tx with bx   SCC (squamous cell carcinoma) 01/08/2012   left inner calf scc/ka tx with bx   SCC (squamous cell carcinoma) 04/29/2012   right hand scc/ka tx with bx   SCC (squamous cell carcinoma) 04/29/2012   right post scalp scc mod diff adenoid cx3 33fu   SCC (squamous cell  carcinoma) 06/08/2008   left forearm scc in situ tx with bx   SCC (squamous cell carcinoma) 03/09/2013   left lower calf scc/ka tx with bx   SCC (squamous cell carcinoma) 05/27/2013   left outer thigh diff scc tx with bx   SCC (squamous cell carcinoma) 06/29/2014   left outer knee scc tx with bx   SCC (squamous cell carcinoma) 06/29/2014   right hand scc tx with bx   SCC (squamous cell carcinoma) 09/28/2014   mid chest well diff scc tx with bx   SCC (squamous cell carcinoma) 09/28/2014   left shin scc/ka tx with bx   SCC (squamous cell carcinoma) 05/30/2015   left abdomen scc well diff tx cx3 43fu   SCC (squamous cell carcinoma) 07/25/2015   left lower leg scc/ka tx with bx   SCC (squamous cell carcinoma) 07/25/2015   right lower leg scc/ka tx with bx   SCC (squamous cell carcinoma) 08/29/2015   right upper arm scc/ka tx with bx   SCC (squamous cell carcinoma) 02/06/2016   left preauricular scc in situ tx with bx   SCC (squamous cell carcinoma) 05/02/2016   right anterior shin scc/ka tx with bx   SCC (squamous cell carcinoma) 06/12/2016   right anterior shin scc/ka tx with bx   SCC (squamous cell carcinoma) 06/12/2016   left outer calf atypical scc tx with bx   SCC (squamous cell carcinoma) 06/12/2016   right upper calf atypical scc tx with bx   SCC (squamous cell carcinoma) 05/22/2017   left calf superior scc/ka tx with bx   SCC (squamous cell carcinoma) 05/22/2017   left calf inferior atypial squamous tx with bx   SCC (squamous cell carcinoma) 02/26/2018   right upper outer shin well diff scc tx with bx mohs   SCC (squamous cell carcinoma) 02/26/2018   left lower inner shin well diff scc tx with bx mohs   SCC (squamous cell carcinoma) 04/01/2018   left outer lower calf well diff scc tx with bx   SCC (squamous cell carcinoma) 07/14/2018   left outer post calf scc well diff tx with bx   SCC (squamous cell carcinoma) 09/24/2018   left calf lateral scc/ka tx with bx   SCC  (squamous cell carcinoma) 09/24/2018   left calf medial atypical squamous tx with bx   SCC (squamous cell carcinoma) 11/18/2018   left lower post leg tx mohs   SCC (squamous cell carcinoma) 02/04/2019   left scalp scc/ka tx with bx   SCC (squamous cell carcinoma) 02/04/2019   right thigh scc in situ tx with bx   SCC (squamous cell carcinoma) 05/27/2019   right post shoulder well diff scc tx with bx   SCC (squamous cell carcinoma) 05/27/2019  left calf scc in situ 45mm punch margin free    SCC (squamous cell carcinoma) 07/13/2019   left inner elbow well diff scc tx with bx   SCC (squamous cell carcinoma) 09/16/2019   left hand dorsum well diff scc tx with bx   SCC (squamous cell carcinoma) 09/16/2019   left upper arm well diff scc tx with bx   SCC (squamous cell carcinoma) 09/16/2019   right outer hand atypical squamous  tx with bx    SCCA (squamous cell carcinoma) of skin 02/01/2021   Right Lower Leg - anterior (well diff) (tx p bx)   SCCA (squamous cell carcinoma) of skin 02/01/2021   Right Thigh - anterior (atypical proliferation) (tx p bx)   SCCA (squamous cell carcinoma) of skin 02/01/2021   Left Lower Leg - anterior (well diff) (tx p bx)   Skin cancer    Squamous cell carcinoma of skin 07/09/2007   right pretibial  scc/ka tx cx3 77fu   Squamous cell carcinoma of skin 09/20/2020   in situ- right posterior dorsum of hand (bx done via [place])   Trigger finger     Current Medications: Current Meds  Medication Sig   aspirin EC 81 MG tablet Take 81 mg by mouth daily. Swallow whole.   atorvastatin (LIPITOR) 80 MG tablet TAKE 1 TABLET(80 MG) BY MOUTH DAILY AT 6 PM   levothyroxine (SYNTHROID) 50 MCG tablet Take 50 mcg by mouth every morning.   lisinopril (ZESTRIL) 2.5 MG tablet TAKE 1 TABLET(2.5 MG) BY MOUTH DAILY   metoprolol succinate (TOPROL-XL) 25 MG 24 hr tablet Take 0.5 tablets (12.5 mg total) by mouth daily.   Multiple Vitamin (MULTIVITAMIN WITH MINERALS) TABS tablet Take  1 tablet by mouth daily.   nitroGLYCERIN (NITROSTAT) 0.4 MG SL tablet Place 1 tablet (0.4 mg total) under the tongue every 5 (five) minutes x 3 doses as needed for chest pain.   Omega-3 Fatty Acids (FISH OIL) 1000 MG CAPS Take 1,000 mg by mouth at bedtime.    potassium citrate (UROCIT-K) 10 MEQ (1080 MG) SR tablet Take 10 mEq by mouth 2 (two) times daily.   tamsulosin (FLOMAX) 0.4 MG CAPS capsule Take 0.4 mg by mouth 2 (two) times daily.      Allergies:   Dicyclomine hcl and Sulfa antibiotics   Social History   Tobacco Use   Smoking status: Never   Smokeless tobacco: Never  Vaping Use   Vaping Use: Never used  Substance Use Topics   Alcohol use: No   Drug use: No     Family Hx: The patient's family history includes Ovarian cancer in his mother. There is no history of Heart disease.  Review of Systems  Gastrointestinal:  Negative for dysphagia.    EKGs/Labs/Other Test Reviewed:    EKG:  EKG is not ordered today.  The ekg ordered today demonstrates n/a  Recent Labs: 02/02/2021: BUN 21; Creatinine, Ser 1.05; Hemoglobin 14.4; Platelets 156; Potassium 4.4; Sodium 136   Recent Lipid Panel Lab Results  Component Value Date/Time   CHOL 101 01/14/2020 08:40 AM   TRIG 55 01/14/2020 08:40 AM   HDL 41 01/14/2020 08:40 AM   LDLCALC 47 01/14/2020 08:40 AM   Labs obtained through [facility] - personally reviewed and interpreted: 09/21/2020: TC 123, HDL 48, LDL 62, triglycerides 56, ALT 24, TSH 1.74    Risk Assessment/Calculations:      Physical Exam:    VS:  BP 122/62   Pulse 68   Ht 5'  7" (1.702 m)   Wt 173 lb 9.6 oz (78.7 kg)   SpO2 98%   BMI 27.19 kg/m     Wt Readings from Last 3 Encounters:  03/07/21 173 lb 9.6 oz (78.7 kg)  02/20/21 172 lb (78 kg)  02/08/20 173 lb (78.5 kg)     Constitutional:      Appearance: Healthy appearance. Not in distress.  Neck:     Thyroid: No thyromegaly.     Vascular: No carotid bruit. JVD normal.  Pulmonary:     Effort: Pulmonary  effort is normal.     Breath sounds: No wheezing. No rales.  Cardiovascular:     Normal rate. Regular rhythm. Normal S1. Normal S2.      Murmurs: There is no murmur.  Pulses:    No decreased pulses.  Edema:    Peripheral edema absent.  Abdominal:     Palpations: Abdomen is soft. There is no hepatomegaly.  Skin:    General: Skin is warm and dry.  Neurological:     General: No focal deficit present.     Mental Status: Alert and oriented to person, place and time.     Cranial Nerves: Cranial nerves are intact.         ASSESSMENT & PLAN:    1. Intercostal pain His chest discomfort is noncardiac.  His symptoms or not reminiscent of angina.  He had a low risk Myoview.  When he came to the emergency room, his high sensitive troponins were normal.  No further work-up is needed at this time.  His blood pressures would not really tolerate the addition of nitrates.  At this point, I do not think he needs medical therapy for angina.  I suspect his symptoms are musculoskeletal.  He does have a lot of bloating and gas.  His symptoms may also be gastrointestinal.  We discussed using medicines like famotidine or simethicone.  He knows to contact me if his symptoms should worsen or become more reminiscent of his previous angina.  I will see him again in 6 months for reevaluation.  2. Coronary artery disease involving native coronary artery of native heart without angina pectoris Status post anteroseptal STEMI in December 2019 treated with a DES to the LAD.  Recent Myoview low risk.  He has been having some noncardiac chest pain as outlined above.  Continue current dose of aspirin, beta-blocker, atorvastatin.  3. HFmrEF (heart failure with mildly reduced EF) Ischemic cardiomyopathy.  EF improved on recent Myoview to 52.  He has been unable to tolerate higher doses of ACE inhibitor or beta-blocker in the past due to low blood pressure.  NYHA I.  Volume status is stable.  Continue current therapy.  4.  Mixed hyperlipidemia LDL optimal on most recent lab work.  Continue current Rx.        Dispo:  Return in about 6 months (around 09/06/2021) for Routine follow up in 6 months with Richardson Dopp, PA. .   Medication Adjustments/Labs and Tests Ordered: Current medicines are reviewed at length with the patient today.  Concerns regarding medicines are outlined above.  Tests Ordered: No orders of the defined types were placed in this encounter.  Medication Changes: No orders of the defined types were placed in this encounter.   Signed, Richardson Dopp, PA-C  03/07/2021 8:14 AM    [facility] [address], [place], [place]  [address] Phone: [phone]; Fax: [phone]

## 2021-03-07 ENCOUNTER — Ambulatory Visit: Payer: Medicare Other | Admitting: Physician Assistant

## 2021-03-07 ENCOUNTER — Encounter: Payer: Self-pay | Admitting: Physician Assistant

## 2021-03-07 ENCOUNTER — Other Ambulatory Visit: Payer: Self-pay

## 2021-03-07 VITALS — BP 122/62 | HR 68 | Ht 67.0 in | Wt 173.6 lb

## 2021-03-07 DIAGNOSIS — E782 Mixed hyperlipidemia: Secondary | ICD-10-CM

## 2021-03-07 DIAGNOSIS — R0782 Intercostal pain: Secondary | ICD-10-CM | POA: Diagnosis not present

## 2021-03-07 DIAGNOSIS — I5020 Unspecified systolic (congestive) heart failure: Secondary | ICD-10-CM

## 2021-03-07 DIAGNOSIS — I2510 Atherosclerotic heart disease of native coronary artery without angina pectoris: Secondary | ICD-10-CM

## 2021-03-07 NOTE — Patient Instructions (Signed)
Medication Instructions:  Your physician recommends that you continue on your current medications as directed. Please refer to the Current Medication list given to you today.  *If you need a refill on your cardiac medications before your next appointment, please call your pharmacy*   Lab Work:  -None  If you have labs (blood work) drawn today and your tests are completely normal, you will receive your results only by: [place] (if you have MyChart) OR A paper copy in the mail If you have any lab test that is abnormal or we need to change your treatment, we will call you to review the results.   Testing/Procedures:  -None   Follow-Up: At [facility], you and your health needs are our priority.  As part of our continuing mission to provide you with exceptional heart care, we have created designated Provider Care Teams.  These Care Teams include your primary Cardiologist (physician) and Advanced Practice Providers (APPs -  Physician Assistants and Nurse Practitioners) who all work together to provide you with the care you need, when you need it.  We recommend signing up for the patient portal called "MyChart".  Sign up information is provided on this After Visit Summary.  MyChart is used to connect with patients for Virtual Visits (Telemedicine).  Patients are able to view lab/test results, encounter notes, upcoming appointments, etc.  Non-urgent messages can be sent to your provider as well.   To learn more about what you can do with MyChart, go to [email or web address].    Your next appointment:   6 month(s) with Richardson Dopp, PA on Wednesday, December 7 @ 3:45 pm.   The format for your next appointment:   In Person  Provider:   Richardson Dopp, PA-C   Other Instructions  -None

## 2021-03-10 DIAGNOSIS — T83511A Infection and inflammatory reaction due to indwelling urethral catheter, initial encounter: Secondary | ICD-10-CM | POA: Diagnosis not present

## 2021-03-10 DIAGNOSIS — R300 Dysuria: Secondary | ICD-10-CM | POA: Diagnosis not present

## 2021-03-10 DIAGNOSIS — N390 Urinary tract infection, site not specified: Secondary | ICD-10-CM | POA: Diagnosis not present

## 2021-03-10 DIAGNOSIS — R350 Frequency of micturition: Secondary | ICD-10-CM | POA: Diagnosis not present

## 2021-03-20 ENCOUNTER — Ambulatory Visit: Payer: Medicare Other | Admitting: Physician Assistant

## 2021-03-20 ENCOUNTER — Other Ambulatory Visit: Payer: Self-pay

## 2021-03-20 ENCOUNTER — Encounter: Payer: Self-pay | Admitting: Physician Assistant

## 2021-03-20 DIAGNOSIS — C44722 Squamous cell carcinoma of skin of right lower limb, including hip: Secondary | ICD-10-CM

## 2021-03-20 DIAGNOSIS — L570 Actinic keratosis: Secondary | ICD-10-CM

## 2021-03-20 DIAGNOSIS — D485 Neoplasm of uncertain behavior of skin: Secondary | ICD-10-CM

## 2021-03-20 NOTE — Patient Instructions (Signed)

## 2021-03-21 ENCOUNTER — Encounter: Payer: Self-pay | Admitting: Physician Assistant

## 2021-03-21 NOTE — Progress Notes (Signed)
Follow-Up Visit   Subjective  James [name] is a 80 y.o. male who presents for the following: Annual Exam (New lesion on right lower leg x 2 & left lower leg x 1-growing and tender. Personal history of multiple non mole skin cancers. ).   The following portions of the chart were reviewed this encounter and updated as appropriate:  Tobacco  Allergies  Meds  Problems  Med Hx  Surg Hx  Fam Hx      Objective  Well appearing patient in no apparent distress; mood and affect are within normal limits.  A full examination was performed including scalp, head, eyes, ears, nose, lips, neck, chest, axillae, abdomen, back, buttocks, bilateral upper extremities, bilateral lower extremities, hands, feet, fingers, toes, fingernails, and toenails. All findings within normal limits unless otherwise noted below.  Mid Parietal Scalp (6) Erythematous patches with gritty scale.  Right Lower Leg  Superior-  Anterior [place]cano growth on pink base      Right Lower Leg Inferior - Anterior Volcano growth on pink base      Assessment & Plan  AK (actinic keratosis) (6) Mid Parietal Scalp  Destruction of lesion - Mid Parietal Scalp Complexity: simple   Destruction method: cryotherapy   Informed consent: discussed and consent obtained   Timeout:  patient name, date of birth, surgical site, and procedure verified Lesion destroyed using liquid nitrogen: Yes   Cryotherapy cycles:  3 Outcome: patient tolerated procedure well with no complications    SCC (squamous cell carcinoma), leg, right (2) Right Lower Leg  Superior-  Anterior  Skin / nail biopsy Type of biopsy: tangential   Informed consent: discussed and consent obtained   Timeout: patient name, date of birth, surgical site, and procedure verified   Procedure prep:  Patient was prepped and draped in usual sterile fashion (Non sterile) Prep type:  Chlorhexidine Anesthesia: the lesion was anesthetized in a standard fashion   Anesthetic:   1% lidocaine w/ epinephrine 1-100,000 local infiltration Instrument used: flexible razor blade   Outcome: patient tolerated procedure well   Post-procedure details: wound care instructions given    Destruction of lesion Complexity: simple   Destruction method: electrodesiccation and curettage   Informed consent: discussed and consent obtained   Timeout:  patient name, date of birth, surgical site, and procedure verified Anesthesia: the lesion was anesthetized in a standard fashion   Anesthetic:  1% lidocaine w/ epinephrine 1-100,000 local infiltration Curettage performed in three different directions: Yes   Electrodesiccation performed over the curetted area: Yes   Curettage cycles:  1 Margin per side (cm):  0.1 Final wound size (cm):  1.3 Hemostasis achieved with:  aluminum chloride Outcome: patient tolerated procedure well with no complications   Post-procedure details: wound care instructions given    Specimen 1 - Surgical pathology Differential Diagnosis: bcc vs scc-txpbx  Check Margins: No  Right Lower Leg Inferior - Anterior  Skin / nail biopsy Type of biopsy: tangential   Informed consent: discussed and consent obtained   Timeout: patient name, date of birth, surgical site, and procedure verified   Procedure prep:  Patient was prepped and draped in usual sterile fashion (Non sterile) Prep type:  Chlorhexidine Anesthesia: the lesion was anesthetized in a standard fashion   Anesthetic:  1% lidocaine w/ epinephrine 1-100,000 local infiltration Instrument used: flexible razor blade   Outcome: patient tolerated procedure well   Post-procedure details: wound care instructions given    Destruction of lesion Complexity: simple   Destruction  method: electrodesiccation and curettage   Informed consent: discussed and consent obtained   Timeout:  patient name, date of birth, surgical site, and procedure verified Anesthesia: the lesion was anesthetized in a standard fashion    Anesthetic:  1% lidocaine w/ epinephrine 1-100,000 local infiltration Curettage performed in three different directions: Yes   Electrodesiccation performed over the curetted area: Yes   Curettage cycles:  1 Margin per side (cm):  0.1 Final wound size (cm):  1.2 Hemostasis achieved with:  aluminum chloride Outcome: patient tolerated procedure well with no complications   Post-procedure details: wound care instructions given    Specimen 2 - Surgical pathology Differential Diagnosis: bcc vs scc- txpbx  Check Margins: No   I, Shantale Holtmeyer, PA-C, have reviewed all documentation's for this visit.  The documentation on 03/27/21 for the exam, diagnosis, procedures and orders are all accurate and complete.

## 2021-03-28 ENCOUNTER — Telehealth: Payer: Self-pay | Admitting: *Deleted

## 2021-03-28 NOTE — Telephone Encounter (Signed)
Pathology results to patient wife James [name]. Patient already has follow up appointment scheduled per wife.

## 2021-04-05 DIAGNOSIS — M79602 Pain in left arm: Secondary | ICD-10-CM | POA: Diagnosis not present

## 2021-04-05 DIAGNOSIS — M25511 Pain in right shoulder: Secondary | ICD-10-CM | POA: Diagnosis not present

## 2021-04-10 DIAGNOSIS — M545 Low back pain, unspecified: Secondary | ICD-10-CM | POA: Diagnosis not present

## 2021-04-19 ENCOUNTER — Other Ambulatory Visit: Payer: Self-pay

## 2021-04-19 ENCOUNTER — Encounter: Payer: Self-pay | Admitting: Physician Assistant

## 2021-04-19 ENCOUNTER — Ambulatory Visit (INDEPENDENT_AMBULATORY_CARE_PROVIDER_SITE_OTHER): Payer: Medicare Other | Admitting: Physician Assistant

## 2021-04-19 DIAGNOSIS — D045 Carcinoma in situ of skin of trunk: Secondary | ICD-10-CM

## 2021-04-19 DIAGNOSIS — C4492 Squamous cell carcinoma of skin, unspecified: Secondary | ICD-10-CM

## 2021-04-19 NOTE — Patient Instructions (Signed)

## 2021-04-25 ENCOUNTER — Encounter: Payer: Self-pay | Admitting: Physician Assistant

## 2021-04-25 NOTE — Progress Notes (Signed)
   Follow-Up Visit   Subjective  James [name] is a 80 y.o. male who presents for the following: Procedure (Patient here today for SCC x 1 on left upper back).   The following portions of the chart were reviewed this encounter and updated as appropriate:  Tobacco  Allergies  Meds  Problems  Med Hx  Surg Hx  Fam Hx      Objective  Well appearing patient in no apparent distress; mood and affect are within normal limits.  A focused examination was performed including face, neck, chest and back. Relevant physical exam findings are noted in the Assessment and Plan.  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 Upper Back  Destruction of lesion Complexity: simple   Destruction method: electrodesiccation and curettage   Informed consent: discussed and consent obtained   Timeout:  patient name, date of birth, surgical site, and procedure verified Anesthesia: the lesion was anesthetized in a standard fashion   Anesthetic:  1% lidocaine w/ epinephrine 1-100,000 local infiltration Curettage performed in three different directions: Yes   Electrodesiccation performed over the curetted area: Yes   Curettage cycles:  3 Margin per side (cm):  0.1 Final wound size (cm):  1.1 Hemostasis achieved with:  ferric subsulfate and electrodesiccation Outcome: patient tolerated procedure well with no complications   Post-procedure details: sterile dressing applied and wound care instructions given   Dressing type: bandage and petrolatum   Additional details:  Wound innoculated with 5 fluorouracil solution.    I, Julie-Ann Vanmaanen, PA-C, have reviewed all documentation's for this visit.  The documentation on 04/25/21 for the exam, diagnosis, procedures and orders are all accurate and complete.

## 2021-04-27 ENCOUNTER — Other Ambulatory Visit: Payer: Self-pay | Admitting: Cardiovascular Disease

## 2021-04-27 DIAGNOSIS — M79605 Pain in left leg: Secondary | ICD-10-CM | POA: Diagnosis not present

## 2021-05-01 DIAGNOSIS — M79605 Pain in left leg: Secondary | ICD-10-CM | POA: Diagnosis not present

## 2021-05-08 DIAGNOSIS — Z961 Presence of intraocular lens: Secondary | ICD-10-CM | POA: Diagnosis not present

## 2021-05-08 DIAGNOSIS — H524 Presbyopia: Secondary | ICD-10-CM | POA: Diagnosis not present

## 2021-05-08 DIAGNOSIS — H52203 Unspecified astigmatism, bilateral: Secondary | ICD-10-CM | POA: Diagnosis not present

## 2021-05-08 DIAGNOSIS — H5203 Hypermetropia, bilateral: Secondary | ICD-10-CM | POA: Diagnosis not present

## 2021-05-11 DIAGNOSIS — M79605 Pain in left leg: Secondary | ICD-10-CM | POA: Diagnosis not present

## 2021-05-13 ENCOUNTER — Other Ambulatory Visit: Payer: Self-pay | Admitting: Cardiovascular Disease

## 2021-05-16 DIAGNOSIS — M79605 Pain in left leg: Secondary | ICD-10-CM | POA: Diagnosis not present

## 2021-05-23 DIAGNOSIS — M79605 Pain in left leg: Secondary | ICD-10-CM | POA: Diagnosis not present

## 2021-05-23 DIAGNOSIS — R339 Retention of urine, unspecified: Secondary | ICD-10-CM | POA: Diagnosis not present

## 2021-05-31 DIAGNOSIS — M79605 Pain in left leg: Secondary | ICD-10-CM | POA: Diagnosis not present

## 2021-06-07 DIAGNOSIS — M79605 Pain in left leg: Secondary | ICD-10-CM | POA: Diagnosis not present

## 2021-06-12 ENCOUNTER — Encounter: Payer: Self-pay | Admitting: Physician Assistant

## 2021-06-12 ENCOUNTER — Ambulatory Visit: Payer: Medicare Other | Admitting: Physician Assistant

## 2021-06-12 ENCOUNTER — Other Ambulatory Visit: Payer: Self-pay

## 2021-06-12 DIAGNOSIS — Z85828 Personal history of other malignant neoplasm of skin: Secondary | ICD-10-CM | POA: Diagnosis not present

## 2021-06-12 DIAGNOSIS — L814 Other melanin hyperpigmentation: Secondary | ICD-10-CM

## 2021-06-12 DIAGNOSIS — D485 Neoplasm of uncertain behavior of skin: Secondary | ICD-10-CM

## 2021-06-12 DIAGNOSIS — L570 Actinic keratosis: Secondary | ICD-10-CM

## 2021-06-12 NOTE — Patient Instructions (Signed)

## 2021-06-13 ENCOUNTER — Encounter: Payer: Self-pay | Admitting: Physician Assistant

## 2021-06-15 DIAGNOSIS — M545 Low back pain, unspecified: Secondary | ICD-10-CM | POA: Diagnosis not present

## 2021-06-20 DIAGNOSIS — M79605 Pain in left leg: Secondary | ICD-10-CM | POA: Diagnosis not present

## 2021-06-22 NOTE — Progress Notes (Signed)
Follow-Up Visit   Subjective  James [name] is a 80 y.o. male who presents for the following: Follow-up (Patient here today for 6 month skin check. Per patient he has a lesion on his post knee x few weeks no bleeding no pain. Personal history of non mole skin cancer. No family history of atypical moles, melanoma or non mole skin cancer. ).   The following portions of the chart were reviewed this encounter and updated as appropriate:  Tobacco  Allergies  Meds  Problems  Med Hx  Surg Hx  Fam Hx      Objective  Well appearing patient in no apparent distress; mood and affect are within normal limits.  A full examination was performed including scalp, head, eyes, ears, nose, lips, neck, chest, axillae, abdomen, back, buttocks, bilateral upper extremities, bilateral lower extremities, hands, feet, fingers, toes, fingernails, and toenails. All findings within normal limits unless otherwise noted below.  Left Leg (4), Mid Parietal Scalp (4), Right Leg (3) Erythematous patches with gritty scale.  Left Lower Leg - Anterior Hyperkeratotic scale with pink base      Right Forehead Hyperkeratotic scale with pink base       Assessment & Plan  AK (actinic keratosis) (11) Mid Parietal Scalp (4); Left Leg (4); Right Leg (3)  Destruction of lesion - Left Leg, Mid Parietal Scalp, Right Leg Complexity: simple   Destruction method: cryotherapy   Informed consent: discussed and consent obtained   Timeout:  patient name, date of birth, surgical site, and procedure verified Lesion destroyed using liquid nitrogen: Yes   Cryotherapy cycles:  3 Outcome: patient tolerated procedure well with no complications   Post-procedure details: wound care instructions given    Neoplasm of uncertain behavior of skin (2) Left Lower Leg - Anterior  Skin / nail biopsy Type of biopsy: tangential   Informed consent: discussed and consent obtained   Timeout: patient name, date of birth, surgical site,  and procedure verified   Anesthesia: the lesion was anesthetized in a standard fashion   Anesthetic:  1% lidocaine w/ epinephrine 1-100,000 local infiltration Instrument used: flexible razor blade   Hemostasis achieved with: aluminum chloride and electrodesiccation   Outcome: patient tolerated procedure well   Post-procedure details: wound care instructions given    Destruction of lesion Complexity: simple   Destruction method: electrodesiccation and curettage   Informed consent: discussed and consent obtained   Timeout:  patient name, date of birth, surgical site, and procedure verified Anesthesia: the lesion was anesthetized in a standard fashion   Anesthetic:  1% lidocaine w/ epinephrine 1-100,000 local infiltration Curettage performed in three different directions: Yes   Curettage cycles:  3 Margin per side (cm):  0.1 Final wound size (cm):  2.3 Hemostasis achieved with:  aluminum chloride Outcome: patient tolerated procedure well with no complications   Post-procedure details: wound care instructions given    Specimen 1 - Surgical pathology Differential Diagnosis: bcc scc tx with bx   Check Margins: No  Right Forehead  Skin / nail biopsy Type of biopsy: tangential   Informed consent: discussed and consent obtained   Timeout: patient name, date of birth, surgical site, and procedure verified   Procedure prep:  Patient was prepped and draped in usual sterile fashion (Non sterile) Prep type:  Chlorhexidine Anesthesia: the lesion was anesthetized in a standard fashion   Anesthetic:  1% lidocaine w/ epinephrine 1-100,000 local infiltration Instrument used: flexible razor blade   Outcome: patient tolerated procedure well  Post-procedure details: wound care instructions given    Destruction of lesion Complexity: simple   Destruction method: electrodesiccation and curettage   Informed consent: discussed and consent obtained   Timeout:  patient name, date of birth, surgical  site, and procedure verified Anesthesia: the lesion was anesthetized in a standard fashion   Anesthetic:  1% lidocaine w/ epinephrine 1-100,000 local infiltration Curettage performed in three different directions: Yes   Curettage cycles:  3 Margin per side (cm):  0.1 Final wound size (cm):  1.5 Hemostasis achieved with:  aluminum chloride Outcome: patient tolerated procedure well with no complications   Post-procedure details: wound care instructions given    Specimen 2 - Surgical pathology Differential Diagnosis: bcc scc tx with bx  Check Margins: No    I, Charlot Gouin, PA-C, have reviewed all documentation's for this visit.  The documentation on 06/22/21 for the exam, diagnosis, procedures and orders are all accurate and complete.

## 2021-06-23 DIAGNOSIS — Z23 Encounter for immunization: Secondary | ICD-10-CM | POA: Diagnosis not present

## 2021-06-27 DIAGNOSIS — M79605 Pain in left leg: Secondary | ICD-10-CM | POA: Diagnosis not present

## 2021-07-01 ENCOUNTER — Other Ambulatory Visit: Payer: Self-pay | Admitting: Cardiovascular Disease

## 2021-07-02 DIAGNOSIS — M79605 Pain in left leg: Secondary | ICD-10-CM | POA: Diagnosis not present

## 2021-07-05 DIAGNOSIS — N281 Cyst of kidney, acquired: Secondary | ICD-10-CM | POA: Diagnosis not present

## 2021-07-05 DIAGNOSIS — N401 Enlarged prostate with lower urinary tract symptoms: Secondary | ICD-10-CM | POA: Diagnosis not present

## 2021-07-05 DIAGNOSIS — N200 Calculus of kidney: Secondary | ICD-10-CM | POA: Diagnosis not present

## 2021-07-05 DIAGNOSIS — N312 Flaccid neuropathic bladder, not elsewhere classified: Secondary | ICD-10-CM | POA: Diagnosis not present

## 2021-07-11 DIAGNOSIS — M79605 Pain in left leg: Secondary | ICD-10-CM | POA: Diagnosis not present

## 2021-07-18 DIAGNOSIS — M79605 Pain in left leg: Secondary | ICD-10-CM | POA: Diagnosis not present

## 2021-07-19 ENCOUNTER — Other Ambulatory Visit: Payer: Self-pay

## 2021-07-19 ENCOUNTER — Ambulatory Visit: Payer: Medicare Other | Admitting: Physician Assistant

## 2021-07-19 ENCOUNTER — Encounter: Payer: Self-pay | Admitting: Physician Assistant

## 2021-07-19 DIAGNOSIS — L821 Other seborrheic keratosis: Secondary | ICD-10-CM | POA: Diagnosis not present

## 2021-07-19 DIAGNOSIS — C44722 Squamous cell carcinoma of skin of right lower limb, including hip: Secondary | ICD-10-CM | POA: Diagnosis not present

## 2021-07-19 DIAGNOSIS — Z85828 Personal history of other malignant neoplasm of skin: Secondary | ICD-10-CM

## 2021-07-19 DIAGNOSIS — L570 Actinic keratosis: Secondary | ICD-10-CM | POA: Diagnosis not present

## 2021-07-19 DIAGNOSIS — Z1283 Encounter for screening for malignant neoplasm of skin: Secondary | ICD-10-CM | POA: Diagnosis not present

## 2021-07-19 DIAGNOSIS — D485 Neoplasm of uncertain behavior of skin: Secondary | ICD-10-CM

## 2021-07-19 DIAGNOSIS — C44729 Squamous cell carcinoma of skin of left lower limb, including hip: Secondary | ICD-10-CM

## 2021-07-19 MED ORDER — FLUOROURACIL 5 % EX CREA: TOPICAL_CREAM | CUTANEOUS | 1 refills | Status: DC

## 2021-07-19 NOTE — Patient Instructions (Signed)

## 2021-07-23 ENCOUNTER — Encounter: Payer: Self-pay | Admitting: Physician Assistant

## 2021-07-23 NOTE — Progress Notes (Signed)
Follow-Up Visit   Subjective  James [name] is a 80 y.o. male who presents for the following: Skin Problem (3 new lesion. Personal history of non mole skin cancer.).   The following portions of the chart were reviewed this encounter and updated as appropriate:  Tobacco  Allergies  Meds  Problems  Med Hx  Surg Hx  Fam Hx      Objective  Well appearing patient in no apparent distress; mood and affect are within normal limits.  A full examination was performed including scalp, head, eyes, ears, nose, lips, neck, chest, axillae, abdomen, back, buttocks, bilateral upper extremities, bilateral lower extremities, hands, feet, fingers, toes, fingernails, and toenails. All findings within normal limits unless otherwise noted below.  Left Thigh - Anterior Volcano growth on pink base        Left Lower Leg - Anterior Volcano growth on pink base        Right Lower Leg - Anterior Volcano growth on pink base         Assessment & Plan  Neoplasm of uncertain behavior of skin Left Thigh - Anterior  Skin / nail biopsy Type of biopsy: tangential   Informed consent: discussed and consent obtained   Timeout: patient name, date of birth, surgical site, and procedure verified   Procedure prep:  Patient was prepped and draped in usual sterile fashion (Non sterile) Prep type:  Chlorhexidine Anesthesia: the lesion was anesthetized in a standard fashion   Anesthetic:  1% lidocaine w/ epinephrine 1-100,000 local infiltration Instrument used: flexible razor blade   Outcome: patient tolerated procedure well   Post-procedure details: wound care instructions given    Destruction of lesion Complexity: simple   Destruction method: electrodesiccation and curettage   Informed consent: discussed and consent obtained   Timeout:  patient name, date of birth, surgical site, and procedure verified Anesthesia: the lesion was anesthetized in a standard fashion   Anesthetic:  1% lidocaine  w/ epinephrine 1-100,000 local infiltration Curettage performed in three different directions: Yes   Curettage cycles:  1 Margin per side (cm):  0.1 Final wound size (cm):  1.3 Hemostasis achieved with:  aluminum chloride Outcome: patient tolerated procedure well with no complications   Post-procedure details: wound care instructions given    fluorouracil (EFUDEX) 5 % cream Apply to affected area Monday- Sunday qhs x 2 weeks - avoid sun & expect irrigation  Specimen 1 - Surgical pathology Differential Diagnosis: bcc vs scc- txpbx  Check Margins: No  AK (actinic keratosis) (2) Right Popliteal Fossa; Left Cymba  Destruction of lesion - Left Cymba, Right Popliteal Fossa Complexity: simple   Destruction method: cryotherapy   Informed consent: discussed and consent obtained   Timeout:  patient name, date of birth, surgical site, and procedure verified Lesion destroyed using liquid nitrogen: Yes   Cryotherapy cycles:  3 Outcome: patient tolerated procedure well with no complications    SCC (squamous cell carcinoma), leg, left Left Lower Leg - Anterior  Skin / nail biopsy Type of biopsy: tangential   Informed consent: discussed and consent obtained   Timeout: patient name, date of birth, surgical site, and procedure verified   Procedure prep:  Patient was prepped and draped in usual sterile fashion (Non sterile) Prep type:  Chlorhexidine Anesthesia: the lesion was anesthetized in a standard fashion   Anesthetic:  1% lidocaine w/ epinephrine 1-100,000 local infiltration Instrument used: flexible razor blade   Outcome: patient tolerated procedure well   Post-procedure details: wound care instructions  given    Destruction of lesion Complexity: simple   Destruction method: electrodesiccation and curettage   Informed consent: discussed and consent obtained   Timeout:  patient name, date of birth, surgical site, and procedure verified Anesthesia: the lesion was anesthetized in a  standard fashion   Anesthetic:  1% lidocaine w/ epinephrine 1-100,000 local infiltration Curettage performed in three different directions: Yes   Electrodesiccation performed over the curetted area: Yes   Curettage cycles:  1 Margin per side (cm):  0.1 Final wound size (cm):  1.8 Hemostasis achieved with:  aluminum chloride Outcome: patient tolerated procedure well with no complications   Post-procedure details: wound care instructions given    fluorouracil (EFUDEX) 5 % cream Apply to affected area Monday- Sunday qhs x 2 weeks - avoid sun & expect irrigation  Specimen 2 - Surgical pathology Differential Diagnosis: bcc vs scc- txpbx  Check Margins: No  SCC (squamous cell carcinoma), leg, right Right Lower Leg - Anterior  Skin / nail biopsy Type of biopsy: tangential   Informed consent: discussed and consent obtained   Timeout: patient name, date of birth, surgical site, and procedure verified   Procedure prep:  Patient was prepped and draped in usual sterile fashion (Non sterile) Prep type:  Chlorhexidine Anesthesia: the lesion was anesthetized in a standard fashion   Anesthetic:  1% lidocaine w/ epinephrine 1-100,000 local infiltration Instrument used: flexible razor blade   Outcome: patient tolerated procedure well   Post-procedure details: wound care instructions given    Destruction of lesion Complexity: simple   Destruction method: electrodesiccation and curettage   Informed consent: discussed and consent obtained   Timeout:  patient name, date of birth, surgical site, and procedure verified Anesthesia: the lesion was anesthetized in a standard fashion   Anesthetic:  1% lidocaine w/ epinephrine 1-100,000 local infiltration Curettage performed in three different directions: Yes   Electrodesiccation performed over the curetted area: Yes   Curettage cycles:  1 Margin per side (cm):  0.1 Final wound size (cm):  1.4 Hemostasis achieved with:  aluminum chloride Outcome:  patient tolerated procedure well with no complications   Post-procedure details: wound care instructions given    fluorouracil (EFUDEX) 5 % cream Apply to affected area Monday- Sunday qhs x 2 weeks - avoid sun & expect irrigation  Specimen 3 - Surgical pathology Differential Diagnosis: bcc vs scc-txpbx  Check Margins: No    I, Siraj Dermody, PA-C, have reviewed all documentation's for this visit.  The documentation on 07/23/21 for the exam, diagnosis, procedures and orders are all accurate and complete.

## 2021-07-26 ENCOUNTER — Telehealth: Payer: Self-pay

## 2021-07-26 NOTE — Telephone Encounter (Signed)
-----   Message from Warren Danes, [place] sent at 07/23/2021  5:21 PM EST ----- RTC if recurs.

## 2021-07-26 NOTE — Telephone Encounter (Signed)
Phone call to patient with his pathology results. Patient aware of results.  

## 2021-08-06 DIAGNOSIS — M21371 Foot drop, right foot: Secondary | ICD-10-CM | POA: Diagnosis not present

## 2021-08-06 DIAGNOSIS — M21372 Foot drop, left foot: Secondary | ICD-10-CM | POA: Diagnosis not present

## 2021-08-08 DIAGNOSIS — M79605 Pain in left leg: Secondary | ICD-10-CM | POA: Diagnosis not present

## 2021-08-15 ENCOUNTER — Ambulatory Visit: Payer: Medicare Other | Admitting: Physician Assistant

## 2021-08-21 DIAGNOSIS — R339 Retention of urine, unspecified: Secondary | ICD-10-CM | POA: Diagnosis not present

## 2021-10-15 DIAGNOSIS — E7800 Pure hypercholesterolemia, unspecified: Secondary | ICD-10-CM | POA: Diagnosis not present

## 2021-10-15 DIAGNOSIS — Z0000 Encounter for general adult medical examination without abnormal findings: Secondary | ICD-10-CM | POA: Diagnosis not present

## 2021-10-15 DIAGNOSIS — E039 Hypothyroidism, unspecified: Secondary | ICD-10-CM | POA: Diagnosis not present

## 2021-10-15 DIAGNOSIS — I2510 Atherosclerotic heart disease of native coronary artery without angina pectoris: Secondary | ICD-10-CM | POA: Diagnosis not present

## 2021-10-15 DIAGNOSIS — K219 Gastro-esophageal reflux disease without esophagitis: Secondary | ICD-10-CM | POA: Diagnosis not present

## 2021-10-25 DIAGNOSIS — R109 Unspecified abdominal pain: Secondary | ICD-10-CM | POA: Diagnosis not present

## 2021-10-30 NOTE — Progress Notes (Signed)
Cardiology Office Note    Date:  11/07/2021   ID:  James [name], DOB August 03, 1941, MRN [id number]   PCP:  Aurea Graff.Marlou Sa, [facility]  Cardiologist:  Sherren Mocha, MD   Advanced Practice Provider:  Liliane Shi, PA-C Electrophysiologist:  None   [id number]}   Chief Complaint  Patient presents with   Follow-up    History of Present Illness:  James [name] is a 81 y.o. male with history of anterior septal STEMI 08/2018 treated with DES to the LAD, Myoview 02/2021 EF 52% anterior apical MI no ischemia low risk, ischemic cardiomyopathy ejection fraction 40 to 45% but improved on Myoview to 52%, hyperlipidemia, chronic combined systolic and diastolic CHF.    Patient last saw Richardson Dopp, PA-C 03/07/2021 after an ER visit for chest pain and the above stress test.  Was felt he had intercostal pain.  Patient comes in for f/u. Having a lot of prostate and bladder issues that are getting him down. Walking 30 min daily. No chest pain, dyspnea, dizziness, edema or presyncope. LDL 59 10/15/21. Getting ready to move to [facility] in [place].    Past Medical History:  Diagnosis Date   Basal cell carcinoma 02/04/2019   left ear rim tx with bx curet and cautery   BPH (benign prostatic hypertrophy)    CAD (coronary artery disease), native coronary artery    a. Cath 08/22/18 for acute anteroseptal MI s/p DES to mLAD   Chronic combined systolic and diastolic heart failure ([place]) 09/04/2018   Ischemic CM // Ant-sept MI 12/19 tx with DES to pLAD   Hyperlipidemia    Kidney stone    Prostatitis    SCC (squamous cell carcinoma) 07/09/2007   right medial lower leg scc/ka tx cx3 51fu   SCC (squamous cell carcinoma) 04/19/2008   right outer lower leg ka tx cx3 51fu   SCC (squamous cell carcinoma) 03/22/2009   right forearm scc/ka tx cx3 49fu   SCC (squamous cell carcinoma) 05/16/2009   right abdomen well diff scca tx exc   SCC (squamous cell carcinoma) 06/07/2009    left postauricular scca/ka cx3 4fu   SCC (squamous cell carcinoma) 04/30/2011   right upper arm scc well diff tx with bx   SCC (squamous cell carcinoma) 01/08/2012   left inner calf scc/ka tx with bx   SCC (squamous cell carcinoma) 04/29/2012   right hand scc/ka tx with bx   SCC (squamous cell carcinoma) 04/29/2012   right post scalp scc mod diff adenoid cx3 47fu   SCC (squamous cell carcinoma) 06/08/2008   left forearm scc in situ tx with bx   SCC (squamous cell carcinoma) 03/09/2013   left lower calf scc/ka tx with bx   SCC (squamous cell carcinoma) 05/27/2013   left outer thigh diff scc tx with bx   SCC (squamous cell carcinoma) 06/29/2014   left outer knee scc tx with bx   SCC (squamous cell carcinoma) 06/29/2014   right hand scc tx with bx   SCC (squamous cell carcinoma) 09/28/2014   mid chest well diff scc tx with bx   SCC (squamous cell carcinoma) 09/28/2014   left shin scc/ka tx with bx   SCC (squamous cell carcinoma) 05/30/2015   left abdomen scc well diff tx cx3 79fu   SCC (squamous cell carcinoma) 07/25/2015   left lower leg scc/ka tx with bx   SCC (squamous cell carcinoma) 07/25/2015   right lower leg scc/ka tx  with bx   SCC (squamous cell carcinoma) 08/29/2015   right upper arm scc/ka tx with bx   SCC (squamous cell carcinoma) 02/06/2016   left preauricular scc in situ tx with bx   SCC (squamous cell carcinoma) 05/02/2016   right anterior shin scc/ka tx with bx   SCC (squamous cell carcinoma) 06/12/2016   right anterior shin scc/ka tx with bx   SCC (squamous cell carcinoma) 06/12/2016   left outer calf atypical scc tx with bx   SCC (squamous cell carcinoma) 06/12/2016   right upper calf atypical scc tx with bx   SCC (squamous cell carcinoma) 05/22/2017   left calf superior scc/ka tx with bx   SCC (squamous cell carcinoma) 05/22/2017   left calf inferior atypial squamous tx with bx   SCC (squamous cell carcinoma) 02/26/2018   right upper outer shin well diff  scc tx with bx mohs   SCC (squamous cell carcinoma) 02/26/2018   left lower inner shin well diff scc tx with bx mohs   SCC (squamous cell carcinoma) 04/01/2018   left outer lower calf well diff scc tx with bx   SCC (squamous cell carcinoma) 07/14/2018   left outer post calf scc well diff tx with bx   SCC (squamous cell carcinoma) 09/24/2018   left calf lateral scc/ka tx with bx   SCC (squamous cell carcinoma) 09/24/2018   left calf medial atypical squamous tx with bx   SCC (squamous cell carcinoma) 11/18/2018   left lower post leg tx mohs   SCC (squamous cell carcinoma) 02/04/2019   left scalp scc/ka tx with bx   SCC (squamous cell carcinoma) 02/04/2019   right thigh scc in situ tx with bx   SCC (squamous cell carcinoma) 05/27/2019   right post shoulder well diff scc tx with bx   SCC (squamous cell carcinoma) 05/27/2019   left calf scc in situ 33mm punch margin free    SCC (squamous cell carcinoma) 07/13/2019   left inner elbow well diff scc tx with bx   SCC (squamous cell carcinoma) 09/16/2019   left hand dorsum well diff scc tx with bx   SCC (squamous cell carcinoma) 09/16/2019   left upper arm well diff scc tx with bx   SCC (squamous cell carcinoma) 09/16/2019   right outer hand atypical squamous  tx with bx    SCCA (squamous cell carcinoma) of skin 02/01/2021   Right Lower Leg - anterior (well diff) (tx p bx)   SCCA (squamous cell carcinoma) of skin 02/01/2021   Right Thigh - anterior (atypical proliferation) (tx p bx)   SCCA (squamous cell carcinoma) of skin 02/01/2021   Left Lower Leg - anterior (well diff) (tx p bx)   SCCA (squamous cell carcinoma) of skin 07/19/2021   Left Lower Leg-anterior (well diff) (tx p bx)   SCCA (squamous cell carcinoma) of skin 07/19/2021   Right Lower Leg-anterior (well diff) (tx p bx)   Skin cancer    Squamous cell carcinoma of skin 07/09/2007   right pretibial  scc/ka tx cx3 42fu   Squamous cell carcinoma of skin 09/20/2020   in situ-  right posterior dorsum of hand (bx done via [place])   Trigger finger     Past Surgical History:  Procedure Laterality Date   CARDIAC CATHETERIZATION     CORONARY/GRAFT ACUTE MI REVASCULARIZATION N/A 08/22/2018   Procedure: Coronary/Graft Acute MI Revascularization;  Surgeon: Sherren Mocha, MD;  Location: [facility];  Service: Cardiovascular;  Laterality: N/A;  LEFT HEART CATH AND CORONARY ANGIOGRAPHY N/A 08/22/2018   Procedure: LEFT HEART CATH AND CORONARY ANGIOGRAPHY;  Surgeon: Sherren Mocha, MD;  Location: [facility];  Service: Cardiovascular;  Laterality: N/A;    Current Medications: Current Meds  Medication Sig   ALPRAZolam (XANAX) 0.25 MG tablet Take 0.25 mg by mouth 3 (three) times daily as needed for anxiety.   aspirin EC 81 MG tablet Take 81 mg by mouth daily. Swallow whole.   atorvastatin (LIPITOR) 80 MG tablet TAKE 1 TABLET(80 MG) BY MOUTH DAILY AT 6 PM   fluorouracil (EFUDEX) 5 % cream Apply to affected area Monday- Sunday qhs x 2 weeks - avoid sun & expect irrigation   levothyroxine (SYNTHROID) 50 MCG tablet Take 50 mcg by mouth every morning.   lisinopril (ZESTRIL) 2.5 MG tablet TAKE 1 TABLET(2.5 MG) BY MOUTH DAILY   metoprolol succinate (TOPROL-XL) 25 MG 24 hr tablet TAKE 1/2 TABLET(12.5 MG) BY MOUTH DAILY   Multiple Vitamin (MULTIVITAMIN WITH MINERALS) TABS tablet Take 1 tablet by mouth daily.   nitroGLYCERIN (NITROSTAT) 0.4 MG SL tablet Place 1 tablet (0.4 mg total) under the tongue every 5 (five) minutes x 3 doses as needed for chest pain.   Omega-3 Fatty Acids (FISH OIL) 1000 MG CAPS Take 1,000 mg by mouth at bedtime.    potassium citrate (UROCIT-K) 10 MEQ (1080 MG) SR tablet Take 10 mEq by mouth 2 (two) times daily.   tamsulosin (FLOMAX) 0.4 MG CAPS capsule Take 0.8 mg by mouth daily.     Allergies:   Dicyclomine hcl and Sulfa antibiotics   Social History   Socioeconomic History   Marital status: Married    Spouse name: Baker Janus   Number of  children: Not on file   Years of education: Not on file   Highest education level: High school graduate  Occupational History   Occupation: Retired  Tobacco Use   Smoking status: Never    Passive exposure: Past   Smokeless tobacco: Never  Vaping Use   Vaping Use: Never used  Substance and Sexual Activity   Alcohol use: No   Drug use: No   Sexual activity: Not on file  Other Topics Concern   Not on file  Social History Narrative   Not on file   Social Determinants of Health   Financial Resource Strain: Not on file  Food Insecurity: Not on file  Transportation Needs: Not on file  Physical Activity: Not on file  Stress: Not on file  Social Connections: Not on file     Family History:  The patient's  family history includes Ovarian cancer in his mother.   ROS:   Please see the history of present illness.    ROS All other systems reviewed and are negative.   PHYSICAL EXAM:   VS:  BP 106/70    Pulse 68    Ht 5\' 7"  (1.702 m)    Wt 176 lb (79.8 kg)    SpO2 97%    BMI 27.57 kg/m   Physical Exam  GEN: Well nourished, well developed, in no acute distress  Neck: no JVD, carotid bruits, or masses Cardiac:RRR; no murmurs, rubs, or gallops  Respiratory:  clear to auscultation bilaterally, normal work of breathing GI: soft, nontender, nondistended, + BS Ext: without cyanosis, clubbing, or edema, Good distal pulses bilaterally Neuro:  Alert and Oriented x 3, Psych: euthymic mood, full affect  Wt Readings from Last 3 Encounters:  11/07/21 176 lb (79.8 kg)  03/07/21 173  lb 9.6 oz (78.7 kg)  02/20/21 172 lb (78 kg)      Studies/Labs Reviewed:   EKG:  EKG is not ordered today.     Recent Labs: 02/02/2021: BUN 21; Creatinine, Ser 1.05; Hemoglobin 14.4; Platelets 156; Potassium 4.4; Sodium 136   Lipid Panel    Component Value Date/Time   CHOL 101 01/14/2020 0840   TRIG 55 01/14/2020 0840   HDL 41 01/14/2020 0840   CHOLHDL 2.5 01/14/2020 0840   CHOLHDL 3.7 08/23/2018  0330   VLDL 16 08/23/2018 0330   LDLCALC 47 01/14/2020 0840    Additional studies/ records that were reviewed today include:  GATED SPECT MYO PERF W/LEXISCAN STRESS 1D 02/20/2021 Narrative  The left ventricular ejection fraction is mildly decreased (45-54%).  Nuclear stress EF: 52%.  Defect 1: There is a medium defect of moderate severity present in the apical anterior, apical septal, apical inferior and apex location. He has had a previous anterior wall infarction involving a wrap around LAD  Findings consistent with prior anterior apical myocardial infarction.  This is a low risk study. There is no evidence of ischemia .   Echocardiogram 08/23/2018 Mild LVH, EF 40-45, anteroseptal and apical akinesis, grade 1 diastolic dysfunction, mildly dilated RV   Cardiac catheterization 08/22/2018 LM normal LAD proximal 100-heavily thrombotic LCx ostial 25 RCA normal EF 45-50, apical hypokinesis PCI: 3 x 18 mm Resolute Onyx DES to the proximal LAD   Risk Assessment/Calculations:         ASSESSMENT:    1. Coronary artery disease involving native coronary artery of native heart without angina pectoris   2. Chronic combined systolic and diastolic CHF (congestive heart failure) ([place])   3. Mixed hyperlipidemia      PLAN:  In order of problems listed above:  CAD status post anterior septal STEMI 2019 treated with DES to the LAD no ischemia on Myoview 02/2021-no angina, continue ASA, liptor, zestril.   Combined systolic and diastolic CHF EF 40 to 73% improved on Myoview to 52%, well compensated not on diuretic. No changes  Hyperlipidemia on lipitor 80 mg daily. LDL 59 10/18/21  Shared Decision Making/Informed Consent        Medication Adjustments/Labs and Tests Ordered: Current medicines are reviewed at length with the patient today.  Concerns regarding medicines are outlined above.  Medication changes, Labs and Tests ordered today are listed in the Patient Instructions  below. Patient Instructions  Medication Instructions:  Your physician recommends that you continue on your current medications as directed. Please refer to the Current Medication list given to you today.  *If you need a refill on your cardiac medications before your next appointment, please call your pharmacy*   Lab Work: None ordered  If you have labs (blood work) drawn today and your tests are completely normal, you will receive your results only by: [place] (if you have MyChart) OR A paper copy in the mail If you have any lab test that is abnormal or we need to change your treatment, we will call you to review the results.   Testing/Procedures: None ordered   Follow-Up: At [facility], you and your health needs are our priority.  As part of our continuing mission to provide you with exceptional heart care, we have created designated Provider Care Teams.  These Care Teams include your primary Cardiologist (physician) and Advanced Practice Providers (APPs -  Physician Assistants and Nurse Practitioners) who all work together to provide you with the care you need, when  you need it.  We recommend signing up for the patient portal called "MyChart".  Sign up information is provided on this After Visit Summary.  MyChart is used to connect with patients for Virtual Visits (Telemedicine).  Patients are able to view lab/test results, encounter notes, upcoming appointments, etc.  Non-urgent messages can be sent to your provider as well.   To learn more about what you can do with MyChart, go to [email or web address].    Your next appointment:   12 month(s)  The format for your next appointment:   In Person  Provider:   Sherren Mocha, MD     Other Instructions    Signed, Ermalinda Barrios, PA-C  11/07/2021 10:36 AM    [facility] [place], [place], [place]  [address] Phone: [phone]; Fax: [phone]

## 2021-10-31 DIAGNOSIS — Z87442 Personal history of urinary calculi: Secondary | ICD-10-CM | POA: Diagnosis not present

## 2021-10-31 DIAGNOSIS — R338 Other retention of urine: Secondary | ICD-10-CM | POA: Diagnosis not present

## 2021-10-31 DIAGNOSIS — R972 Elevated prostate specific antigen [PSA]: Secondary | ICD-10-CM | POA: Diagnosis not present

## 2021-11-07 ENCOUNTER — Other Ambulatory Visit: Payer: Self-pay

## 2021-11-07 ENCOUNTER — Encounter: Payer: Self-pay | Admitting: Physician Assistant

## 2021-11-07 ENCOUNTER — Ambulatory Visit: Payer: Medicare Other | Admitting: Physician Assistant

## 2021-11-07 VITALS — BP 106/70 | HR 68 | Ht 67.0 in | Wt 176.0 lb

## 2021-11-07 DIAGNOSIS — I2510 Atherosclerotic heart disease of native coronary artery without angina pectoris: Secondary | ICD-10-CM

## 2021-11-07 DIAGNOSIS — I5042 Chronic combined systolic (congestive) and diastolic (congestive) heart failure: Secondary | ICD-10-CM

## 2021-11-07 DIAGNOSIS — E782 Mixed hyperlipidemia: Secondary | ICD-10-CM

## 2021-11-07 NOTE — Patient Instructions (Addendum)
Medication Instructions:  ?Your physician recommends that you continue on your current medications as directed. Please refer to the Current Medication list given to you today. ? ?*If you need a refill on your cardiac medications before your next appointment, please call your pharmacy* ? ? ?Lab Work: ?None ordered ? ?If you have labs (blood work) drawn today and your tests are completely normal, you will receive your results only by: ?MyChart Message (if you have MyChart) OR ?A paper copy in the mail ?If you have any lab test that is abnormal or we need to change your treatment, we will call you to review the results. ? ? ?Testing/Procedures: ?None ordered ? ? ?Follow-Up: ?At [facility], you and your health needs are our priority.  As part of our continuing mission to provide you with exceptional heart care, we have created designated Provider Care Teams.  These Care Teams include your primary Cardiologist (physician) and Advanced Practice Providers (APPs -  Physician Assistants and Nurse Practitioners) who all work together to provide you with the care you need, when you need it. ? ?We recommend signing up for the patient portal called "MyChart".  Sign up information is provided on this After Visit Summary.  MyChart is used to connect with patients for Virtual Visits (Telemedicine).  Patients are able to view lab/test results, encounter notes, upcoming appointments, etc.  Non-urgent messages can be sent to your provider as well.   ?To learn more about what you can do with MyChart, go to [email or web address].   ? ?Your next appointment:   ?12 month(s) ? ?The format for your next appointment:   ?In Person ? ?Provider:   ?Sherren Mocha, MD   ? ? ?Other Instructions ? ?

## 2021-11-08 ENCOUNTER — Encounter: Payer: Self-pay | Admitting: Physician Assistant

## 2021-11-08 ENCOUNTER — Ambulatory Visit: Payer: Medicare Other | Admitting: Physician Assistant

## 2021-11-08 DIAGNOSIS — L570 Actinic keratosis: Secondary | ICD-10-CM | POA: Diagnosis not present

## 2021-11-08 DIAGNOSIS — L820 Inflamed seborrheic keratosis: Secondary | ICD-10-CM | POA: Diagnosis not present

## 2021-11-08 DIAGNOSIS — C44729 Squamous cell carcinoma of skin of left lower limb, including hip: Secondary | ICD-10-CM | POA: Diagnosis not present

## 2021-11-08 DIAGNOSIS — D489 Neoplasm of uncertain behavior, unspecified: Secondary | ICD-10-CM

## 2021-11-08 NOTE — Telephone Encounter (Signed)
This encounter was created in error - please disregard.

## 2021-11-08 NOTE — Progress Notes (Addendum)
? ?Follow-Up Visit ?  ?Subjective  ?James [name] is a 81 y.o. male who presents for the following: Follow-up (Patient here today for follow up from 5FU use on his left lower leg. Per patient he believes treatment did well. ). ? ? ?The following portions of the chart were reviewed this encounter and updated as appropriate:  Tobacco  Allergies  Meds  Problems  Med Hx  Surg Hx  Fam Hx   ?  ? ?Objective  ?Well appearing patient in no apparent distress; mood and affect are within normal limits. ? ?A full examination was performed including scalp, head, eyes, ears, nose, lips, neck, chest, axillae, abdomen, back, buttocks, bilateral upper extremities, bilateral lower extremities, hands, feet, fingers, toes, fingernails, and toenails. All findings within normal limits unless otherwise noted below. ? ?Left Ear ?Erythematous patches with gritty scale. ? ?Left Arm (8), Right Arm (10) ?Stuck-on, tan-brown plaques on an erythematous base.  ? ?Left Thigh - Anterior ?Volcano growth on pink base  ? ? ? ? ? ? ? ?Assessment & Plan  ?AK (actinic keratosis) ?Left Ear ? ?Destruction of lesion - Left Ear ?Complexity: simple   ?Destruction method: cryotherapy   ?Informed consent: discussed and consent obtained   ?Timeout:  patient name, date of birth, surgical site, and procedure verified ?Lesion destroyed using liquid nitrogen: Yes   ?Cryotherapy cycles:  3 ?Outcome: patient tolerated procedure well with no complications   ?Post-procedure details: wound care instructions given   ? ?Seborrheic keratosis, inflamed (18) ?Left Arm (8); Right Arm (10) ? ?Destruction of lesion - Left Arm, Right Arm ?Complexity: simple   ?Destruction method: cryotherapy   ?Informed consent: discussed and consent obtained   ?Timeout:  patient name, date of birth, surgical site, and procedure verified ?Lesion destroyed using liquid nitrogen: Yes   ?Cryotherapy cycles:  3 ?Outcome: patient tolerated procedure well with no complications   ?Post-procedure  details: wound care instructions given   ? ?SCC (squamous cell carcinoma), leg, left ?Left Thigh - Anterior ? ?Skin / nail biopsy ?Type of biopsy: tangential   ?Informed consent: discussed and consent obtained   ?Timeout: patient name, date of birth, surgical site, and procedure verified   ?Anesthesia: the lesion was anesthetized in a standard fashion   ?Anesthetic:  1% lidocaine w/ epinephrine 1-100,000 local infiltration ?Instrument used: flexible razor blade   ?Hemostasis achieved with: aluminum chloride and electrodesiccation   ?Outcome: patient tolerated procedure well   ?Post-procedure details: wound care instructions given   ? ?Destruction of lesion ?Complexity: simple   ?Destruction method: electrodesiccation and curettage   ?Informed consent: discussed and consent obtained   ?Timeout:  patient name, date of birth, surgical site, and procedure verified ?Anesthesia: the lesion was anesthetized in a standard fashion   ?Anesthetic:  1% lidocaine w/ epinephrine 1-100,000 local infiltration ?Curettage performed in three different directions: Yes   ?Electrodesiccation performed over the curetted area: Yes   ?Curettage cycles:  3 ?Margin per side (cm):  0.1 ?Final wound size (cm):  1.3 ?Hemostasis achieved with:  aluminum chloride ?Outcome: patient tolerated procedure well with no complications   ?Post-procedure details: wound care instructions given   ? ?Specimen 1 - Surgical pathology ?Differential Diagnosis: bcc vs scc- txpbx ? ?Check Margins: No ? ?Related Medications ?fluorouracil (EFUDEX) 5 % cream ?Apply to affected area Monday- Sunday qhs x 2 weeks - avoid sun & expect irrigation ? ?All scars clear.  ? ?I, Kayode Petion, PA-C, have reviewed all documentation's for this visit.  The documentation  on 11/14/21 for the exam, diagnosis, procedures and orders are all accurate and complete. ?

## 2021-11-08 NOTE — Patient Instructions (Signed)

## 2021-11-13 DIAGNOSIS — F418 Other specified anxiety disorders: Secondary | ICD-10-CM | POA: Diagnosis not present

## 2021-11-13 DIAGNOSIS — M545 Low back pain, unspecified: Secondary | ICD-10-CM | POA: Diagnosis not present

## 2021-11-13 DIAGNOSIS — N400 Enlarged prostate without lower urinary tract symptoms: Secondary | ICD-10-CM | POA: Diagnosis not present

## 2021-11-14 NOTE — Progress Notes (Deleted)
? ?Follow-Up Visit ?  ?Subjective  ?James [name] is a 81 y.o. male who presents for the following: Follow-up (Patient here today for follow up from 5FU use on his left lower leg. Per patient he believes treatment did well. ). ? ? ?The following portions of the chart were reviewed this encounter and updated as appropriate:  Tobacco  Allergies  Meds  Problems  Med Hx  Surg Hx  Fam Hx   ?  ? ?Objective  ?Well appearing patient in no apparent distress; mood and affect are within normal limits. ? ?A full examination was performed including scalp, head, eyes, ears, nose, lips, neck, chest, axillae, abdomen, back, buttocks, bilateral upper extremities, bilateral lower extremities, hands, feet, fingers, toes, fingernails, and toenails. All findings within normal limits unless otherwise noted below. ? ?Left Ear ?Erythematous patches with gritty scale. ? ?Left Arm (8), Right Arm (10) ?Stuck-on, tan-brown plaques on an erythematous base.  ? ?Left Thigh - Anterior ?Volcano growth on pink base  ? ? ? ? ? ? ? ?Assessment & Plan  ?AK (actinic keratosis) ?Left Ear ? ?Destruction of lesion - Left Ear ?Complexity: simple   ?Destruction method: cryotherapy   ?Informed consent: discussed and consent obtained   ?Timeout:  patient name, date of birth, surgical site, and procedure verified ?Lesion destroyed using liquid nitrogen: Yes   ?Cryotherapy cycles:  3 ?Outcome: patient tolerated procedure well with no complications   ?Post-procedure details: wound care instructions given   ? ?Seborrheic keratosis, inflamed (18) ?Left Arm (8); Right Arm (10) ? ?Destruction of lesion - Left Arm, Right Arm ?Complexity: simple   ?Destruction method: cryotherapy   ?Informed consent: discussed and consent obtained   ?Timeout:  patient name, date of birth, surgical site, and procedure verified ?Lesion destroyed using liquid nitrogen: Yes   ?Cryotherapy cycles:  3 ?Outcome: patient tolerated procedure well with no complications   ?Post-procedure  details: wound care instructions given   ? ?SCC (squamous cell carcinoma), leg, left ?Left Thigh - Anterior ? ?Skin / nail biopsy ?Type of biopsy: tangential   ?Informed consent: discussed and consent obtained   ?Timeout: patient name, date of birth, surgical site, and procedure verified   ?Anesthesia: the lesion was anesthetized in a standard fashion   ?Anesthetic:  1% lidocaine w/ epinephrine 1-100,000 local infiltration ?Instrument used: flexible razor blade   ?Hemostasis achieved with: aluminum chloride and electrodesiccation   ?Outcome: patient tolerated procedure well   ?Post-procedure details: wound care instructions given   ? ?Destruction of lesion ?Complexity: simple   ?Destruction method: electrodesiccation and curettage   ?Informed consent: discussed and consent obtained   ?Timeout:  patient name, date of birth, surgical site, and procedure verified ?Anesthesia: the lesion was anesthetized in a standard fashion   ?Anesthetic:  1% lidocaine w/ epinephrine 1-100,000 local infiltration ?Curettage performed in three different directions: Yes   ?Electrodesiccation performed over the curetted area: Yes   ?Curettage cycles:  3 ?Margin per side (cm):  0.1 ?Final wound size (cm):  1.3 ?Hemostasis achieved with:  aluminum chloride ?Outcome: patient tolerated procedure well with no complications   ?Post-procedure details: wound care instructions given   ? ?Specimen 1 - Surgical pathology ?Differential Diagnosis: bcc vs scc- txpbx ? ?Check Margins: No ? ?Related Medications ?fluorouracil (EFUDEX) 5 % cream ?Apply to affected area Monday- Sunday qhs x 2 weeks - avoid sun & expect irrigation ? ? ? ?I, Shere Eisenhart, PA-C, have reviewed all documentation's for this visit.  The documentation on 11/14/21 for  the exam, diagnosis, procedures and orders are all accurate and complete. ?

## 2021-11-19 DIAGNOSIS — R339 Retention of urine, unspecified: Secondary | ICD-10-CM | POA: Diagnosis not present

## 2021-11-22 DIAGNOSIS — H10501 Unspecified blepharoconjunctivitis, right eye: Secondary | ICD-10-CM | POA: Diagnosis not present

## 2022-01-25 ENCOUNTER — Other Ambulatory Visit: Payer: Self-pay | Admitting: Cardiovascular Disease

## 2022-01-28 ENCOUNTER — Other Ambulatory Visit: Payer: Self-pay | Admitting: *Deleted

## 2022-01-28 MED ORDER — METOPROLOL SUCCINATE ER 25 MG PO TB24: ORAL_TABLET | ORAL | 3 refills | Status: DC

## 2022-01-28 NOTE — Telephone Encounter (Signed)
Outpatient Medication Detail   Disp Refills Start End   metoprolol succinate (TOPROL-XL) 25 MG 24 hr tablet 45 tablet 3 01/28/2022    Sig: TAKE 1/2 TABLET(12.5 MG) BY MOUTH DAILY   Sent to pharmacy as: metoprolol succinate (TOPROL-XL) 25 MG 24 hr tablet   E-Prescribing Status: Receipt confirmed by pharmacy (01/28/2022  7:14 AM EDT)    [facility], [place] - [address] AT [place]

## 2022-01-29 ENCOUNTER — Other Ambulatory Visit: Payer: Self-pay | Admitting: Cardiovascular Disease

## 2022-02-07 DIAGNOSIS — N312 Flaccid neuropathic bladder, not elsewhere classified: Secondary | ICD-10-CM | POA: Diagnosis not present

## 2022-02-07 DIAGNOSIS — N281 Cyst of kidney, acquired: Secondary | ICD-10-CM | POA: Diagnosis not present

## 2022-02-07 DIAGNOSIS — N200 Calculus of kidney: Secondary | ICD-10-CM | POA: Diagnosis not present

## 2022-02-07 DIAGNOSIS — R339 Retention of urine, unspecified: Secondary | ICD-10-CM | POA: Diagnosis not present

## 2022-02-11 ENCOUNTER — Ambulatory Visit: Payer: Medicare Other | Admitting: Dermatology

## 2022-02-13 ENCOUNTER — Ambulatory Visit: Payer: Medicare Other | Admitting: Physician Assistant

## 2022-02-20 DIAGNOSIS — R339 Retention of urine, unspecified: Secondary | ICD-10-CM | POA: Diagnosis not present

## 2022-04-02 ENCOUNTER — Encounter: Payer: Self-pay | Admitting: Physician Assistant

## 2022-04-02 ENCOUNTER — Ambulatory Visit: Payer: Medicare Other | Admitting: Physician Assistant

## 2022-04-02 DIAGNOSIS — L299 Pruritus, unspecified: Secondary | ICD-10-CM

## 2022-04-02 DIAGNOSIS — L720 Epidermal cyst: Secondary | ICD-10-CM

## 2022-04-02 DIAGNOSIS — L570 Actinic keratosis: Secondary | ICD-10-CM

## 2022-04-02 DIAGNOSIS — D0439 Carcinoma in situ of skin of other parts of face: Secondary | ICD-10-CM

## 2022-04-02 DIAGNOSIS — D485 Neoplasm of uncertain behavior of skin: Secondary | ICD-10-CM

## 2022-04-02 DIAGNOSIS — Z1283 Encounter for screening for malignant neoplasm of skin: Secondary | ICD-10-CM

## 2022-04-02 DIAGNOSIS — D0430 Carcinoma in situ of skin of unspecified part of face: Secondary | ICD-10-CM

## 2022-04-02 MED ORDER — CLOBETASOL PROP EMOLLIENT BASE 0.05 % EX CREA: TOPICAL_CREAM | CUTANEOUS | 2 refills | Status: DC

## 2022-04-02 NOTE — Patient Instructions (Signed)

## 2022-04-05 ENCOUNTER — Encounter: Payer: Self-pay | Admitting: Physician Assistant

## 2022-04-05 NOTE — Progress Notes (Signed)
Follow-Up Visit   Subjective  James [name] is a 81 y.o. male who presents for the following: Follow-up (Patient here today to have multiple lesions checked on his body x no bleeding, no pain from any of the lesions. ).   The following portions of the chart were reviewed this encounter and updated as appropriate:  Tobacco  Allergies  Meds  Problems  Med Hx  Surg Hx  Fam Hx      Objective  Well appearing patient in no apparent distress; mood and affect are within normal limits.  A focused examination was performed including waist up and legs. Relevant physical exam findings are noted in the Assessment and Plan.  Full body skin examination-No atypical nevi noted at the time of the visit.   Right Mid Helix Hyperkeratotic scale with pink base        Right Buccal Cheek Hyperkeratotic scale with pink base        Left Lower Leg - Posterior, Right Lower Leg - Posterior Pink scale   Assessment & Plan  Encounter for screening for malignant neoplasm of skin  Quarterly skin examinations  Neoplasm of uncertain behavior of skin Right Mid Helix  Skin / nail biopsy Type of biopsy: tangential   Informed consent: discussed and consent obtained   Timeout: patient name, date of birth, surgical site, and procedure verified   Procedure prep:  Patient was prepped and draped in usual sterile fashion (Non sterile) Prep type:  Chlorhexidine Anesthesia: the lesion was anesthetized in a standard fashion   Anesthetic:  1% lidocaine w/ epinephrine 1-100,000 local infiltration Instrument used: flexible razor blade   Outcome: patient tolerated procedure well   Post-procedure details: wound care instructions given    Destruction of lesion Complexity: simple   Destruction method: electrodesiccation and curettage   Informed consent: discussed and consent obtained   Timeout:  patient name, date of birth, surgical site, and procedure verified Anesthesia: the lesion was  anesthetized in a standard fashion   Anesthetic:  1% lidocaine w/ epinephrine 1-100,000 local infiltration Curettage performed in three different directions: Yes   Electrodesiccation performed over the curetted area: Yes   Curettage cycles:  3 Margin per side (cm):  0.1 Final wound size (cm):  1 Hemostasis achieved with:  aluminum chloride Outcome: patient tolerated procedure well with no complications   Post-procedure details: wound care instructions given    Specimen 2 - Surgical pathology Differential Diagnosis: bcc vs scc- txpbx  Check Margins: yes  Related Medications fluorouracil (EFUDEX) 5 % cream Apply to affected area Monday- Sunday qhs x 2 weeks - avoid sun & expect irrigation  Carcinoma in situ of skin of face, unspecified location Right Buccal Cheek  Skin / nail biopsy Type of biopsy: tangential   Informed consent: discussed and consent obtained   Timeout: patient name, date of birth, surgical site, and procedure verified   Procedure prep:  Patient was prepped and draped in usual sterile fashion (Non sterile) Prep type:  Chlorhexidine Anesthesia: the lesion was anesthetized in a standard fashion   Anesthetic:  1% lidocaine w/ epinephrine 1-100,000 local infiltration Instrument used: flexible razor blade   Outcome: patient tolerated procedure well   Post-procedure details: wound care instructions given    Destruction of lesion Complexity: simple   Destruction method: electrodesiccation and curettage   Informed consent: discussed and consent obtained   Timeout:  patient name, date of birth, surgical site, and procedure verified Anesthesia: the lesion was anesthetized in a standard fashion  Anesthetic:  1% lidocaine w/ epinephrine 1-100,000 local infiltration Curettage performed in three different directions: Yes   Electrodesiccation performed over the curetted area: Yes   Curettage cycles:  3 Margin per side (cm):  0.1 Final wound size (cm):  1 Hemostasis  achieved with:  aluminum chloride Outcome: patient tolerated procedure well with no complications   Post-procedure details: wound care instructions given    Specimen 1 - Surgical pathology Differential Diagnosis: bcc vs scc- txpbx  Check Margins: yes  Pruritic dermatitis (2) Left Lower Leg - Posterior; Right Lower Leg - Posterior  Clobetasol Prop Emollient Base (CLOBETASOL PROPIONATE E) 0.05 % emollient cream - Left Lower Leg - Posterior, Right Lower Leg - Posterior Apply to affected area qd- not for face, fold or groin    I, Kavontae Pritchard, PA-C, have reviewed all documentation's for this visit.  The documentation on 04/05/22 for the exam, diagnosis, procedures and orders are all accurate and complete.

## 2022-04-09 ENCOUNTER — Telehealth: Payer: Self-pay | Admitting: *Deleted

## 2022-04-09 NOTE — Telephone Encounter (Signed)
-----   Message from Warren Danes, [place] sent at 04/05/2022 12:58 PM EDT ----- Face cis- tx with biopsy

## 2022-04-09 NOTE — Telephone Encounter (Signed)
Left patient a message regarding his results. Patient can call back with concerns. No more treatment is needed.

## 2022-04-18 DIAGNOSIS — G4700 Insomnia, unspecified: Secondary | ICD-10-CM | POA: Diagnosis not present

## 2022-04-18 DIAGNOSIS — F418 Other specified anxiety disorders: Secondary | ICD-10-CM | POA: Diagnosis not present

## 2022-04-25 ENCOUNTER — Other Ambulatory Visit: Payer: Self-pay | Admitting: Cardiovascular Disease

## 2022-05-15 DIAGNOSIS — Z961 Presence of intraocular lens: Secondary | ICD-10-CM | POA: Diagnosis not present

## 2022-05-15 DIAGNOSIS — H5203 Hypermetropia, bilateral: Secondary | ICD-10-CM | POA: Diagnosis not present

## 2022-05-15 DIAGNOSIS — H524 Presbyopia: Secondary | ICD-10-CM | POA: Diagnosis not present

## 2022-05-15 DIAGNOSIS — H04123 Dry eye syndrome of bilateral lacrimal glands: Secondary | ICD-10-CM | POA: Diagnosis not present

## 2022-05-17 DIAGNOSIS — F418 Other specified anxiety disorders: Secondary | ICD-10-CM | POA: Diagnosis not present

## 2022-05-17 DIAGNOSIS — R519 Headache, unspecified: Secondary | ICD-10-CM | POA: Diagnosis not present

## 2022-05-17 DIAGNOSIS — G4700 Insomnia, unspecified: Secondary | ICD-10-CM | POA: Diagnosis not present

## 2022-05-17 DIAGNOSIS — R350 Frequency of micturition: Secondary | ICD-10-CM | POA: Diagnosis not present

## 2022-05-21 DIAGNOSIS — R208 Other disturbances of skin sensation: Secondary | ICD-10-CM | POA: Diagnosis not present

## 2022-05-21 DIAGNOSIS — N3090 Cystitis, unspecified without hematuria: Secondary | ICD-10-CM | POA: Diagnosis not present

## 2022-06-03 DIAGNOSIS — F411 Generalized anxiety disorder: Secondary | ICD-10-CM | POA: Diagnosis not present

## 2022-06-03 DIAGNOSIS — R232 Flushing: Secondary | ICD-10-CM | POA: Diagnosis not present

## 2022-06-03 DIAGNOSIS — E039 Hypothyroidism, unspecified: Secondary | ICD-10-CM | POA: Diagnosis not present

## 2022-06-19 DIAGNOSIS — F418 Other specified anxiety disorders: Secondary | ICD-10-CM | POA: Diagnosis not present

## 2022-06-19 DIAGNOSIS — L989 Disorder of the skin and subcutaneous tissue, unspecified: Secondary | ICD-10-CM | POA: Diagnosis not present

## 2022-06-29 DIAGNOSIS — Z23 Encounter for immunization: Secondary | ICD-10-CM | POA: Diagnosis not present

## 2022-09-12 DIAGNOSIS — N200 Calculus of kidney: Secondary | ICD-10-CM | POA: Diagnosis not present

## 2022-09-12 DIAGNOSIS — N312 Flaccid neuropathic bladder, not elsewhere classified: Secondary | ICD-10-CM | POA: Diagnosis not present

## 2022-09-12 DIAGNOSIS — N281 Cyst of kidney, acquired: Secondary | ICD-10-CM | POA: Diagnosis not present

## 2022-09-12 DIAGNOSIS — R339 Retention of urine, unspecified: Secondary | ICD-10-CM | POA: Diagnosis not present

## 2022-09-24 DIAGNOSIS — L989 Disorder of the skin and subcutaneous tissue, unspecified: Secondary | ICD-10-CM | POA: Diagnosis not present

## 2022-09-24 DIAGNOSIS — F411 Generalized anxiety disorder: Secondary | ICD-10-CM | POA: Diagnosis not present

## 2022-09-24 DIAGNOSIS — G5600 Carpal tunnel syndrome, unspecified upper limb: Secondary | ICD-10-CM | POA: Diagnosis not present

## 2022-09-24 DIAGNOSIS — K148 Other diseases of tongue: Secondary | ICD-10-CM | POA: Diagnosis not present

## 2022-10-08 DIAGNOSIS — L859 Epidermal thickening, unspecified: Secondary | ICD-10-CM | POA: Diagnosis not present

## 2022-10-08 DIAGNOSIS — L989 Disorder of the skin and subcutaneous tissue, unspecified: Secondary | ICD-10-CM | POA: Diagnosis not present

## 2022-10-08 DIAGNOSIS — L820 Inflamed seborrheic keratosis: Secondary | ICD-10-CM | POA: Diagnosis not present

## 2022-10-17 DIAGNOSIS — G5602 Carpal tunnel syndrome, left upper limb: Secondary | ICD-10-CM | POA: Diagnosis not present

## 2022-10-22 ENCOUNTER — Other Ambulatory Visit: Payer: Self-pay | Admitting: Physician Assistant

## 2022-10-23 DIAGNOSIS — G5603 Carpal tunnel syndrome, bilateral upper limbs: Secondary | ICD-10-CM | POA: Diagnosis not present

## 2022-10-29 DIAGNOSIS — E039 Hypothyroidism, unspecified: Secondary | ICD-10-CM | POA: Diagnosis not present

## 2022-10-29 DIAGNOSIS — Z0000 Encounter for general adult medical examination without abnormal findings: Secondary | ICD-10-CM | POA: Diagnosis not present

## 2022-10-29 DIAGNOSIS — I25119 Atherosclerotic heart disease of native coronary artery with unspecified angina pectoris: Secondary | ICD-10-CM | POA: Diagnosis not present

## 2022-10-29 DIAGNOSIS — F3342 Major depressive disorder, recurrent, in full remission: Secondary | ICD-10-CM | POA: Diagnosis not present

## 2022-10-29 DIAGNOSIS — I5040 Unspecified combined systolic (congestive) and diastolic (congestive) heart failure: Secondary | ICD-10-CM | POA: Diagnosis not present

## 2022-10-29 DIAGNOSIS — E7800 Pure hypercholesterolemia, unspecified: Secondary | ICD-10-CM | POA: Diagnosis not present

## 2022-11-12 DIAGNOSIS — D485 Neoplasm of uncertain behavior of skin: Secondary | ICD-10-CM | POA: Diagnosis not present

## 2022-11-12 DIAGNOSIS — Z1283 Encounter for screening for malignant neoplasm of skin: Secondary | ICD-10-CM | POA: Diagnosis not present

## 2022-11-12 DIAGNOSIS — D0460 Carcinoma in situ of skin of unspecified upper limb, including shoulder: Secondary | ICD-10-CM | POA: Diagnosis not present

## 2022-11-12 DIAGNOSIS — L570 Actinic keratosis: Secondary | ICD-10-CM | POA: Diagnosis not present

## 2022-11-12 DIAGNOSIS — L568 Other specified acute skin changes due to ultraviolet radiation: Secondary | ICD-10-CM | POA: Diagnosis not present

## 2022-11-12 DIAGNOSIS — Z08 Encounter for follow-up examination after completed treatment for malignant neoplasm: Secondary | ICD-10-CM | POA: Diagnosis not present

## 2022-11-12 DIAGNOSIS — C44621 Squamous cell carcinoma of skin of unspecified upper limb, including shoulder: Secondary | ICD-10-CM | POA: Diagnosis not present

## 2022-11-12 DIAGNOSIS — L821 Other seborrheic keratosis: Secondary | ICD-10-CM | POA: Diagnosis not present

## 2022-11-14 DIAGNOSIS — M25531 Pain in right wrist: Secondary | ICD-10-CM | POA: Diagnosis not present

## 2022-11-14 DIAGNOSIS — M25532 Pain in left wrist: Secondary | ICD-10-CM | POA: Diagnosis not present

## 2022-11-14 DIAGNOSIS — G5601 Carpal tunnel syndrome, right upper limb: Secondary | ICD-10-CM | POA: Diagnosis not present

## 2022-11-14 DIAGNOSIS — M65839 Other synovitis and tenosynovitis, unspecified forearm: Secondary | ICD-10-CM | POA: Diagnosis not present

## 2022-11-20 ENCOUNTER — Encounter: Payer: Self-pay | Admitting: Physician Assistant

## 2022-11-20 ENCOUNTER — Ambulatory Visit: Payer: Medicare Other | Attending: Physician Assistant | Admitting: Physician Assistant

## 2022-11-20 VITALS — BP 106/60 | HR 70 | Ht 67.0 in | Wt 182.6 lb

## 2022-11-20 DIAGNOSIS — I5022 Chronic systolic (congestive) heart failure: Secondary | ICD-10-CM

## 2022-11-20 DIAGNOSIS — Z01810 Encounter for preprocedural cardiovascular examination: Secondary | ICD-10-CM | POA: Diagnosis not present

## 2022-11-20 DIAGNOSIS — I2510 Atherosclerotic heart disease of native coronary artery without angina pectoris: Secondary | ICD-10-CM | POA: Diagnosis not present

## 2022-11-20 DIAGNOSIS — E782 Mixed hyperlipidemia: Secondary | ICD-10-CM | POA: Diagnosis not present

## 2022-11-20 NOTE — Patient Instructions (Signed)
Medication Instructions:  Your physician recommends that you continue on your current medications as directed. Please refer to the Current Medication list given to you today.  *If you need a refill on your cardiac medications before your next appointment, please call your pharmacy*   Lab Work: None ordered  If you have labs (blood work) drawn today and your tests are completely normal, you will receive your results only by: [place] (if you have MyChart) OR A paper copy in the mail If you have any lab test that is abnormal or we need to change your treatment, we will call you to review the results.   Testing/Procedures: None ordered   Follow-Up: At [facility], you and your health needs are our priority.  As part of our continuing mission to provide you with exceptional heart care, we have created designated Provider Care Teams.  These Care Teams include your primary Cardiologist (physician) and Advanced Practice Providers (APPs -  Physician Assistants and Nurse Practitioners) who all work together to provide you with the care you need, when you need it.  We recommend signing up for the patient portal called "MyChart".  Sign up information is provided on this After Visit Summary.  MyChart is used to connect with patients for Virtual Visits (Telemedicine).  Patients are able to view lab/test results, encounter notes, upcoming appointments, etc.  Non-urgent messages can be sent to your provider as well.   To learn more about what you can do with MyChart, go to [email or web address].    Your next appointment:   1 year(s)  Provider:   Nelva Bush, MD    Other Instructions

## 2022-11-20 NOTE — Assessment & Plan Note (Signed)
Labs from [facility] reviewed.  LDL in February 2024 was optimal at 61.  He would like to reduce the dose of Lipitor.  We discussed decreasing this to 40 mg daily and rechecking labs in 3 to 4 months.  He prefers to hold off on this for now until he gets established in his new home.  Continue Lipitor 80 mg daily.

## 2022-11-20 NOTE — Assessment & Plan Note (Signed)
History of anteroseptal STEMI in December 2019 treated with a DES to the LAD.  Myoview in 2022 was low risk.  He is doing well without anginal symptoms.  Continue aspirin 81 mg, Lipitor 80 mg, Toprol-XL 12.5 mg.  He will be moving to [place] soon.  I have recommended that he follow-up with Dr. Saunders Revel in 1 year.  If he decides to stay in [place], he can see either me or Dr. Burt Knack here.

## 2022-11-20 NOTE — Assessment & Plan Note (Signed)
He does not have any unstable cardiac conditions.  He is able to achieve 4 METs or greater without difficulty.  Carpal tunnel surgery is low risk.  He can proceed at acceptable risk without further testing.  If aspirin needs to be held, this can be held for 7 days and then restarted postop when able.

## 2022-11-20 NOTE — Assessment & Plan Note (Signed)
Ischemic cardiomyopathy, EF 40-45 by echocardiogram in 2019.  NYHA II.  Volume status stable.  Blood pressure has limited titration of GDMT.  He is interested in reducing medications if at all possible.  Continue lisinopril 2.5 mg daily, Toprol XL 12.5 mg daily.  Labs from [facility] reviewed.  In February 2024, creatinine was normal at 1.0 and potassium was normal at 4.8.

## 2022-11-20 NOTE — Progress Notes (Signed)
Cardiology Office Note:    Date:  11/20/2022  ID:  Awilda Metro, DOB August 22, 1941, MRN [id number] [place] [facility] Cardiologist:  Sherren Mocha, MD Cardiology APP:  Liliane Shi, PA-C      Patient Profile:   Coronary artery disease S/p anteroseptal STEMI 08/22/2018 - tx with 3 x 18 mm DES to the LAD Myoview 6/22: EF 52, anterior apical MI, no ischemia; low risk Combined systolic and diastolic CHF Ischemic cardiomyopathy, EF 40-45 TTE 08/23/18: Mild LVH, EF 40-45, anteroseptal and apical akinesis, grade 1 diastolic dysfunction, mildly dilated RV  Hyperlipidemia BPH Nephrolithiasis      History of Present Illness:   James [name] is a 82 y.o. male returns for follow-up on CAD and CHF.  He was last seen in clinic by Ermalinda Barrios, PA-C 11/07/2021. He is here alone. He is somewhat discouraged from aging. A lot of his friends are dying. He has lot of joint pains. He needs bilateral carpal tunnel surgery. He is moving to [place] and would like to follow up there. He has not had chest pain, shortness of breath, syncope, edema. He walks on a regular basis.   Review of Systems  Musculoskeletal:  Positive for arthritis and joint pain.    See HPI    Studies Reviewed:    EKG:  NSR, HR 70, 1st degree AVB, PR 244 ms, no STTW changes, QTc 403 ms   Risk Assessment/Calculations:             Physical Exam:   VS:  BP 106/60   Pulse 70   Ht '5\' 7"'$  (1.702 m)   Wt 182 lb 9.6 oz (82.8 kg)   SpO2 94%   BMI 28.60 kg/m    Wt Readings from Last 3 Encounters:  11/20/22 182 lb 9.6 oz (82.8 kg)  11/07/21 176 lb (79.8 kg)  03/07/21 173 lb 9.6 oz (78.7 kg)    Constitutional:      Appearance: Healthy appearance. Not in distress.  Neck:     Vascular: No carotid bruit. JVD normal.  Pulmonary:     Breath sounds: Normal breath sounds. No wheezing. No rales.  Cardiovascular:     Normal rate. Regular rhythm. Normal S1. Normal S2.      Murmurs: There is no murmur.  Edema:     Peripheral edema absent.  Abdominal:     Palpations: Abdomen is soft.       ASSESSMENT AND PLAN:   CAD (coronary artery disease) History of anteroseptal STEMI in December 2019 treated with a DES to the LAD.  Myoview in 2022 was low risk.  He is doing well without anginal symptoms.  Continue aspirin 81 mg, Lipitor 80 mg, Toprol-XL 12.5 mg.  He will be moving to [place] soon.  I have recommended that he follow-up with Dr. Saunders Revel in 1 year.  If he decides to stay in [place], he can see either me or Dr. Burt Knack here.  Heart failure with mildly reduced ejection fraction (HFmrEF) (HCC) Ischemic cardiomyopathy, EF 40-45 by echocardiogram in 2019.  NYHA II.  Volume status stable.  Blood pressure has limited titration of GDMT.  He is interested in reducing medications if at all possible.  Continue lisinopril 2.5 mg daily, Toprol XL 12.5 mg daily.  Labs from [facility] reviewed.  In February 2024, creatinine was normal at 1.0 and potassium was normal at 4.8.  Hyperlipidemia Labs from [facility] reviewed.  LDL in February 2024 was optimal at 61.  He would like to  reduce the dose of Lipitor.  We discussed decreasing this to 40 mg daily and rechecking labs in 3 to 4 months.  He prefers to hold off on this for now until he gets established in his new home.  Continue Lipitor 80 mg daily.  Preoperative cardiovascular examination He does not have any unstable cardiac conditions.  He is able to achieve 4 METs or greater without difficulty.  Carpal tunnel surgery is low risk.  He can proceed at acceptable risk without further testing.  If aspirin needs to be held, this can be held for 7 days and then restarted postop when able.        Dispo:  Return in about 1 year (around 11/20/2023) for Routine Follow Up w/ Dr. Saunders Revel in [place] or Dr. Burt Knack in [place].  Signed, Richardson Dopp, PA-C

## 2022-11-26 DIAGNOSIS — G5602 Carpal tunnel syndrome, left upper limb: Secondary | ICD-10-CM | POA: Diagnosis not present

## 2022-12-10 DIAGNOSIS — M79642 Pain in left hand: Secondary | ICD-10-CM | POA: Diagnosis not present

## 2022-12-24 DIAGNOSIS — M79642 Pain in left hand: Secondary | ICD-10-CM | POA: Diagnosis not present

## 2022-12-31 DIAGNOSIS — C44622 Squamous cell carcinoma of skin of right upper limb, including shoulder: Secondary | ICD-10-CM | POA: Diagnosis not present

## 2023-01-01 DIAGNOSIS — K148 Other diseases of tongue: Secondary | ICD-10-CM | POA: Diagnosis not present

## 2023-01-01 DIAGNOSIS — R350 Frequency of micturition: Secondary | ICD-10-CM | POA: Diagnosis not present

## 2023-01-03 DIAGNOSIS — K148 Other diseases of tongue: Secondary | ICD-10-CM | POA: Diagnosis not present

## 2023-01-03 DIAGNOSIS — J358 Other chronic diseases of tonsils and adenoids: Secondary | ICD-10-CM | POA: Diagnosis not present

## 2023-01-08 DIAGNOSIS — K080 Exfoliation of teeth due to systemic causes: Secondary | ICD-10-CM | POA: Diagnosis not present

## 2023-01-14 DIAGNOSIS — C4490 Unspecified malignant neoplasm of skin, unspecified: Secondary | ICD-10-CM | POA: Diagnosis not present

## 2023-01-20 ENCOUNTER — Other Ambulatory Visit: Payer: Self-pay | Admitting: Cardiovascular Disease

## 2023-02-04 DIAGNOSIS — L820 Inflamed seborrheic keratosis: Secondary | ICD-10-CM | POA: Diagnosis not present

## 2023-02-04 DIAGNOSIS — C44629 Squamous cell carcinoma of skin of left upper limb, including shoulder: Secondary | ICD-10-CM | POA: Diagnosis not present

## 2023-02-04 DIAGNOSIS — C44622 Squamous cell carcinoma of skin of right upper limb, including shoulder: Secondary | ICD-10-CM | POA: Diagnosis not present

## 2023-02-04 DIAGNOSIS — D485 Neoplasm of uncertain behavior of skin: Secondary | ICD-10-CM | POA: Diagnosis not present

## 2023-02-04 DIAGNOSIS — D225 Melanocytic nevi of trunk: Secondary | ICD-10-CM | POA: Diagnosis not present

## 2023-02-04 DIAGNOSIS — L570 Actinic keratosis: Secondary | ICD-10-CM | POA: Diagnosis not present

## 2023-03-11 DIAGNOSIS — L905 Scar conditions and fibrosis of skin: Secondary | ICD-10-CM | POA: Diagnosis not present

## 2023-03-11 DIAGNOSIS — C44622 Squamous cell carcinoma of skin of right upper limb, including shoulder: Secondary | ICD-10-CM | POA: Diagnosis not present

## 2023-03-25 DIAGNOSIS — N200 Calculus of kidney: Secondary | ICD-10-CM | POA: Diagnosis not present

## 2023-03-26 DIAGNOSIS — C44629 Squamous cell carcinoma of skin of left upper limb, including shoulder: Secondary | ICD-10-CM | POA: Diagnosis not present

## 2023-03-27 DIAGNOSIS — R339 Retention of urine, unspecified: Secondary | ICD-10-CM | POA: Diagnosis not present

## 2023-03-27 DIAGNOSIS — R972 Elevated prostate specific antigen [PSA]: Secondary | ICD-10-CM | POA: Diagnosis not present

## 2023-03-27 DIAGNOSIS — N401 Enlarged prostate with lower urinary tract symptoms: Secondary | ICD-10-CM | POA: Diagnosis not present

## 2023-03-27 DIAGNOSIS — N312 Flaccid neuropathic bladder, not elsewhere classified: Secondary | ICD-10-CM | POA: Diagnosis not present

## 2023-04-09 ENCOUNTER — Telehealth: Payer: Self-pay | Admitting: *Deleted

## 2023-04-09 NOTE — Telephone Encounter (Signed)
I s/w the pt and he has been scheduled for tele pre op appt 04/22/23 @ 2:20. Med rec and consent are done.

## 2023-04-09 NOTE — Telephone Encounter (Signed)
I s/w the pt and he has been scheduled for tele pre op appt 04/22/23 @ 2:20. Med rec and consent are done.      Patient Consent for Virtual Visit        James [name] has provided verbal consent on 04/09/2023 for a virtual visit (video or telephone).   CONSENT FOR VIRTUAL VISIT FOR:  James [name]  By participating in this virtual visit I agree to the following:  I hereby voluntarily request, consent and authorize [facility] and its employed or contracted physicians, physician assistants, nurse practitioners or other licensed health care professionals (the Practitioner), to provide me with telemedicine health care services (the "Services") as deemed necessary by the treating Practitioner. I acknowledge and consent to receive the Services by the Practitioner via telemedicine. I understand that the telemedicine visit will involve communicating with the Practitioner through live audiovisual communication technology and the disclosure of certain medical information by electronic transmission. I acknowledge that I have been given the opportunity to request an in-person assessment or other available alternative prior to the telemedicine visit and am voluntarily participating in the telemedicine visit.  I understand that I have the right to withhold or withdraw my consent to the use of telemedicine in the course of my care at any time, without affecting my right to future care or treatment, and that the Practitioner or I may terminate the telemedicine visit at any time. I understand that I have the right to inspect all information obtained and/or recorded in the course of the telemedicine visit and may receive copies of available information for a reasonable fee.  I understand that some of the potential risks of receiving the Services via telemedicine include:  Delay or interruption in medical evaluation due to technological equipment failure or disruption; Information transmitted may not be  sufficient (e.g. poor resolution of images) to allow for appropriate medical decision making by the Practitioner; and/or  In rare instances, security protocols could fail, causing a breach of personal health information.  Furthermore, I acknowledge that it is my responsibility to provide information about my medical history, conditions and care that is complete and accurate to the best of my ability. I acknowledge that Practitioner's advice, recommendations, and/or decision may be based on factors not within their control, such as incomplete or inaccurate data provided by me or distortions of diagnostic images or specimens that may result from electronic transmissions. I understand that the practice of medicine is not an exact science and that Practitioner makes no warranties or guarantees regarding treatment outcomes. I acknowledge that a copy of this consent can be made available to me via my patient portal [facility] MyChart), or I can request a printed copy by calling the office of [facility].    I understand that my insurance will be billed for this visit.   I have read or had this consent read to me. I understand the contents of this consent, which adequately explains the benefits and risks of the Services being provided via telemedicine.  I have been provided ample opportunity to ask questions regarding this consent and the Services and have had my questions answered to my satisfaction. I give my informed consent for the services to be provided through the use of telemedicine in my medical care

## 2023-04-09 NOTE — Telephone Encounter (Signed)
   Name: James [name]  DOB: 02/17/1941  MRN: [id number]  Primary Cardiologist: Tonny Bollman, MD   Preoperative team, please contact this patient and set up a phone call appointment for further preoperative risk assessment. Please obtain consent and complete medication review. Thank you for your help.  I confirm that guidance regarding antiplatelet and oral anticoagulation therapy has been completed and, if necessary, noted below.  Per protocol patient should continue aspirin through perioperative period.   Napoleon Form, Leodis Rains, NP 04/09/2023, 10:57 AM [facility]

## 2023-04-09 NOTE — Telephone Encounter (Signed)
   Pre-operative Risk Assessment    Patient Name: James [name]  DOB: 1941-07-23 MRN: [id number]      Request for Surgical Clearance    Procedure:   RIGHT CARPAL TUNNEL RELEASE   Date of Surgery:  Clearance 04/25/23                                 Surgeon:  DR. Dominica Severin Surgeon's Group or Practice Name:  [facility] Phone number:  [phone] Fax number:  [phone] ATTN: Rosalva Ferron   Type of Clearance Requested:   - Medical ; ASA    Type of Anesthesia:  Not Indicated   Additional requests/questions:    [name]   04/09/2023, 10:36 AM

## 2023-04-18 DIAGNOSIS — R339 Retention of urine, unspecified: Secondary | ICD-10-CM | POA: Diagnosis not present

## 2023-04-22 ENCOUNTER — Ambulatory Visit: Payer: Medicare Other | Attending: Interventional Cardiology

## 2023-04-22 DIAGNOSIS — Z01810 Encounter for preprocedural cardiovascular examination: Secondary | ICD-10-CM

## 2023-04-22 NOTE — Progress Notes (Signed)
Virtual Visit via Telephone Note   Because of James [name]'s co-morbid illnesses, he is at least at moderate risk for complications without adequate follow up.  This format is felt to be most appropriate for this patient at this time.  The patient did not have access to video technology/had technical difficulties with video requiring transitioning to audio format only (telephone).  All issues noted in this document were discussed and addressed.  No physical exam could be performed with this format.  Please refer to the patient's chart for his consent to telehealth for [facility].  Evaluation Performed:  Preoperative cardiovascular risk assessment _____________   Date:  04/22/2023   Patient ID:  James [name], DOB 1940/12/28, MRN [id number] Patient Location:  Home Provider location:   Office  Primary Care Provider:  Clovis Riley, L.August Saucer, MD Primary Cardiologist:  Tonny Bollman, MD  Chief Complaint / Patient Profile   82 y.o. y/o male with a h/o CAD status post DES to LAD in 2019 with Myoview in 2020 (see no ischemia low risk), combined systolic and diastolic CHF, hyperlipidemia, BPH, nephrolithiasis who is pending right carpal tunnel release and presents today for telephonic preoperative cardiovascular risk assessment.  History of Present Illness    James [name] is a 82 y.o. male who presents via audio/video conferencing for a telehealth visit today.  Pt was last seen in cardiology clinic on 11/20/22 by Tereso Newcomer, PA-C.  At that time James [name] was doing well .  The patient is now pending procedure as outlined above. Since his last visit, he has had no issues with his heart.  He walks around 3000 steps a day.  Does not think he could do 2 consecutive blocks.  However, does meet criteria for minimal METS.  He likes doing the cooking in the household.  He stopped his aspirin on Saturday.  Okay to hold 5 to 7 days with remote history of stenting back in 2019.  He did the  same thing when he had the left side worked on.  Past Medical History    Past Medical History:  Diagnosis Date   Basal cell carcinoma 02/04/2019   left ear rim tx with bx curet and cautery   BPH (benign prostatic hypertrophy)    CAD (coronary artery disease), native coronary artery    a. Cath 08/22/18 for acute anteroseptal MI s/p DES to mLAD   Chronic combined systolic and diastolic heart failure (HCC) 09/04/2018   Ischemic CM // Ant-sept MI 12/19 tx with DES to pLAD   Hyperlipidemia    Kidney stone    Prostatitis    SCC (squamous cell carcinoma) 07/09/2007   right medial lower leg scc/ka tx cx3 71fu   SCC (squamous cell carcinoma) 04/19/2008   right outer lower leg ka tx cx3 67fu   SCC (squamous cell carcinoma) 03/22/2009   right forearm scc/ka tx cx3 9fu   SCC (squamous cell carcinoma) 05/16/2009   right abdomen well diff scca tx exc   SCC (squamous cell carcinoma) 06/07/2009   left postauricular scca/ka cx3 43fu   SCC (squamous cell carcinoma) 04/30/2011   right upper arm scc well diff tx with bx   SCC (squamous cell carcinoma) 01/08/2012   left inner calf scc/ka tx with bx   SCC (squamous cell carcinoma) 04/29/2012   right hand scc/ka tx with bx   SCC (squamous cell carcinoma) 04/29/2012   right post scalp scc mod diff adenoid cx3 9fu   SCC (squamous cell  carcinoma) 06/08/2008   left forearm scc in situ tx with bx   SCC (squamous cell carcinoma) 03/09/2013   left lower calf scc/ka tx with bx   SCC (squamous cell carcinoma) 05/27/2013   left outer thigh diff scc tx with bx   SCC (squamous cell carcinoma) 06/29/2014   left outer knee scc tx with bx   SCC (squamous cell carcinoma) 06/29/2014   right hand scc tx with bx   SCC (squamous cell carcinoma) 09/28/2014   mid chest well diff scc tx with bx   SCC (squamous cell carcinoma) 09/28/2014   left shin scc/ka tx with bx   SCC (squamous cell carcinoma) 05/30/2015   left abdomen scc well diff tx cx3 23fu   SCC (squamous  cell carcinoma) 07/25/2015   left lower leg scc/ka tx with bx   SCC (squamous cell carcinoma) 07/25/2015   right lower leg scc/ka tx with bx   SCC (squamous cell carcinoma) 08/29/2015   right upper arm scc/ka tx with bx   SCC (squamous cell carcinoma) 02/06/2016   left preauricular scc in situ tx with bx   SCC (squamous cell carcinoma) 05/02/2016   right anterior shin scc/ka tx with bx   SCC (squamous cell carcinoma) 06/12/2016   right anterior shin scc/ka tx with bx   SCC (squamous cell carcinoma) 06/12/2016   left outer calf atypical scc tx with bx   SCC (squamous cell carcinoma) 06/12/2016   right upper calf atypical scc tx with bx   SCC (squamous cell carcinoma) 05/22/2017   left calf superior scc/ka tx with bx   SCC (squamous cell carcinoma) 05/22/2017   left calf inferior atypial squamous tx with bx   SCC (squamous cell carcinoma) 02/26/2018   right upper outer shin well diff scc tx with bx mohs   SCC (squamous cell carcinoma) 02/26/2018   left lower inner shin well diff scc tx with bx mohs   SCC (squamous cell carcinoma) 04/01/2018   left outer lower calf well diff scc tx with bx   SCC (squamous cell carcinoma) 07/14/2018   left outer post calf scc well diff tx with bx   SCC (squamous cell carcinoma) 09/24/2018   left calf lateral scc/ka tx with bx   SCC (squamous cell carcinoma) 09/24/2018   left calf medial atypical squamous tx with bx   SCC (squamous cell carcinoma) 11/18/2018   left lower post leg tx mohs   SCC (squamous cell carcinoma) 02/04/2019   left scalp scc/ka tx with bx   SCC (squamous cell carcinoma) 02/04/2019   right thigh scc in situ tx with bx   SCC (squamous cell carcinoma) 05/27/2019   right post shoulder well diff scc tx with bx   SCC (squamous cell carcinoma) 05/27/2019   left calf scc in situ 8mm punch margin free    SCC (squamous cell carcinoma) 07/13/2019   left inner elbow well diff scc tx with bx   SCC (squamous cell carcinoma) 09/16/2019    left hand dorsum well diff scc tx with bx   SCC (squamous cell carcinoma) 09/16/2019   left upper arm well diff scc tx with bx   SCC (squamous cell carcinoma) 09/16/2019   right outer hand atypical squamous  tx with bx    SCCA (squamous cell carcinoma) of skin 02/01/2021   Right Lower Leg - anterior (well diff) (tx p bx)   SCCA (squamous cell carcinoma) of skin 02/01/2021   Right Thigh - anterior (atypical proliferation) (tx p bx)  SCCA (squamous cell carcinoma) of skin 02/01/2021   Left Lower Leg - anterior (well diff) (tx p bx)   SCCA (squamous cell carcinoma) of skin 07/19/2021   Left Lower Leg-anterior (well diff) (tx p bx)   SCCA (squamous cell carcinoma) of skin 07/19/2021   Right Lower Leg-anterior (well diff) (tx p bx)   Skin cancer    Squamous cell carcinoma of skin 07/09/2007   right pretibial  scc/ka tx cx3 57fu   Squamous cell carcinoma of skin 09/20/2020   in situ- right posterior dorsum of hand (bx done via [place])   Trigger finger    Past Surgical History:  Procedure Laterality Date   CARDIAC CATHETERIZATION     CORONARY/GRAFT ACUTE MI REVASCULARIZATION N/A 08/22/2018   Procedure: Coronary/Graft Acute MI Revascularization;  Surgeon: Tonny Bollman, MD;  Location[facility] INVASIVE CV LAB;  Service: Cardiovascular;  Laterality: N/A;   LEFT HEART CATH AND CORONARY ANGIOGRAPHY N/A 08/22/2018   Procedure: LEFT HEART CATH AND CORONARY ANGIOGRAPHY;  Surgeon: Tonny Bollman, MD;  Location: [facility] INVASIVE CV LAB;  Service: Cardiovascular;  Laterality: N/A;    Allergies  Allergies  Allergen Reactions   Dicyclomine Hcl Other (See Comments)    Severe stomach pain    Sulfa Antibiotics Rash    Home Medications    Prior to Admission medications   Medication Sig Start Date End Date Taking? Authorizing Provider  ALPRAZolam (XANAX) 0.25 MG tablet Take 0.25 mg by mouth 3 (three) times daily as needed for anxiety. 10/15/21   [provider]  aspirin EC 81 MG tablet Take  81 mg by mouth daily. Swallow whole.    [provider]  atorvastatin (LIPITOR) 80 MG tablet TAKE 1 TABLET(80 MG) BY MOUTH DAILY AT 6 PM 10/23/22   Dyann Kief, PA-C  escitalopram (LEXAPRO) 10 MG tablet Take 10 mg by mouth daily. 01/28/22   [provider]  levothyroxine (SYNTHROID) 50 MCG tablet Take 50 mcg by mouth every morning. 12/03/20   [provider]  lisinopril (ZESTRIL) 2.5 MG tablet TAKE 1 TABLET(2.5 MG) BY MOUTH DAILY 01/20/23   Tonny Bollman, MD  metoprolol succinate (TOPROL-XL) 25 MG 24 hr tablet TAKE 1/2 TABLET(12.5 MG) BY MOUTH DAILY 01/20/23   Tonny Bollman, MD  Multiple Vitamin (MULTIVITAMIN WITH MINERALS) TABS tablet Take 1 tablet by mouth daily.    [provider]  Omega-3 Fatty Acids (FISH OIL) 1000 MG CAPS Take 1,000 mg by mouth at bedtime.     [provider]  potassium citrate (UROCIT-K) 10 MEQ (1080 MG) SR tablet Take 10 mEq by mouth 2 (two) times daily. 01/08/21   [provider]  tamsulosin (FLOMAX) 0.4 MG CAPS capsule Take 0.4 mg by mouth 2 (two) times daily.    [provider]    Physical Exam    Vital Signs:  James [name] does not have vital signs available for review today.  Given telephonic nature of communication, physical exam is limited. AAOx3. NAD. Normal affect.  Speech and respirations are unlabored.  Accessory Clinical Findings    None  Assessment & Plan    1.  Preoperative Cardiovascular Risk Assessment:  Mr. Prabhakar's perioperative risk of a major cardiac event is 6.6% according to the Revised Cardiac Risk Index (RCRI).  Therefore, he is at high risk for perioperative complications.   His functional capacity is good at 5.29 METs according to the [facility] Activity Status Index (DASI). Recommendations: According to ACC/AHA guidelines, no further cardiovascular testing needed.  The  patient may proceed to surgery at acceptable risk.   Antiplatelet and/or Anticoagulation  Recommendations: Aspirin can be held for 5-7 days prior to his surgery.  Please resume Aspirin post operatively when it is felt to be safe from a bleeding standpoint.   The patient was advised that if he develops new symptoms prior to surgery to contact our office to arrange for a follow-up visit, and he verbalized understanding.   A copy of this note will be routed to requesting surgeon.  Time:   Today, I have spent 5 minutes with the patient with telehealth technology discussing medical history, symptoms, and management plan.     Sharlene Dory, PA-C  04/22/2023, 2:08 PM

## 2023-04-25 DIAGNOSIS — G5601 Carpal tunnel syndrome, right upper limb: Secondary | ICD-10-CM | POA: Diagnosis not present

## 2023-05-09 DIAGNOSIS — M25641 Stiffness of right hand, not elsewhere classified: Secondary | ICD-10-CM | POA: Diagnosis not present

## 2023-05-14 ENCOUNTER — Ambulatory Visit
Admission: RE | Admit: 2023-05-14 | Discharge: 2023-05-14 | Disposition: A | Discharge status: Home / Self Care | Payer: Medicare Other | Source: Ambulatory Visit | Attending: Family Medicine | Admitting: Family Medicine

## 2023-05-14 ENCOUNTER — Other Ambulatory Visit: Payer: Self-pay | Admitting: Family Medicine

## 2023-05-14 DIAGNOSIS — N400 Enlarged prostate without lower urinary tract symptoms: Secondary | ICD-10-CM | POA: Diagnosis not present

## 2023-05-14 DIAGNOSIS — F411 Generalized anxiety disorder: Secondary | ICD-10-CM | POA: Diagnosis not present

## 2023-05-14 DIAGNOSIS — I5040 Unspecified combined systolic (congestive) and diastolic (congestive) heart failure: Secondary | ICD-10-CM | POA: Diagnosis not present

## 2023-05-14 DIAGNOSIS — M25552 Pain in left hip: Secondary | ICD-10-CM | POA: Diagnosis not present

## 2023-05-14 DIAGNOSIS — I25119 Atherosclerotic heart disease of native coronary artery with unspecified angina pectoris: Secondary | ICD-10-CM | POA: Diagnosis not present

## 2023-05-14 DIAGNOSIS — E7800 Pure hypercholesterolemia, unspecified: Secondary | ICD-10-CM | POA: Diagnosis not present

## 2023-05-14 DIAGNOSIS — F3342 Major depressive disorder, recurrent, in full remission: Secondary | ICD-10-CM | POA: Diagnosis not present

## 2023-05-14 DIAGNOSIS — E039 Hypothyroidism, unspecified: Secondary | ICD-10-CM | POA: Diagnosis not present

## 2023-05-14 DIAGNOSIS — M1612 Unilateral primary osteoarthritis, left hip: Secondary | ICD-10-CM | POA: Diagnosis not present

## 2023-05-19 DIAGNOSIS — R339 Retention of urine, unspecified: Secondary | ICD-10-CM | POA: Diagnosis not present

## 2023-05-20 DIAGNOSIS — H5203 Hypermetropia, bilateral: Secondary | ICD-10-CM | POA: Diagnosis not present

## 2023-05-20 DIAGNOSIS — H35373 Puckering of macula, bilateral: Secondary | ICD-10-CM | POA: Diagnosis not present

## 2023-05-28 DIAGNOSIS — Z4789 Encounter for other orthopedic aftercare: Secondary | ICD-10-CM | POA: Diagnosis not present

## 2023-05-28 DIAGNOSIS — M545 Low back pain, unspecified: Secondary | ICD-10-CM | POA: Diagnosis not present

## 2023-06-09 DIAGNOSIS — L820 Inflamed seborrheic keratosis: Secondary | ICD-10-CM | POA: Diagnosis not present

## 2023-06-09 DIAGNOSIS — D485 Neoplasm of uncertain behavior of skin: Secondary | ICD-10-CM | POA: Diagnosis not present

## 2023-06-09 DIAGNOSIS — C44729 Squamous cell carcinoma of skin of left lower limb, including hip: Secondary | ICD-10-CM | POA: Diagnosis not present

## 2023-06-09 DIAGNOSIS — C44629 Squamous cell carcinoma of skin of left upper limb, including shoulder: Secondary | ICD-10-CM | POA: Diagnosis not present

## 2023-06-09 DIAGNOSIS — L570 Actinic keratosis: Secondary | ICD-10-CM | POA: Diagnosis not present

## 2023-06-11 DIAGNOSIS — M79672 Pain in left foot: Secondary | ICD-10-CM | POA: Diagnosis not present

## 2023-06-11 DIAGNOSIS — L84 Corns and callosities: Secondary | ICD-10-CM | POA: Diagnosis not present

## 2023-06-21 DIAGNOSIS — Z23 Encounter for immunization: Secondary | ICD-10-CM | POA: Diagnosis not present

## 2023-07-16 DIAGNOSIS — K080 Exfoliation of teeth due to systemic causes: Secondary | ICD-10-CM | POA: Diagnosis not present

## 2023-07-21 DIAGNOSIS — D485 Neoplasm of uncertain behavior of skin: Secondary | ICD-10-CM | POA: Diagnosis not present

## 2023-07-21 DIAGNOSIS — L570 Actinic keratosis: Secondary | ICD-10-CM | POA: Diagnosis not present

## 2023-07-21 DIAGNOSIS — C44722 Squamous cell carcinoma of skin of right lower limb, including hip: Secondary | ICD-10-CM | POA: Diagnosis not present

## 2023-08-12 DIAGNOSIS — C44722 Squamous cell carcinoma of skin of right lower limb, including hip: Secondary | ICD-10-CM | POA: Diagnosis not present

## 2023-08-18 DIAGNOSIS — R339 Retention of urine, unspecified: Secondary | ICD-10-CM | POA: Diagnosis not present

## 2023-08-25 DIAGNOSIS — H6123 Impacted cerumen, bilateral: Secondary | ICD-10-CM | POA: Diagnosis not present

## 2023-08-25 DIAGNOSIS — F411 Generalized anxiety disorder: Secondary | ICD-10-CM | POA: Diagnosis not present

## 2023-08-25 DIAGNOSIS — M791 Myalgia, unspecified site: Secondary | ICD-10-CM | POA: Diagnosis not present

## 2023-08-25 DIAGNOSIS — E7800 Pure hypercholesterolemia, unspecified: Secondary | ICD-10-CM | POA: Diagnosis not present

## 2023-08-25 DIAGNOSIS — I2510 Atherosclerotic heart disease of native coronary artery without angina pectoris: Secondary | ICD-10-CM | POA: Diagnosis not present

## 2023-08-31 DIAGNOSIS — N3001 Acute cystitis with hematuria: Secondary | ICD-10-CM | POA: Diagnosis not present

## 2023-08-31 DIAGNOSIS — J0110 Acute frontal sinusitis, unspecified: Secondary | ICD-10-CM | POA: Diagnosis not present

## 2023-08-31 DIAGNOSIS — R051 Acute cough: Secondary | ICD-10-CM | POA: Diagnosis not present

## 2023-08-31 DIAGNOSIS — R300 Dysuria: Secondary | ICD-10-CM | POA: Diagnosis not present

## 2023-09-09 DIAGNOSIS — C44729 Squamous cell carcinoma of skin of left lower limb, including hip: Secondary | ICD-10-CM | POA: Diagnosis not present

## 2023-09-09 DIAGNOSIS — L821 Other seborrheic keratosis: Secondary | ICD-10-CM | POA: Diagnosis not present

## 2023-09-09 DIAGNOSIS — B078 Other viral warts: Secondary | ICD-10-CM | POA: Diagnosis not present

## 2023-09-09 DIAGNOSIS — D485 Neoplasm of uncertain behavior of skin: Secondary | ICD-10-CM | POA: Diagnosis not present

## 2023-09-09 DIAGNOSIS — L570 Actinic keratosis: Secondary | ICD-10-CM | POA: Diagnosis not present

## 2023-09-25 DIAGNOSIS — E7800 Pure hypercholesterolemia, unspecified: Secondary | ICD-10-CM | POA: Diagnosis not present

## 2023-10-07 DIAGNOSIS — L905 Scar conditions and fibrosis of skin: Secondary | ICD-10-CM | POA: Diagnosis not present

## 2023-10-07 DIAGNOSIS — C44729 Squamous cell carcinoma of skin of left lower limb, including hip: Secondary | ICD-10-CM | POA: Diagnosis not present

## 2023-10-23 DIAGNOSIS — N401 Enlarged prostate with lower urinary tract symptoms: Secondary | ICD-10-CM | POA: Diagnosis not present

## 2023-10-23 DIAGNOSIS — N281 Cyst of kidney, acquired: Secondary | ICD-10-CM | POA: Diagnosis not present

## 2023-10-23 DIAGNOSIS — N200 Calculus of kidney: Secondary | ICD-10-CM | POA: Diagnosis not present

## 2023-10-23 DIAGNOSIS — N312 Flaccid neuropathic bladder, not elsewhere classified: Secondary | ICD-10-CM | POA: Diagnosis not present

## 2023-11-04 DIAGNOSIS — E7800 Pure hypercholesterolemia, unspecified: Secondary | ICD-10-CM | POA: Diagnosis not present

## 2023-11-04 DIAGNOSIS — I2510 Atherosclerotic heart disease of native coronary artery without angina pectoris: Secondary | ICD-10-CM | POA: Diagnosis not present

## 2023-11-04 DIAGNOSIS — E039 Hypothyroidism, unspecified: Secondary | ICD-10-CM | POA: Diagnosis not present

## 2023-11-05 DIAGNOSIS — E7800 Pure hypercholesterolemia, unspecified: Secondary | ICD-10-CM | POA: Diagnosis not present

## 2023-11-05 DIAGNOSIS — F411 Generalized anxiety disorder: Secondary | ICD-10-CM | POA: Diagnosis not present

## 2023-11-05 DIAGNOSIS — Z0000 Encounter for general adult medical examination without abnormal findings: Secondary | ICD-10-CM | POA: Diagnosis not present

## 2023-11-05 DIAGNOSIS — N400 Enlarged prostate without lower urinary tract symptoms: Secondary | ICD-10-CM | POA: Diagnosis not present

## 2023-11-05 DIAGNOSIS — I2510 Atherosclerotic heart disease of native coronary artery without angina pectoris: Secondary | ICD-10-CM | POA: Diagnosis not present

## 2023-11-17 DIAGNOSIS — R339 Retention of urine, unspecified: Secondary | ICD-10-CM | POA: Diagnosis not present

## 2023-11-24 DIAGNOSIS — D485 Neoplasm of uncertain behavior of skin: Secondary | ICD-10-CM | POA: Diagnosis not present

## 2023-11-24 DIAGNOSIS — C44729 Squamous cell carcinoma of skin of left lower limb, including hip: Secondary | ICD-10-CM | POA: Diagnosis not present

## 2023-12-08 ENCOUNTER — Other Ambulatory Visit: Payer: Self-pay | Admitting: Cardiovascular Disease

## 2023-12-22 DIAGNOSIS — Z85828 Personal history of other malignant neoplasm of skin: Secondary | ICD-10-CM | POA: Diagnosis not present

## 2023-12-22 DIAGNOSIS — C44729 Squamous cell carcinoma of skin of left lower limb, including hip: Secondary | ICD-10-CM | POA: Diagnosis not present

## 2023-12-24 DIAGNOSIS — E7800 Pure hypercholesterolemia, unspecified: Secondary | ICD-10-CM | POA: Diagnosis not present

## 2023-12-24 DIAGNOSIS — E039 Hypothyroidism, unspecified: Secondary | ICD-10-CM | POA: Diagnosis not present

## 2024-01-07 ENCOUNTER — Other Ambulatory Visit: Payer: Self-pay | Admitting: Cardiovascular Disease

## 2024-01-21 DIAGNOSIS — K080 Exfoliation of teeth due to systemic causes: Secondary | ICD-10-CM | POA: Diagnosis not present

## 2024-02-16 DIAGNOSIS — R339 Retention of urine, unspecified: Secondary | ICD-10-CM | POA: Diagnosis not present

## 2024-03-25 DIAGNOSIS — N400 Enlarged prostate without lower urinary tract symptoms: Secondary | ICD-10-CM | POA: Diagnosis not present

## 2024-03-25 DIAGNOSIS — I5040 Unspecified combined systolic (congestive) and diastolic (congestive) heart failure: Secondary | ICD-10-CM | POA: Diagnosis not present

## 2024-03-25 DIAGNOSIS — R2689 Other abnormalities of gait and mobility: Secondary | ICD-10-CM | POA: Diagnosis not present

## 2024-03-25 DIAGNOSIS — E7800 Pure hypercholesterolemia, unspecified: Secondary | ICD-10-CM | POA: Diagnosis not present

## 2024-03-25 DIAGNOSIS — R110 Nausea: Secondary | ICD-10-CM | POA: Diagnosis not present

## 2024-03-25 DIAGNOSIS — E039 Hypothyroidism, unspecified: Secondary | ICD-10-CM | POA: Diagnosis not present

## 2024-03-27 ENCOUNTER — Other Ambulatory Visit: Payer: Self-pay | Admitting: Cardiovascular Disease

## 2024-04-01 ENCOUNTER — Encounter: Payer: Self-pay | Admitting: Cardiovascular Disease

## 2024-04-01 ENCOUNTER — Ambulatory Visit: Attending: Cardiovascular Disease | Admitting: Cardiovascular Disease

## 2024-04-01 VITALS — BP 117/70 | HR 60 | Ht 66.0 in | Wt 184.0 lb

## 2024-04-01 DIAGNOSIS — I5022 Chronic systolic (congestive) heart failure: Secondary | ICD-10-CM | POA: Diagnosis not present

## 2024-04-01 DIAGNOSIS — I2510 Atherosclerotic heart disease of native coronary artery without angina pectoris: Secondary | ICD-10-CM | POA: Diagnosis not present

## 2024-04-01 DIAGNOSIS — E782 Mixed hyperlipidemia: Secondary | ICD-10-CM | POA: Diagnosis not present

## 2024-04-01 NOTE — Progress Notes (Signed)
 Cardiology Office Note:    Date:  04/01/2024   ID:  James [name], DOB [birth year], MRN [id number]  PCP:  Leonel Cole, MD   [facility] Providers Cardiologist:  Ozell Fell, MD Cardiology APP:  Lelon Glendia ONEIDA DEVONNA     Referring MD: No ref. provider found   Chief Complaint  Patient presents with   Coronary Artery Disease    History of Present Illness:    James [name] is a 83 y.o. 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  He is doing okay from a cardiac perspective.  He denies chest pain, chest pressure, or shortness of breath.  He has no heart palpitations, orthopnea, PND, or leg swelling.  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  He has no other specific complaints today.   Current Medications: Current Meds  Medication Sig   ALPRAZolam (XANAX) 0.25 MG tablet Take 0.25 mg by mouth 3 (three) times daily as needed for anxiety.   ascorbic acid (VITAMIN C) 500 MG tablet Take 500 mg by mouth daily.   aspirin  EC 81 MG tablet Take 81 mg by mouth daily. Swallow whole.   citalopram (CELEXA) 10 MG tablet Take 10 mg by mouth daily.   levothyroxine (SYNTHROID) 50 MCG tablet Take 50 mcg by mouth every morning.   lisinopril  (ZESTRIL ) 2.5 MG tablet Take 1 tablet (2.5 mg total) by mouth daily. Please call office to schedule an appt for further refills. Thank you   metoprolol  succinate (TOPROL -XL) 25 MG 24 hr tablet TAKE 1/2 TABLET(12.5 MG) BY MOUTH DAILY   Multiple Vitamin (MULTIVITAMIN WITH MINERALS) TABS tablet Take 1 tablet by mouth daily.    Omega-3 Fatty Acids (FISH OIL) 1000 MG CAPS Take 1,000 mg by mouth at bedtime.    potassium citrate (UROCIT-K) 10 MEQ (1080 MG) SR tablet Take 10 mEq by mouth 2 (two) times daily.   pyridOXINE (B-6) 50 MG tablet Take 100 mg by mouth daily.   REPATHA SURECLICK 140 MG/ML SOAJ Inject into the skin every 14 (fourteen) days.   tamsulosin  (FLOMAX ) 0.4 MG CAPS capsule Take 0.4 mg by mouth 2 (two) times daily.   [DISCONTINUED] atorvastatin  (LIPITOR ) 80 MG tablet TAKE 1 TABLET(80 MG) BY MOUTH DAILY AT 6 PM   [DISCONTINUED] escitalopram (LEXAPRO) 10 MG tablet Take 10 mg by mouth daily.     Allergies:   Dicyclomine hcl and Sulfa antibiotics   ROS:   Please see the history of present illness.    All other systems reviewed and are negative.  EKGs/Labs/Other Studies Reviewed:    The following studies were reviewed today: Cardiac Studies & Procedures   ______________________________________________________________________________________________ CARDIAC CATHETERIZATION  CARDIAC CATHETERIZATION 08/22/2018  Conclusion  Ost Cx to Prox Cx lesion is 25% stenosed.  Prox LAD lesion is 100% stenosed.  A drug-eluting stent was successfully placed using a STENT RESOLUTE ONYX 3.0X18.  Post intervention, there is a 0% residual stenosis.  The left ventricular systolic function is normal.  LV end diastolic pressure is mildly elevated.  The left ventricular ejection fraction is 45-50% by  visual estimate.  1.  Acute anteroseptal MI secondary to total occlusion of the mid LAD just after the origin of the first perforator and first diagonal branches 2.  Patent RCA and left circumflex with minimal nonobstructive disease 3.  Mild LV segmental contraction abnormality with periapical hypokinesis and an LVEF estimated at 45%  Recommendations: Continue Aggrastat  x6 hours, reload with ticagrelor  180 mg (patient vomited 10 minutes after initial loading dose), post MI medical therapy.  If no complications  arise patient should be eligible for discharge in 48 hours  Findings Coronary Findings Diagnostic  Dominance: Right  Left Main Vessel is large. Vessel is angiographically normal.  Left Anterior Descending Prox LAD lesion is 100% stenosed. The lesion is heavily thrombotic.  Left Circumflex Ost Cx to Prox Cx lesion is 25% stenosed.  Right Coronary Artery Vessel is angiographically normal.  Intervention  Prox LAD lesion Stent CATHETER LAUNCHER 6FREBU 3.5 guide catheter was inserted. Lesion crossed with guidewire using a WIRE COUGAR XT STRL 190CM. Pre-stent angioplasty was performed using a BALLOON SAPPHIRE 2.5X12. A drug-eluting stent was successfully placed using a STENT RESOLUTE ONYX 3.0X18. Post-stent angioplasty was performed using a BALLOON [place] EMERGE MR 3.25X15. Weight-based heparin  and Aggrastat  are used for anticoagulation/antiplatelet therapy.  The patient tolerates PCI well without immediate complication.  After stenting, there is slightly sluggish flow in the apical LAD and the patient is treated with 400 mcg of intracoronary verapamil .  Final angiography demonstrates TIMI-3 flow in all vessels. Post-Intervention Lesion Assessment The intervention was successful. Pre-interventional TIMI flow is 0. Post-intervention TIMI flow is 3. No complications occurred at this lesion. There is a 0% residual stenosis post intervention.   STRESS TESTS  MYOCARDIAL PERFUSION IMAGING 02/20/2021  Interpretation Summary  The left ventricular ejection fraction is mildly decreased (45-54%).  Nuclear stress EF: 52%.  Defect 1: There is a medium defect of moderate severity present in the apical anterior, apical septal, apical inferior and apex location. He has had a previous anterior wall infarction involving a wrap around LAD  Findings consistent with prior anterior apical myocardial infarction.  This is a low risk study. There is no evidence of ischemia .    ECHOCARDIOGRAM  ECHOCARDIOGRAM COMPLETE 08/23/2018  Narrative *[place]* *[facility]* [address] [place], [place] [address] [phone]  ------------------------------------------------------------------- Transthoracic Echocardiography  Patient:    Giann, Obara MR #:       [id number] Study Date: 08/23/2018 Gender:     M Age:        26 Height:     175.3 cm Weight:     84.4 kg BSA:        2.04 m^2 Pt. Status: Room:       [facility]  ADMITTING    Ozell Fell, MD ORDERING     Ozell Fell, MD REFERRING    Ozell Fell, MD ATTENDING    Cleotilde Rogue [id number] SONOGRAPHER  Jimmy Reel, RDCS PERFORMING   [facility], Inpatient  cc:  ------------------------------------------------------------------- LV EF: 40% -   45%  ------------------------------------------------------------------- Indications:      MI - acute 410.91.  ------------------------------------------------------------------- History:   PMH:   Chest pain.  ------------------------------------------------------------------- Study Conclusions  - Left ventricle: The cavity size was normal. Wall thickness was increased in a pattern of mild LVH. Systolic function was mildly to moderately reduced. The estimated ejection fraction was in the range of 40% to 45%. There is akinesis of the mid-apicalanteroseptal and apical myocardium. Doppler parameters are consistent with abnormal left ventricular  relaxation (grade 1 diastolic dysfunction). - Right ventricle: The cavity size was mildly dilated. Wall thickness was normal.  ------------------------------------------------------------------- Study data:  No prior study was available for comparison.  Study status:  Routine.  Procedure:  The patient reported no pain pre or post test. Transthoracic echocardiography. Image quality was adequate.          Transthoracic echocardiography.  M-mode, complete 2D, spectral Doppler, and color Doppler.   Birthdate: Patient birthdate: Jan 07, 1941.  Age:  Patient is 83 yr old.  Sex: Gender: male.    BMI: 27.5 kg/m^2.  Blood pressure:     91/64 Patient status:  Outpatient.  Study date:  Study date: 08/23/2018. Study time: 11:29 AM.  Location:  ICU/CCU  -------------------------------------------------------------------  ------------------------------------------------------------------- Left ventricle:  The cavity size was normal. Wall thickness was increased in a pattern of mild LVH. Systolic function was mildly to moderately reduced. The estimated ejection fraction was in the range of 40% to 45%.  Regional wall motion abnormalities:   There is akinesis of the mid-apicalanteroseptal and apical myocardium. Doppler parameters are consistent with abnormal left ventricular relaxation (grade 1 diastolic dysfunction).  ------------------------------------------------------------------- Aortic valve:   Trileaflet; normal thickness leaflets. Mobility was not restricted.  Doppler:  Transvalvular velocity was within the normal range. There was no stenosis. There was no regurgitation.  ------------------------------------------------------------------- Aorta:  Aortic root: The aortic root was normal in size.  ------------------------------------------------------------------- Mitral valve:   Structurally normal valve.   Mobility was not restricted.  Doppler:  Transvalvular velocity was within the normal range. There was no evidence for stenosis. There was no regurgitation.    Valve area by pressure half-time: 4.07 cm^2. Indexed valve area by pressure half-time: 1.99 cm^2/m^2.  ------------------------------------------------------------------- Left atrium:  The atrium was normal in size.  ------------------------------------------------------------------- Right ventricle:  The cavity size was mildly dilated. Wall thickness was normal. Systolic function was  normal.  ------------------------------------------------------------------- Pulmonic valve:    Doppler:  Transvalvular velocity was within the normal range. There was no evidence for stenosis.  ------------------------------------------------------------------- Tricuspid valve:   Structurally normal valve.    Doppler: Transvalvular velocity was within the normal range. There was trivial regurgitation.  ------------------------------------------------------------------- Pulmonary artery:   The main pulmonary artery was normal-sized. Systolic pressure was within the normal range.  ------------------------------------------------------------------- Right atrium:  The atrium was normal in size.  ------------------------------------------------------------------- Pericardium:  There was no pericardial effusion.  ------------------------------------------------------------------- Systemic veins: Inferior vena cava: The vessel was normal in size.  ------------------------------------------------------------------- Measurements  Left ventricle                         Value          Reference LV ID, ED, PLAX chordal                47.7  mm       43 - 52 LV ID, ES, PLAX chordal                29.8  mm       23 - 38 LV fx shortening, PLAX chordal         38    %        >=29 LV PW thickness, ED                    13.1  mm       ---------- IVS/LV PW ratio, ED  0.95           <=1.3 Stroke volume, 2D                      63    ml       ---------- Stroke volume/bsa, 2D                  31    ml/m^2   ---------- LV e&', lateral                         7.4   cm/s     ---------- LV E/e&', lateral                       8.26           ---------- LV e&', medial                          5.77  cm/s     ---------- LV E/e&', medial                        10.59          ---------- LV e&', average                         6.59  cm/s     ---------- LV E/e&', average                        9.28           ----------  Ventricular septum                     Value          Reference IVS thickness, ED                      12.5  mm       ----------  LVOT                                   Value          Reference LVOT ID, S                             21    mm       ---------- LVOT area                              3.46  cm^2     ---------- LVOT peak velocity, S                  100   cm/s     ---------- LVOT mean velocity, S                  68.2  cm/s     ---------- LVOT VTI, S                            18.2  cm       ---------- LVOT peak gradient, S  4     mm Hg    ----------  Aorta                                  Value          Reference Aortic root ID, ED                     38    mm       ---------- Ascending aorta ID, A-P, S             35    mm       ----------  Left atrium                            Value          Reference LA ID, A-P, ES                         37    mm       ---------- LA ID/bsa, A-P                         1.81  cm/m^2   <=2.2 LA volume, S                           40.3  ml       ---------- LA volume/bsa, S                       19.7  ml/m^2   ---------- LA volume, ES, 1-p A4C                 37.6  ml       ---------- LA volume/bsa, ES, 1-p A4C             18.4  ml/m^2   ---------- LA volume, ES, 1-p A2C                 41    ml       ---------- LA volume/bsa, ES, 1-p A2C             20.1  ml/m^2   ----------  Mitral valve                           Value          Reference Mitral E-wave peak velocity            61.1  cm/s     ---------- Mitral A-wave peak velocity            67.9  cm/s     ---------- Mitral deceleration time               183   ms       150 - 230 Mitral pressure half-time              54    ms       ---------- Mitral E/A ratio, peak                 0.9            ---------- Mitral valve area, PHT, DP  4.07  cm^2     ---------- Mitral valve area/bsa, PHT, DP         1.99  cm^2/m^2  ----------  Pulmonary arteries                     Value          Reference PA pressure, S, DP                     23    mm Hg    <=30  Tricuspid valve                        Value          Reference Tricuspid regurg peak velocity         224   cm/s     ---------- Tricuspid peak RV-RA gradient          20    mm Hg    ----------  Right atrium                           Value          Reference RA ID, S-I, ES, A4C            (H)     53    mm       34 - 49 RA area, ES, A4C               (H)     19.8  cm^2     8.3 - 19.5 RA volume, ES, A/L                     58.6  ml       ---------- RA volume/bsa, ES, A/L                 28.7  ml/m^2   ----------  Systemic veins                         Value          Reference Estimated CVP                          3     mm Hg    ----------  Right ventricle                        Value          Reference TAPSE                                  22.8  mm       ---------- RV pressure, S, DP                     23    mm Hg    <=30 RV s&', lateral, S                      8.81  cm/s     ----------  Legend: (L)  and  (H)  mark values outside specified reference range.  ------------------------------------------------------------------- Prepared and Electronically Authenticated by  Oneil Parchment, M.D. 2019-12-15T15:30:50  ______________________________________________________________________________________________      EKG:   EKG Interpretation Date/Time:  Thursday April 01 2024 14:52:11 EDT Ventricular Rate:  60 PR Interval:  268 QRS Duration:  88 QT Interval:  396 QTC Calculation: 396 R Axis:   -57  Text Interpretation: Sinus rhythm with 1st degree A-V block Left anterior fascicular block Septal infarct (cited on or before 22-Aug-2018) When compared with ECG of 02-Feb-2021 09:47, Questionable change in initial forces of Septal leads Confirmed by Wonda Sharper [phone]) on 04/01/2024 3:00:20 PM    Recent Labs: No results found for  requested labs within last 365 days.  Recent Lipid Panel    Component Value Date/Time   CHOL 101 01/14/2020 0840   TRIG 55 01/14/2020 0840   HDL 41 01/14/2020 0840   CHOLHDL 2.5 01/14/2020 0840   CHOLHDL 3.7 08/23/2018 0330   VLDL 16 08/23/2018 0330   LDLCALC 47 01/14/2020 0840     Risk Assessment/Calculations:                Physical Exam:    VS:  BP 117/70 (BP Location: Right Arm)   Pulse 60   Ht 5' 6 (1.676 m)   Wt 184 lb (83.5 kg)   SpO2 96%   BMI 29.70 kg/m     Wt Readings from Last 3 Encounters:  04/01/24 184 lb (83.5 kg)  11/20/22 182 lb 9.6 oz (82.8 kg)  11/07/21 176 lb (79.8 kg)     GEN:  Well nourished, well developed in no acute distress HEENT: Normal NECK: No JVD; No carotid bruits LYMPHATICS: No lymphadenopathy CARDIAC: RRR, no murmurs, rubs, gallops RESPIRATORY:  Clear to auscultation without rales, wheezing or rhonchi  ABDOMEN: Soft, non-tender, non-distended MUSCULOSKELETAL:  No edema; No deformity  SKIN: Warm and dry NEUROLOGIC:  Alert and oriented x 3 PSYCHIATRIC:  Normal affect   Assessment & Plan Coronary artery disease involving native coronary artery of native heart without angina pectoris Patient is stable with no symptoms of angina.  He has good functional capacity.  He remains on aspirin  for antiplatelet therapy, and ACE inhibitor, beta-blocker, and PCSK9 inhibitor.  He will follow-up in 1 year or sooner if problems arise. Heart failure with mildly reduced ejection fraction (HFmrEF) (HCC) Patient is treated with metoprolol  succinate and lisinopril .  He has no symptoms of heart failure at this time.  Continue current management.  Echo reviewed as above with LVEF 40 to 45% Mixed hyperlipidemia Treated with Repatha through his PCP office.  Excellent response with a cholesterol of 108, HDL 46, LDL 40.  He was experiencing muscle symptoms with statin drugs.            Medication Adjustments/Labs and Tests Ordered: Current medicines  are reviewed at length with the patient today.  Concerns regarding medicines are outlined above.  Orders Placed This Encounter  Procedures   EKG 12-Lead   No orders of the defined types were placed in this encounter.   Patient Instructions  Follow-Up: At [facility], you and your health needs are our priority.  As part of our continuing mission to provide you with exceptional heart care, our providers are all part of one team.  This team includes your primary Cardiologist (physician) and Advanced Practice Providers or APPs (Physician Assistants and Nurse Practitioners) who all work together to provide you with the care you need, when you need it.  Your next appointment:   1 year(s)  Provider:   Sharper Wonda, MD  Signed, Ozell Fell, MD  04/01/2024 5:27 PM    [facility]

## 2024-04-01 NOTE — Patient Instructions (Signed)
 Follow-Up: At [facility], you and your health needs are our priority.  As part of our continuing mission to provide you with exceptional heart care, our providers are all part of one team.  This team includes your primary Cardiologist (physician) and Advanced Practice Providers or APPs (Physician Assistants and Nurse Practitioners) who all work together to provide you with the care you need, when you need it.  Your next appointment:   1 year(s)  Provider:   Arnoldo Lapping, MD

## 2024-04-01 NOTE — Assessment & Plan Note (Signed)
 Patient is stable with no symptoms of angina.  He has good functional capacity.  He remains on aspirin  for antiplatelet therapy, and ACE inhibitor, beta-blocker, and PCSK9 inhibitor.  He will follow-up in 1 year or sooner if problems arise.

## 2024-04-01 NOTE — Assessment & Plan Note (Signed)
 Patient is treated with metoprolol  succinate and lisinopril .  He has no symptoms of heart failure at this time.  Continue current management.  Echo reviewed as above with LVEF 40 to 45%

## 2024-04-01 NOTE — Assessment & Plan Note (Signed)
 Treated with Repatha through his PCP office.  Excellent response with a cholesterol of 108, HDL 46, LDL 40.  He was experiencing muscle symptoms with statin drugs.

## 2024-04-06 ENCOUNTER — Other Ambulatory Visit: Payer: Self-pay | Admitting: Cardiovascular Disease

## 2024-04-15 DIAGNOSIS — N411 Chronic prostatitis: Secondary | ICD-10-CM | POA: Diagnosis not present

## 2024-04-15 DIAGNOSIS — I2510 Atherosclerotic heart disease of native coronary artery without angina pectoris: Secondary | ICD-10-CM | POA: Diagnosis not present

## 2024-04-15 DIAGNOSIS — N410 Acute prostatitis: Secondary | ICD-10-CM | POA: Diagnosis not present

## 2024-04-15 DIAGNOSIS — B356 Tinea cruris: Secondary | ICD-10-CM | POA: Diagnosis not present

## 2024-04-15 DIAGNOSIS — I5022 Chronic systolic (congestive) heart failure: Secondary | ICD-10-CM | POA: Diagnosis not present

## 2024-04-15 DIAGNOSIS — R339 Retention of urine, unspecified: Secondary | ICD-10-CM | POA: Diagnosis not present

## 2024-05-04 DIAGNOSIS — I2510 Atherosclerotic heart disease of native coronary artery without angina pectoris: Secondary | ICD-10-CM | POA: Diagnosis not present

## 2024-05-04 DIAGNOSIS — E7800 Pure hypercholesterolemia, unspecified: Secondary | ICD-10-CM | POA: Diagnosis not present

## 2024-05-04 DIAGNOSIS — B356 Tinea cruris: Secondary | ICD-10-CM | POA: Diagnosis not present

## 2024-05-04 DIAGNOSIS — R7309 Other abnormal glucose: Secondary | ICD-10-CM | POA: Diagnosis not present

## 2024-05-04 DIAGNOSIS — F411 Generalized anxiety disorder: Secondary | ICD-10-CM | POA: Diagnosis not present

## 2024-05-17 DIAGNOSIS — R339 Retention of urine, unspecified: Secondary | ICD-10-CM | POA: Diagnosis not present

## 2024-05-19 DIAGNOSIS — H5203 Hypermetropia, bilateral: Secondary | ICD-10-CM | POA: Diagnosis not present

## 2024-05-20 DIAGNOSIS — R972 Elevated prostate specific antigen [PSA]: Secondary | ICD-10-CM | POA: Diagnosis not present

## 2024-05-20 DIAGNOSIS — N281 Cyst of kidney, acquired: Secondary | ICD-10-CM | POA: Diagnosis not present

## 2024-05-20 DIAGNOSIS — N312 Flaccid neuropathic bladder, not elsewhere classified: Secondary | ICD-10-CM | POA: Diagnosis not present

## 2024-05-20 DIAGNOSIS — N401 Enlarged prostate with lower urinary tract symptoms: Secondary | ICD-10-CM | POA: Diagnosis not present

## 2024-05-26 DIAGNOSIS — D235 Other benign neoplasm of skin of trunk: Secondary | ICD-10-CM | POA: Diagnosis not present

## 2024-05-26 DIAGNOSIS — D225 Melanocytic nevi of trunk: Secondary | ICD-10-CM | POA: Diagnosis not present

## 2024-05-26 DIAGNOSIS — D485 Neoplasm of uncertain behavior of skin: Secondary | ICD-10-CM | POA: Diagnosis not present

## 2024-05-26 DIAGNOSIS — D045 Carcinoma in situ of skin of trunk: Secondary | ICD-10-CM | POA: Diagnosis not present

## 2024-05-26 DIAGNOSIS — B078 Other viral warts: Secondary | ICD-10-CM | POA: Diagnosis not present

## 2024-05-26 DIAGNOSIS — L820 Inflamed seborrheic keratosis: Secondary | ICD-10-CM | POA: Diagnosis not present

## 2024-05-31 ENCOUNTER — Other Ambulatory Visit: Payer: Self-pay | Admitting: Cardiovascular Disease

## 2024-06-26 DIAGNOSIS — Z23 Encounter for immunization: Secondary | ICD-10-CM | POA: Diagnosis not present

## 2024-06-30 DIAGNOSIS — L570 Actinic keratosis: Secondary | ICD-10-CM | POA: Diagnosis not present

## 2024-06-30 DIAGNOSIS — D045 Carcinoma in situ of skin of trunk: Secondary | ICD-10-CM | POA: Diagnosis not present

## 2024-07-14 DIAGNOSIS — R310 Gross hematuria: Secondary | ICD-10-CM | POA: Diagnosis not present
